# Patient Record
Sex: Male | Born: 1955
Health system: Southern US, Community
[De-identification: ages and names within clinical notes are randomized; demographics above are authoritative.]

## PROBLEM LIST (undated history)

## (undated) DIAGNOSIS — F0390 Unspecified dementia without behavioral disturbance: Secondary | ICD-10-CM

## (undated) DIAGNOSIS — F0280 Dementia in other diseases classified elsewhere without behavioral disturbance: Secondary | ICD-10-CM

## (undated) DIAGNOSIS — E039 Hypothyroidism, unspecified: Secondary | ICD-10-CM

## (undated) DIAGNOSIS — E785 Hyperlipidemia, unspecified: Secondary | ICD-10-CM

## (undated) DIAGNOSIS — Q909 Down syndrome, unspecified: Secondary | ICD-10-CM

## (undated) DIAGNOSIS — G309 Alzheimer's disease, unspecified: Secondary | ICD-10-CM

## (undated) DIAGNOSIS — D7589 Other specified diseases of blood and blood-forming organs: Secondary | ICD-10-CM

## (undated) DIAGNOSIS — R0602 Shortness of breath: Secondary | ICD-10-CM

## (undated) HISTORY — PX: TONSILLECTOMY: SUR1361

## (undated) HISTORY — DX: Unspecified dementia, unspecified severity, without behavioral disturbance, psychotic disturbance, mood disturbance, and anxiety: F03.90

## (undated) HISTORY — DX: Shortness of breath: R06.02

## (undated) HISTORY — PX: MULTIPLE TOOTH EXTRACTIONS: SHX2053

## (undated) HISTORY — DX: Dementia in other diseases classified elsewhere, unspecified severity, without behavioral disturbance, psychotic disturbance, mood disturbance, and anxiety: F02.80

## (undated) HISTORY — DX: Other specified diseases of blood and blood-forming organs: D75.89

## (undated) HISTORY — PX: CATARACT EXTRACTION, BILATERAL: SHX1313

## (undated) HISTORY — DX: Hyperlipidemia, unspecified: E78.5

## (undated) HISTORY — DX: Hypothyroidism, unspecified: E03.9

## (undated) HISTORY — DX: Alzheimer's disease, unspecified: G30.9

## (undated) HISTORY — DX: Down syndrome, unspecified: Q90.9

---

## 1998-07-06 ENCOUNTER — Ambulatory Visit (HOSPITAL_COMMUNITY): Admission: RE | Admit: 1998-07-06 | Discharge: 1998-07-06 | Payer: Self-pay | Admitting: Specialist

## 2002-11-24 ENCOUNTER — Emergency Department (HOSPITAL_COMMUNITY): Admission: EM | Admit: 2002-11-24 | Discharge: 2002-11-24 | Payer: Self-pay | Admitting: Emergency Medicine

## 2004-12-19 ENCOUNTER — Emergency Department (HOSPITAL_COMMUNITY): Admission: EM | Admit: 2004-12-19 | Discharge: 2004-12-20 | Payer: Self-pay | Admitting: Emergency Medicine

## 2005-05-08 ENCOUNTER — Ambulatory Visit: Payer: Self-pay | Admitting: Internal Medicine

## 2005-06-26 ENCOUNTER — Ambulatory Visit: Payer: Self-pay | Admitting: Internal Medicine

## 2005-12-03 DIAGNOSIS — D7589 Other specified diseases of blood and blood-forming organs: Secondary | ICD-10-CM

## 2005-12-03 HISTORY — PX: COLONOSCOPY: SHX174

## 2005-12-03 HISTORY — DX: Other specified diseases of blood and blood-forming organs: D75.89

## 2006-06-12 ENCOUNTER — Ambulatory Visit: Payer: Self-pay | Admitting: Internal Medicine

## 2006-07-17 ENCOUNTER — Ambulatory Visit: Payer: Self-pay | Admitting: Gastroenterology

## 2006-07-29 ENCOUNTER — Ambulatory Visit: Payer: Self-pay | Admitting: Internal Medicine

## 2006-08-07 ENCOUNTER — Ambulatory Visit: Payer: Self-pay | Admitting: Gastroenterology

## 2006-08-22 ENCOUNTER — Ambulatory Visit: Payer: Self-pay | Admitting: Internal Medicine

## 2006-11-04 ENCOUNTER — Ambulatory Visit: Payer: Self-pay | Admitting: Internal Medicine

## 2007-06-16 ENCOUNTER — Ambulatory Visit: Payer: Self-pay | Admitting: Internal Medicine

## 2007-06-16 DIAGNOSIS — Q909 Down syndrome, unspecified: Secondary | ICD-10-CM

## 2007-06-16 DIAGNOSIS — E039 Hypothyroidism, unspecified: Secondary | ICD-10-CM | POA: Insufficient documentation

## 2007-06-16 DIAGNOSIS — E785 Hyperlipidemia, unspecified: Secondary | ICD-10-CM | POA: Insufficient documentation

## 2007-06-20 ENCOUNTER — Encounter (INDEPENDENT_AMBULATORY_CARE_PROVIDER_SITE_OTHER): Payer: Self-pay | Admitting: *Deleted

## 2007-06-25 ENCOUNTER — Telehealth (INDEPENDENT_AMBULATORY_CARE_PROVIDER_SITE_OTHER): Payer: Self-pay | Admitting: *Deleted

## 2007-06-27 ENCOUNTER — Encounter (INDEPENDENT_AMBULATORY_CARE_PROVIDER_SITE_OTHER): Payer: Self-pay | Admitting: *Deleted

## 2008-02-17 ENCOUNTER — Encounter: Payer: Self-pay | Admitting: Internal Medicine

## 2008-02-19 ENCOUNTER — Encounter: Payer: Self-pay | Admitting: Internal Medicine

## 2008-03-29 ENCOUNTER — Telehealth (INDEPENDENT_AMBULATORY_CARE_PROVIDER_SITE_OTHER): Payer: Self-pay | Admitting: *Deleted

## 2008-06-09 ENCOUNTER — Ambulatory Visit: Payer: Self-pay | Admitting: Internal Medicine

## 2008-06-16 ENCOUNTER — Telehealth: Payer: Self-pay | Admitting: Internal Medicine

## 2008-06-17 ENCOUNTER — Encounter: Payer: Self-pay | Admitting: Internal Medicine

## 2008-07-21 ENCOUNTER — Telehealth: Payer: Self-pay | Admitting: Internal Medicine

## 2008-07-29 ENCOUNTER — Encounter: Payer: Self-pay | Admitting: Internal Medicine

## 2008-08-02 ENCOUNTER — Encounter: Payer: Self-pay | Admitting: Internal Medicine

## 2008-09-14 ENCOUNTER — Telehealth (INDEPENDENT_AMBULATORY_CARE_PROVIDER_SITE_OTHER): Payer: Self-pay | Admitting: *Deleted

## 2008-09-14 ENCOUNTER — Encounter: Payer: Self-pay | Admitting: Internal Medicine

## 2008-09-17 ENCOUNTER — Telehealth (INDEPENDENT_AMBULATORY_CARE_PROVIDER_SITE_OTHER): Payer: Self-pay | Admitting: *Deleted

## 2008-09-21 ENCOUNTER — Telehealth (INDEPENDENT_AMBULATORY_CARE_PROVIDER_SITE_OTHER): Payer: Self-pay | Admitting: *Deleted

## 2008-09-24 ENCOUNTER — Ambulatory Visit: Payer: Self-pay | Admitting: Internal Medicine

## 2008-09-30 ENCOUNTER — Telehealth (INDEPENDENT_AMBULATORY_CARE_PROVIDER_SITE_OTHER): Payer: Self-pay | Admitting: *Deleted

## 2008-11-16 ENCOUNTER — Ambulatory Visit: Payer: Self-pay | Admitting: Internal Medicine

## 2008-11-19 ENCOUNTER — Telehealth (INDEPENDENT_AMBULATORY_CARE_PROVIDER_SITE_OTHER): Payer: Self-pay | Admitting: *Deleted

## 2008-12-17 ENCOUNTER — Telehealth (INDEPENDENT_AMBULATORY_CARE_PROVIDER_SITE_OTHER): Payer: Self-pay | Admitting: *Deleted

## 2008-12-22 ENCOUNTER — Ambulatory Visit: Payer: Self-pay | Admitting: Internal Medicine

## 2009-06-23 ENCOUNTER — Telehealth (INDEPENDENT_AMBULATORY_CARE_PROVIDER_SITE_OTHER): Payer: Self-pay | Admitting: *Deleted

## 2009-06-24 ENCOUNTER — Encounter (INDEPENDENT_AMBULATORY_CARE_PROVIDER_SITE_OTHER): Payer: Self-pay | Admitting: *Deleted

## 2009-07-01 ENCOUNTER — Ambulatory Visit: Payer: Self-pay | Admitting: Internal Medicine

## 2009-07-01 DIAGNOSIS — R739 Hyperglycemia, unspecified: Secondary | ICD-10-CM

## 2009-07-04 ENCOUNTER — Encounter (INDEPENDENT_AMBULATORY_CARE_PROVIDER_SITE_OTHER): Payer: Self-pay | Admitting: *Deleted

## 2009-09-23 ENCOUNTER — Ambulatory Visit: Payer: Self-pay | Admitting: Family Medicine

## 2010-03-21 ENCOUNTER — Encounter: Payer: Self-pay | Admitting: Internal Medicine

## 2010-07-13 ENCOUNTER — Ambulatory Visit: Payer: Self-pay | Admitting: Internal Medicine

## 2010-11-17 ENCOUNTER — Encounter: Payer: Self-pay | Admitting: Internal Medicine

## 2010-12-31 LAB — CONVERTED CEMR LAB
ALT: 33 units/L (ref 0–53)
ALT: 47 units/L (ref 0–53)
AST: 23 units/L (ref 0–37)
AST: 24 units/L (ref 0–37)
Albumin: 3.4 g/dL — ABNORMAL LOW (ref 3.5–5.2)
Albumin: 3.7 g/dL (ref 3.5–5.2)
Alkaline Phosphatase: 71 units/L (ref 39–117)
Alkaline Phosphatase: 76 units/L (ref 39–117)
Alkaline Phosphatase: 80 units/L (ref 39–117)
BUN: 13 mg/dL (ref 6–23)
Basophils Absolute: 0 10*3/uL (ref 0.0–0.1)
Basophils Relative: 0.2 % (ref 0.0–1.0)
Basophils Relative: 1 % (ref 0–1)
Bilirubin, Direct: 0.1 mg/dL (ref 0.0–0.3)
Bilirubin, Direct: 0.2 mg/dL (ref 0.0–0.3)
CO2: 22 meq/L (ref 19–32)
Calcium: 8.8 mg/dL (ref 8.4–10.5)
Calcium: 9 mg/dL (ref 8.4–10.5)
Chloride: 103 meq/L (ref 96–112)
Cholesterol: 130 mg/dL (ref 0–200)
Cholesterol: 148 mg/dL (ref 0–200)
Cholesterol: 156 mg/dL (ref 0–200)
Creatinine, Ser: 0.95 mg/dL (ref 0.40–1.50)
Creatinine, Ser: 1 mg/dL (ref 0.4–1.5)
Eosinophils Absolute: 0.1 10*3/uL (ref 0.0–0.7)
Glucose, Bld: 93 mg/dL (ref 70–99)
HDL goal, serum: 40 mg/dL
HDL: 38 mg/dL — ABNORMAL LOW (ref >39.0)
HDL: 43.3 mg/dL (ref >39.0)
Hgb A1c MFr Bld: 5.9 % (ref 4.6–6.5)
LDL Cholesterol: 81 mg/dL (ref 0–99)
LDL Cholesterol: 93 mg/dL (ref 0–99)
MCHC: 33.8 g/dL (ref 30.0–36.0)
MCHC: 34 g/dL (ref 30.0–36.0)
MCV: 104.9 fL — ABNORMAL HIGH (ref 78.0–100.0)
Monocytes Absolute: 0.4 10*3/uL (ref 0.1–1.0)
Monocytes Relative: 7.7 % (ref 3.0–11.0)
Monocytes Relative: 9 % (ref 3–12)
Neutrophils Relative %: 69 % (ref 43–77)
PSA: 0.32 ng/mL (ref 0.10–4.00)
RBC: 4.41 M/uL (ref 4.22–5.81)
RBC: 4.45 M/uL (ref 4.22–5.81)
RDW: 14 % (ref 11.5–14.6)
TSH: 2.16 microintl units/mL (ref 0.35–5.50)
Total Bilirubin: 0.6 mg/dL (ref 0.3–1.2)
Total Bilirubin: 1 mg/dL (ref 0.3–1.2)
Total CHOL/HDL Ratio: 4.1
Total Protein: 6.8 g/dL (ref 6.0–8.3)
Total Protein: 7 g/dL (ref 6.0–8.3)
Triglycerides: 62 mg/dL (ref 0–149)
Triglycerides: 71 mg/dL (ref 0.0–149.0)
VLDL: 12 mg/dL (ref 0–40)
VLDL: 14.2 mg/dL (ref 0.0–40.0)
VLDL: 25 mg/dL (ref 0–40)

## 2011-01-04 NOTE — Miscellaneous (Signed)
Summary: Care Plan/[facility]  Care Plan/[facility]   Imported By: Lanelle Bal 03/28/2010 12:49:49  _____________________________________________________________________  External Attachment:    Type:   Image     Comment:   External Document

## 2011-01-04 NOTE — Assessment & Plan Note (Signed)
Summary: YEARLY//PH   Vital Signs:  Patient profile:   55 year old male Height:      61.75 inches Weight:      156.2 pounds BMI:     28.91 Temp:     98.4 degrees F oral Resp:     14 per minute BP sitting:   112 / 68  (left arm) Cuff size:   regular  Vitals Entered By: Shonna Chock CMA (July 13, 2010 11:15 AM) CC: Yearly with fasting labs    CC:  Yearly with fasting labs .  History of Present Illness: Here for Medicare AWV: 1.Risk factors based on Past M, S, F history: Hypothyroidism; Dyslipidemia 2.Physical Activities: see data 3.Depression/mood: issues denied 4.Hearing: whisper heard @6  ft 5.ADL's: no limitations 6.Fall Risk: none 7.Home Safety: no issues ( supervised [address] [facility]) 8.Height, weight, &visual acuity:fitted for new lenses 07/12/2010; old lenses lost; vision grossly intact 9.Counseling: none requested or indicated 10.Labs ordered based on risk factors: see Orders 11.Referral Coordination: none requested 12.  Care Plan: see Instructions 13.Cognitive Assessment: Down's Syndrome; oriented to date;unable to spell "WORLD " backwards; recall poor; mood & affect normal. Hyperlipidemia Follow-Up      This is a 55 year old man who presents for Hyperlipidemia follow-up.  The patient denies muscle aches, GI upset, abdominal pain, flushing, itching, constipation, diarrhea, and fatigue.  The patient denies the following symptoms: chest pain/pressure, exercise intolerance, dypsnea, palpitations, syncope, and pedal edema.  Compliance with medications (by patient report) has been near 100%.  Dietary compliance has been good.  Adjunctive measures currently used by the patient include ASA.    Preventive Screening-Counseling & Management  Alcohol-Tobacco     Alcohol drinks/day: 0     Smoking Status: never  Caffeine-Diet-Exercise     Caffeine use/day: occasional l cola     Diet Comments: regular diet     Does Patient Exercise: yes     Type of exercise: walking        Exercise (avg: min/session): <30     Times/week: 7  Hep-HIV-STD-Contraception     Dental Visit-last 6 months yes     Sun Exposure-Excessive: no  Safety-Violence-Falls     Seat Belt Use: yes     Firearms in the Home: no firearms in the home     Smoke Detectors: yes     Violence in the Home: no risk noted     Sexual Abuse: no     Fall Risk: none      Sexual History:  no relationships.        Drug Use:  never.        Blood Transfusions:  no.        Travel History:  no travel.    Allergies (verified): No Known Drug Allergies  Social History: Caffeine use/day:  occasional l cola Dental Care w/in 6 mos.:  yes Sun Exposure-Excessive:  no Seat Belt Use:  yes Fall Risk:  none Sexual History:  no relationships Drug Use:  never Blood Transfusions:  no  Review of Systems General:  Denies weight loss. Eyes:  Denies blurring, double vision, and vision loss-both eyes. ENT:  Denies difficulty swallowing and hoarseness. GI:  Denies bloody stools, dark tarry stools, and indigestion. Derm:  Denies changes in nail beds, dryness, and hair loss. Neuro:  Denies numbness and tingling. Endo:  Denies cold intolerance, excessive hunger, excessive thirst, excessive urination, and heat intolerance.  Physical Exam  General:  Down's stigmata,in no acute distress; appropriate  and cooperative throughout examination Eyes:  No corneal or conjunctival inflammation noted.No lid lag Mouth:  Oral mucosa and oropharynx without lesions or exudates.  Teeth in good repair. Lips dry Neck:  No deformities, masses, or tenderness noted. Lungs:  Normal respiratory effort, chest expands symmetrically. Lungs are clear to auscultation, no crackles or wheezes. Heart:  regular rhythm, no murmur, no gallop, no rub, no JVD, no HJR, and bradycardia.   Abdomen:  Bowel sounds positive,abdomen soft and non-tender without masses, organomegaly or hernias noted. Extremities:  No clubbing, cyanosis, edema, or deformity  noted with normal full range of motion of all joints.   Digits short Neurologic:  strength normal in all extremities and DTRs symmetrical and normal.   Skin:  Intact without suspicious lesions or rashes. Lipis dry as described Cervical Nodes:  No lymphadenopathy noted Axillary Nodes:  No palpable lymphadenopathy Psych:   normally interactive but demeanor  subdued.     Impression & Recommendations:  Problem # 1:  PREVENTIVE HEALTH CARE (ICD-V70.0)  Orders: [facility] -Subsequent Annual Wellness Visit [phone])  Problem # 2:  HYPERLIPIDEMIA (ICD-272.4)  His updated medication list for this problem includes:    Simvastatin 20 Mg Tabs (Simvastatin) .Marland Kitchen... 1 by mouth qhs  Orders: EKG w/ Interpretation (93000) Venipuncture (46962) TLB-Lipid Panel (80061-LIPID) TLB-Hepatic/Liver Function Pnl (80076-HEPATIC) Specimen Handling (95284)  Problem # 3:  HYPOTHYROIDISM (ICD-244.9)  His updated medication list for this problem includes:    Levothyroxine Sodium 50 Mcg Tabs (Levothyroxine sodium) .Marland Kitchen... 1 by mouth once daily  Orders: Venipuncture (13244) TLB-TSH (Thyroid Stimulating Hormone) (84443-TSH) Specimen Handling (01027)  Problem # 4:  DOWN SYNDROME (ICD-758.0)  Problem # 5:  FASTING HYPERGLYCEMIA (ICD-790.29) PMH of Orders: Venipuncture (25366) TLB-A1C / Hgb A1C (Glycohemoglobin) (83036-A1C)  Complete Medication List: 1)  Levothyroxine Sodium 50 Mcg Tabs (Levothyroxine sodium) .Marland Kitchen.. 1 by mouth once daily 2)  Simvastatin 20 Mg Tabs (Simvastatin) .Marland Kitchen.. 1 by mouth qhs  Patient Instructions: 1)  Please have Administration clarify Standard of Care concerning Colonoscopy Screening for Group Home patients older than 50. 2)  It is important that you exercise regularly at least 20 minutes 5 times a week. If you develop chest pain, have severe difficulty breathing, or feel very tired , stop exercising immediately and seek medical attention. 3)  Take an  81 mg coated Aspirin every  day. Prescriptions: SIMVASTATIN 20 MG  TABS (SIMVASTATIN) 1 by mouth qhs  #90 x 3   Entered and Authorized by:   Marga Melnick MD   Signed by:   Marga Melnick MD on 07/13/2010   Method used:   Print then Give to Patient   RxID:   [id number] LEVOTHYROXINE SODIUM 50 MCG TABS (LEVOTHYROXINE SODIUM) 1 by mouth once daily  #90 x 3   Entered and Authorized by:   Marga Melnick MD   Signed by:   Marga Melnick MD on 07/13/2010   Method used:   Print then Give to Patient   RxID:   [id number]

## 2011-01-04 NOTE — Miscellaneous (Signed)
Summary: Med Orders/[facility]  Med Orders/[facility]   Imported By: Lanelle Bal 11/28/2010 10:11:26  _____________________________________________________________________  External Attachment:    Type:   Image     Comment:   External Document

## 2011-02-21 ENCOUNTER — Encounter: Payer: Self-pay | Admitting: Internal Medicine

## 2011-02-22 ENCOUNTER — Ambulatory Visit (INDEPENDENT_AMBULATORY_CARE_PROVIDER_SITE_OTHER): Payer: Medicare Other | Admitting: Internal Medicine

## 2011-02-22 ENCOUNTER — Encounter: Payer: Self-pay | Admitting: Internal Medicine

## 2011-02-22 VITALS — BP 120/86 | HR 60 | Ht 61.25 in | Wt 161.8 lb

## 2011-02-22 DIAGNOSIS — E039 Hypothyroidism, unspecified: Secondary | ICD-10-CM

## 2011-02-22 DIAGNOSIS — R413 Other amnesia: Secondary | ICD-10-CM

## 2011-02-22 DIAGNOSIS — E785 Hyperlipidemia, unspecified: Secondary | ICD-10-CM

## 2011-02-22 DIAGNOSIS — Q909 Down syndrome, unspecified: Secondary | ICD-10-CM

## 2011-02-22 NOTE — Patient Instructions (Signed)
[name] will be referred to Dr. Chriss Czar, neurologist in [facility]. Copies of the laboratory studies will be sent to  the facility and to Dr. Hyacinth Meeker.

## 2011-02-22 NOTE — Progress Notes (Signed)
  Subjective:    Patient ID: SEDRIC GUIA [facility], male    DOB: 03-24-1956, 55 y.o.   MRN: [id number]  HPI the patient is brought in by the facility staff member , Claris Gower , who relates concerns by the staff as to Billy's mental status . The staff is concerned that this may represent expected aging complications of his Down's syndrome.  They feel for at least the last month at times he's actually seemed "lost" & will ask the same question  Repeatedly. Bili denies these symptoms.   He has had no head trauma and there have been no new localizing neurologic signs according staff member.   He has hypothyroidism and is on levothyroxin. Additionally he is on simvastatin for dyslipidemia    Review of Systems he denies headaches; numbness and tingling; weakness; tremors; or falling. He denies anxiety, depression, or mood swings.     Objective:   Physical Exam he exhibits the classic physical  appearance  of Down syndrome.    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 It is difficult to assess extraocular motion because of difficulty following commands. He does have intermittent nystagmus greater on the right than the left.    he has an eczematoid dermatitis with mild erythema and drying  Especially noted of the face.    heart rhythm is regular; he has a grade 1/2-1 systolic murmur. No carotid bruits are noted.   deep tendon reflexes are equal and normal. It is difficult to assess strength as effort is questionable.    his gait is slow and methodical. It is slightly broad-based.    there is no lymphadenopathy about the head neck or axilla. Thyroid is normal to palpation.    affect is flat. This is in direct contrast to his previous demeanor which was interactive and jovial. In the past he is typically had a puzzle book or other interactive material  which he completes during the interview.    Mini-Mental Status exam was completed ; he scored 13/30. He was unable to give the date.  There was no immediate recall of 3 items. He could not subtract 7 from 100. He could not repeat the phrase "no if ands or buts ".    when written instructions to close his eyes were shown to him he read it but did not comply. When asked to write a sentence he wrote "test ".    he was unable to draw intersecting pentagons.       Assessment & Plan:   #1 probable dementia     #2 Down syndrome    #3 hypothyroidism     #4 hyperlipidemia    Plan : #1 labs will be drawn to assess the dementia ; he will be referred to neurology.   #2 lipids , ALT, and AST will be collected

## 2011-02-23 LAB — HEPATIC FUNCTION PANEL
AST: 26 U/L (ref 0–37)
Albumin: 3.7 g/dL (ref 3.5–5.2)
Total Bilirubin: 0.4 mg/dL (ref 0.3–1.2)

## 2011-02-23 LAB — LIPID PANEL
HDL: 52.9 mg/dL (ref >39.00)
LDL Cholesterol: 68 mg/dL (ref 0–99)
Total CHOL/HDL Ratio: 3

## 2011-02-23 LAB — RPR

## 2011-02-23 LAB — TSH: TSH: 1.85 u[IU]/mL (ref 0.35–5.50)

## 2011-02-26 LAB — METHYLMALONIC ACID, SERUM: Methylmalonic Acid, Quantitative: 93 nmol/L (ref 87–318)

## 2011-02-28 ENCOUNTER — Telehealth: Payer: Self-pay

## 2011-02-28 NOTE — Telephone Encounter (Signed)
This level is extremely low although some just barely in the normal range. I would recommend B12 injections 1000 units weekly x4 then monthly thereafter. This level could be repeaed after 6 months of supplementation. (Reason for Call), unable to leave message- will try again later

## 2011-02-28 NOTE — Telephone Encounter (Signed)
Left message on voicemail for patient to return call when avaliable. Reason for call- discuss labs and set up appointment for b12 injection if patient agrees

## 2011-03-01 NOTE — Telephone Encounter (Signed)
Patient's care giver Gwyndolyn Saxon called and was given information about labs. Drinda Butts will check with her supervisor and see when she will be able to bring patient in for his b12 injections and call us back

## 2011-03-06 ENCOUNTER — Ambulatory Visit (INDEPENDENT_AMBULATORY_CARE_PROVIDER_SITE_OTHER): Payer: Medicare Other | Admitting: *Deleted

## 2011-03-06 DIAGNOSIS — E538 Deficiency of other specified B group vitamins: Secondary | ICD-10-CM

## 2011-03-06 MED ORDER — CYANOCOBALAMIN 1000 MCG/ML IJ SOLN
1000.0000 ug | Freq: Once | INTRAMUSCULAR | Status: AC
  Administered 2011-03-06: 1000 ug via INTRAMUSCULAR

## 2011-03-15 ENCOUNTER — Ambulatory Visit (INDEPENDENT_AMBULATORY_CARE_PROVIDER_SITE_OTHER): Payer: Medicare Other | Admitting: *Deleted

## 2011-03-15 DIAGNOSIS — E538 Deficiency of other specified B group vitamins: Secondary | ICD-10-CM

## 2011-03-15 MED ORDER — CYANOCOBALAMIN 1000 MCG/ML IJ SOLN
1000.0000 ug | Freq: Once | INTRAMUSCULAR | Status: AC
  Administered 2011-03-15: 1000 ug via INTRAMUSCULAR

## 2011-03-22 ENCOUNTER — Ambulatory Visit (INDEPENDENT_AMBULATORY_CARE_PROVIDER_SITE_OTHER): Payer: Medicare Other | Admitting: *Deleted

## 2011-03-22 DIAGNOSIS — E538 Deficiency of other specified B group vitamins: Secondary | ICD-10-CM

## 2011-03-22 MED ORDER — CYANOCOBALAMIN 1000 MCG/ML IJ SOLN
1000.0000 ug | Freq: Once | INTRAMUSCULAR | Status: AC
  Administered 2011-03-22: 1000 ug via INTRAMUSCULAR

## 2011-03-29 ENCOUNTER — Ambulatory Visit (INDEPENDENT_AMBULATORY_CARE_PROVIDER_SITE_OTHER): Payer: Medicare Other | Admitting: *Deleted

## 2011-03-29 DIAGNOSIS — E538 Deficiency of other specified B group vitamins: Secondary | ICD-10-CM

## 2011-03-29 MED ORDER — CYANOCOBALAMIN 1000 MCG/ML IJ SOLN
1000.0000 ug | Freq: Once | INTRAMUSCULAR | Status: AC
  Administered 2011-03-29: 1000 ug via INTRAMUSCULAR

## 2011-04-20 NOTE — Assessment & Plan Note (Signed)
[facility]                           GASTROENTEROLOGY OFFICE NOTE   DAMARCUS, REGGIO                    MRN:          [id number]  DATE:07/17/2006                            DOB:          October 26, 1956    REFERRING PHYSICIAN:  Dr. Marga Melnick.   REASON FOR REFERRAL:  Colorectal cancer screening.   HISTORY OF PRESENT ILLNESS:  [name] is a 55 year old white male who  is a resident of a group home.  He has no gastrointestinal complaints and  specifically denies any change in his bowel habits, melena, hematochezia,  change in stool caliber, constipation, diarrhea or weight loss.  There is no  family history of colon cancer, colon polyps or inflammatory bowel disease.  He has recently seen Dr. Alwyn Ren for a physical examination and he was  recommended to have a colonoscopy.  Digital rectal examination was  unremarkable with hemoccult negative stool.   LABORATORY DATA:  Blood work from June 12, 2006 showed a slightly low white  count of 4.3, an elevated MCV at 104.  Minimally elevated glucose at 106 and  a normal PSA.   PAST MEDICAL HISTORY:  1. Hyperlipidemia.  2. Hypothyroidism.  3. Down's Syndrome.   PAST SURGICAL HISTORY:  Negative.   CURRENT MEDICATIONS:  Listed on the chart, updated and reviewed.   ALLERGIES:  No known drug allergies.   SOCIAL HISTORY:  Per the hand written-form.   REVIEW OF SYSTEMS:  Entirely negative per the hand-written form.   PHYSICAL EXAMINATION:  GENERAL:  Well-developed, well-nourished white male  with typical features of Down's Syndrome.  VITAL SIGNS:  Height 5 feet.  Weight 174.2.  Blood pressure 134/82.  Pulse  84 and regular.  HEENT:  Anicteric sclerae.  Oropharynx clear.  CHEST:  Clear to auscultation bilaterally.  CARDIAC:  Regular rate and rhythm without murmurs.  ABDOMEN:  Soft, non-tender, non-distended.  Normal active bowel sounds.  No  palpable organomegaly, masses or hernias.  RECTAL:   Rectal examination deferred to time of colonoscopy, recent exam by  Dr. Alwyn Ren showed no lesions with hemoccult negative stool.  EXTREMITIES:  No clubbing, cyanosis or edema.  NEUROLOGICAL:  Alert and oriented x3.  Grossly non-focal.   ASSESSMENT:  1. Average risk for colorectal cancer.  2. Risks, benefits and alternatives to colonoscopy with possible biopsy      and possible polypectomy discussed with the patient and he consents to      proceed.  3. This will be scheduled electively.                                   Venita Lick. Pleas Koch., MD, [facility]   MTS/MedQ  DD:  07/17/2006  DT:  07/17/2006  Job #:  [id number]   cc:   Titus Dubin. Alwyn Ren, MD, FACP, [facility]

## 2011-04-20 NOTE — Assessment & Plan Note (Signed)
[facility]                                   ON-CALL NOTE   Travis Sampson, Travis Sampson                   MRN:          [id number]  DATE:07/28/2006                            DOB:          Jul 18, 1956    TIME CALLED:  2:21 p.m.   TELEPHONE NUMBER:  [phone]   CHIEF COMPLAINT:  Painful and swollen foot.   I tried this phone number several times.  It was not in operation.  I called  the answering service and told them to please redirect the call with a  different number to me if they call back and they did not.                                   Marne A. Tower, MD   MAT/MedQ  DD:  07/29/2006  DT:  07/29/2006  Job #:  [id number]   cc:   Titus Dubin. Alwyn Ren, MD, FACP, [facility]

## 2011-05-01 ENCOUNTER — Ambulatory Visit (INDEPENDENT_AMBULATORY_CARE_PROVIDER_SITE_OTHER): Payer: Medicare Other | Admitting: *Deleted

## 2011-05-01 DIAGNOSIS — E538 Deficiency of other specified B group vitamins: Secondary | ICD-10-CM

## 2011-05-01 MED ORDER — CYANOCOBALAMIN 1000 MCG/ML IJ SOLN
1000.0000 ug | Freq: Once | INTRAMUSCULAR | Status: AC
  Administered 2011-05-01: 1000 ug via INTRAMUSCULAR

## 2011-06-27 DIAGNOSIS — Z0279 Encounter for issue of other medical certificate: Secondary | ICD-10-CM

## 2011-07-13 ENCOUNTER — Encounter (INDEPENDENT_AMBULATORY_CARE_PROVIDER_SITE_OTHER): Payer: Medicare Other | Admitting: *Deleted

## 2011-07-13 ENCOUNTER — Ambulatory Visit (INDEPENDENT_AMBULATORY_CARE_PROVIDER_SITE_OTHER): Payer: Medicare Other | Admitting: Family Medicine

## 2011-07-13 ENCOUNTER — Encounter: Payer: Self-pay | Admitting: Family Medicine

## 2011-07-13 VITALS — BP 128/80 | Temp 98.0°F | Wt 158.0 lb

## 2011-07-13 DIAGNOSIS — R600 Localized edema: Secondary | ICD-10-CM | POA: Insufficient documentation

## 2011-07-13 DIAGNOSIS — M79609 Pain in unspecified limb: Secondary | ICD-10-CM

## 2011-07-13 DIAGNOSIS — R609 Edema, unspecified: Secondary | ICD-10-CM

## 2011-07-13 DIAGNOSIS — M7989 Other specified soft tissue disorders: Secondary | ICD-10-CM

## 2011-07-13 NOTE — Assessment & Plan Note (Signed)
Pt's unilateral edema is concerning for DVT.  Get [place] to r/o.  Reviewed supportive care and red flags that should prompt return.  Pt expressed understanding and is in agreement w/ plan.

## 2011-07-13 NOTE — Progress Notes (Signed)
  Subjective:    Patient ID: Travis Sampson [facility], male    DOB: June 01, 1956, 55 y.o.   MRN: [id number]  HPI R leg swelling- first noticed 'a few days ago'.  Less swollen today.  Pt reports pain w/ swelling if weight bearing.  Some limited mobility.  No recent injury.  No CP, SOB.   Review of Systems For ROS see HPI     Objective:   Physical Exam  Vitals reviewed. Constitutional: He appears well-developed and well-nourished. No distress.  Cardiovascular: Normal rate, regular rhythm, normal heart sounds and intact distal pulses.   Pulmonary/Chest: Effort normal and breath sounds normal. No respiratory distress. He has no wheezes. He has no rales.  Musculoskeletal: He exhibits edema (R LE edema from mid shin down- +1, no edema on L). He exhibits no tenderness.  Skin: Skin is warm and dry. No erythema.          Assessment & Plan:

## 2011-07-13 NOTE — Patient Instructions (Signed)
We'll notify you of your ultrasound result Make sure you ice and elevate your leg to help with the swelling If the swelling continues into next week- please call for a follow up appt Call with any questions or concerns Hang in there!

## 2011-07-16 ENCOUNTER — Encounter: Payer: Self-pay | Admitting: Family Medicine

## 2011-07-18 ENCOUNTER — Ambulatory Visit: Payer: Medicare Other | Admitting: Family Medicine

## 2011-07-18 ENCOUNTER — Ambulatory Visit (INDEPENDENT_AMBULATORY_CARE_PROVIDER_SITE_OTHER): Payer: Medicare Other | Admitting: Family Medicine

## 2011-07-18 ENCOUNTER — Encounter: Payer: Self-pay | Admitting: Family Medicine

## 2011-07-18 VITALS — BP 134/84 | Temp 98.6°F | Wt 158.4 lb

## 2011-07-18 DIAGNOSIS — R609 Edema, unspecified: Secondary | ICD-10-CM

## 2011-07-18 DIAGNOSIS — R600 Localized edema: Secondary | ICD-10-CM

## 2011-07-18 LAB — BASIC METABOLIC PANEL
BUN: 12 mg/dL (ref 6–23)
CO2: 29 mEq/L (ref 19–32)
Calcium: 8.6 mg/dL (ref 8.4–10.5)
Chloride: 107 mEq/L (ref 96–112)
Creatinine, Ser: 0.9 mg/dL (ref 0.4–1.5)
Glucose, Bld: 93 mg/dL (ref 70–99)

## 2011-07-18 MED ORDER — FUROSEMIDE 20 MG PO TABS: 20.0000 mg | ORAL_TABLET | Freq: Every day | ORAL | Status: DC

## 2011-07-18 NOTE — Progress Notes (Signed)
  Subjective:    Patient ID: DRAVON NOTT [facility], male    DOB: 1956/02/10, 55 y.o.   MRN: [id number]  HPI Edema- R leg remains swollen despite elevation and ice.  [place] last week was negative for DVT.  No redness or warmth to the leg.  Pt reports it is painful when standing.  No swelling of L leg.  Denies SOB, CP.   Review of Systems For ROS see HPI     Objective:   Physical Exam Vitals reviewed. Constitutional: He appears well-developed and well-nourished. No distress.  Cardiovascular: Normal rate, regular rhythm, normal heart sounds and intact distal pulses.   Pulmonary/Chest: Effort normal and breath sounds normal. No respiratory distress. He has no wheezes. He has no rales.  Musculoskeletal: He exhibits edema (R LE edema from mid shin down- +1, no edema on L). He exhibits no tenderness.  Skin: Skin is warm and dry. No erythema.         Assessment & Plan:

## 2011-07-18 NOTE — Patient Instructions (Signed)
Please schedule w/ Dr Alwyn Ren in 1-2 weeks to recheck labs and swelling Start the Lasix daily for swelling Continue to elevate as much as possible You can continue your normal routine Call with any questions or concerns Hang in there!

## 2011-07-19 ENCOUNTER — Telehealth: Payer: Self-pay

## 2011-07-19 NOTE — Telephone Encounter (Signed)
Pt.notified

## 2011-07-19 NOTE — Telephone Encounter (Signed)
Message copied by Beverely Low on Thu Jul 19, 2011 10:52 AM ------      Message from: Sheliah Hatch      Created: Wed Jul 18, 2011  4:50 PM       Pt's Cr and K+ are normal.  Should start lasix as discussed.  Will need repeat BMP at f/u w/ Dr Alwyn Ren

## 2011-07-22 NOTE — Assessment & Plan Note (Signed)
Exam is unchanged from Friday.  Doppler was negative for DVT.  No signs of CHF- no pulmonary congestion, bilateral swelling, no SOB.  Start Lasix.  Pt to return to evaluate edema and recheck BMP after starting lasix.  Pt and caregiver express understanding.

## 2011-07-23 ENCOUNTER — Telehealth: Payer: Self-pay

## 2011-07-23 NOTE — Telephone Encounter (Signed)
Caregiver Drinda Butts states that pt is already taking Lasix and it was received from the pharmacy on file. Disregard fax from pharmacy

## 2011-08-01 ENCOUNTER — Ambulatory Visit (INDEPENDENT_AMBULATORY_CARE_PROVIDER_SITE_OTHER): Payer: Medicare Other | Admitting: Internal Medicine

## 2011-08-01 ENCOUNTER — Encounter: Payer: Self-pay | Admitting: Internal Medicine

## 2011-08-01 VITALS — BP 112/78 | Temp 98.5°F | Wt 154.0 lb

## 2011-08-01 DIAGNOSIS — R600 Localized edema: Secondary | ICD-10-CM

## 2011-08-01 DIAGNOSIS — R609 Edema, unspecified: Secondary | ICD-10-CM

## 2011-08-01 NOTE — Patient Instructions (Addendum)
Wear the support hose on the right leg. If the swelling persists or progresses; assessment by Orthopedics for possible musculoskeletal injury is recommended. Avoid ingestion of  excess salt/sodium.Cook with pepper & other spices . Use the salt substitute "No Salt"(unless your potassium has been elevated) OR the Mrs Sharilyn Sites products to season food @ the table. Avoid foods which taste salty or "vinegary" as their sodium contentet will be high.

## 2011-08-01 NOTE — Progress Notes (Signed)
  Subjective:    Patient ID: Travis Sampson [facility], male    DOB: 01-25-1956, 55 y.o.   MRN: [id number]  HPI The June 15 record and labs were reviewed; renal function was normal and there was no evidence of deep venous thrombosis. The edema has improved with low dose prednisone, 20 mg daily. He denies excess salt intake. He is not on amlodipine.    Review of Systems   He denies chest pain, palpitations, paroxysmal nocturnal dyspnea. He did have a fall @ camp but  this predated the edema in the right ankle by 5 months.     Objective:   Physical Exam he is in no acute distress.  Chest is clear without rales, rhonchi, or wheezes  He has a slow S4 with slight slurring. No significant murmurs.  There is no neck vein distention at 30. He has no hepatojugular reflux.  Abdomen is nontender without organomegaly or masses.  All pulses are intact no bruits or ischemic changes.  Stasis dermatitis changes or noted over the right shin and he does have slight edema/fusiform change of the right ankle  He denies pain to palpation or range of motion of the right ankle and foot        Assessment & Plan:  #1 he has localized edema of the right ankle and foot which is non-pitting. There is no suggestion of a cardiac, renal, or hepatic cause of this. These changes are most likely related to venous insufficiency due to previous muscle skeletal injury in the R ankle/ foot.  Plan: Support hose for  the right lower extremity is recommended. If symptoms persist or progress, I recommend orthopedic assessment of the right ankle/foot.

## 2011-09-07 ENCOUNTER — Telehealth: Payer: Self-pay | Admitting: *Deleted

## 2011-09-07 NOTE — Telephone Encounter (Signed)
Pt was given Rx for support hose to help with swelling in legs. Pt swelling has resolve and Pt does not want to wear hose anymore. Facility would like to know if Pt is to continue to wear hose despite swelling resolving or if it can be used on a as needed basis. Order needed to D/C hose or to PRN. Please advise

## 2011-09-07 NOTE — Telephone Encounter (Signed)
Leave the stockings off unless the edema recurs. They can be reapplied as needed

## 2011-09-07 NOTE — Telephone Encounter (Signed)
Verbally inform Arlys John of dr hopper orders

## 2011-09-27 ENCOUNTER — Ambulatory Visit (INDEPENDENT_AMBULATORY_CARE_PROVIDER_SITE_OTHER): Payer: Medicare Other | Admitting: Internal Medicine

## 2011-09-27 ENCOUNTER — Encounter: Payer: Self-pay | Admitting: Internal Medicine

## 2011-09-27 VITALS — BP 122/80 | HR 53 | Temp 98.6°F | Resp 12 | Ht 61.25 in | Wt 157.8 lb

## 2011-09-27 DIAGNOSIS — R9431 Abnormal electrocardiogram [ECG] [EKG]: Secondary | ICD-10-CM

## 2011-09-27 DIAGNOSIS — Q909 Down syndrome, unspecified: Secondary | ICD-10-CM

## 2011-09-27 DIAGNOSIS — Z23 Encounter for immunization: Secondary | ICD-10-CM

## 2011-09-27 DIAGNOSIS — Z0000 Encounter for general adult medical examination without abnormal findings: Secondary | ICD-10-CM

## 2011-09-27 DIAGNOSIS — Z79899 Other long term (current) drug therapy: Secondary | ICD-10-CM

## 2011-09-27 DIAGNOSIS — E785 Hyperlipidemia, unspecified: Secondary | ICD-10-CM

## 2011-09-27 DIAGNOSIS — E039 Hypothyroidism, unspecified: Secondary | ICD-10-CM

## 2011-09-27 DIAGNOSIS — R7309 Other abnormal glucose: Secondary | ICD-10-CM

## 2011-09-27 NOTE — Progress Notes (Signed)
Subjective:    Patient ID: Travis Sampson [facility], male    DOB: [birth year], 55 y.o.   MRN: [id number]  HPI  Travis Sampson is here for a physical; he has no acute issues.      Review of Systems  Family history of premature CAD/ MI: no .  Nutrition: no diet .  Exercise: occasionally . Diabetes : PMH of fasting glycemia . No polyuria, phagia or uria. HTN: no.  Weight :  stable. ROS: fatigue: no ; chest pain : no ;claudication: no; palpitations: no; abd pain/bowel changes: no ; myalgias:no;  syncope : no ; skin changes: no.       Objective:   Physical Exam Gen.: Healthy and well-nourished in appearance. Alert, appropriate and cooperative throughout exam. Short stature & stigmata of Down's Head: Normocephalic without obvious abnormalities Eyes: No corneal or conjunctival inflammation noted. Vision grossly normal with lenses. Ptosis bilaterally Ears: External  ear exam reveals no significant lesions or deformities. Canals clear .TMs normal. Hearing is grossly normal bilaterally. Nose: External nasal exam reveals no deformity or inflammation. Nasal mucosa are pink and moist. No lesions or exudates noted.   Mouth: Oral mucosa and oropharynx reveal no lesions or exudates. Teeth in good repair. Neck: No deformities, masses, or tenderness noted.  Thyroid normal. Some webbing. Lungs: Normal respiratory effort; chest expands symmetrically. Lungs are clear to auscultation without rales, wheezes, or increased work of breathing. Heart: Normal rate and rhythm. Normal S1 and S2. No gallop, click, or rub. S 4 with slurring; no significant murmur. Abdomen: Bowel sounds normal; abdomen soft and nontender. No masses, organomegaly or hernias noted. Genitalia/DRE : genital exam negative.No DRE ; PSA has been < 1.0 ( 0.57 in 2006 & 0.84 in 2007)   .                                                                                   Musculoskeletal/extremities: No deformity or scoliosis noted of  the thoracic or lumbar  spine. No clubbing, cyanosis, edema, or deformity noted. Range of motion  normal .Tone & strength  normal.Joints normal. Nail health  good. Vascular: Carotid, radial artery, dorsalis pedis and  posterior tibial pulses are full and equal. No bruits present. Neurologic: Alert and oriented x3. Deep tendon reflexes symmetrical and normal.          Skin: Intact without suspicious lesions or rashes, but some facial drying. Lymph: No cervical, axillary, or inguinal lymphadenopathy present. Psych: Mood and affect are normal. Normally interactive                                                                                         Assessment & Plan:  #1 comprehensive physical exam; no acute findings #2 see Problem List with Assessments & Recommendations EKG:SB, loss of T voltage in  lateral leads. Cardiology  assessment needed because of age, Down's Syndrome Plan: see Orders

## 2011-09-27 NOTE — Patient Instructions (Addendum)
Preventive Health Care: Exercise at least 30-45 minutes a day,  3-4 days a week once cleared by the Cardiologist.  Eat a low-fat diet with lots of fruits and vegetables, up to 7-9 servings per day. Consume less than 40 grams of sugar per day from foods & drinks with High Fructose Corn Sugar as # 1,2,3 or # 4 on label. Use Eucerin or another moisturizing agent such as Aveeno daily Moisturizing Lotion  twice a day  for the drying of the skin.

## 2011-09-28 LAB — BASIC METABOLIC PANEL
BUN: 12 mg/dL (ref 6–23)
Calcium: 8.5 mg/dL (ref 8.4–10.5)
Creatinine, Ser: 1 mg/dL (ref 0.4–1.5)
GFR: 85.87 mL/min (ref >60.00)
Glucose, Bld: 84 mg/dL (ref 70–99)
Potassium: 3.9 mEq/L (ref 3.5–5.1)

## 2011-09-28 LAB — CBC WITH DIFFERENTIAL/PLATELET
Basophils Absolute: 0 10*3/uL (ref 0.0–0.1)
Eosinophils Relative: 1.9 % (ref 0.0–5.0)
HCT: 45.9 % (ref 39.0–52.0)
Lymphs Abs: 1 10*3/uL (ref 0.7–4.0)
Monocytes Relative: 20.3 % — ABNORMAL HIGH (ref 3.0–12.0)
Neutrophils Relative %: 46.6 % (ref 43.0–77.0)
Platelets: 179 10*3/uL (ref 150.0–400.0)
RDW: 14.9 % — ABNORMAL HIGH (ref 11.5–14.6)
WBC: 3.2 10*3/uL — ABNORMAL LOW (ref 4.5–10.5)

## 2011-09-28 LAB — LIPID PANEL
Cholesterol: 136 mg/dL (ref 0–200)
Triglycerides: 69 mg/dL (ref 0.0–149.0)
VLDL: 13.8 mg/dL (ref 0.0–40.0)

## 2011-09-28 LAB — HEPATIC FUNCTION PANEL
ALT: 20 U/L (ref 0–53)
Total Bilirubin: 0.6 mg/dL (ref 0.3–1.2)

## 2011-09-28 LAB — TSH: TSH: 1.35 u[IU]/mL (ref 0.35–5.50)

## 2011-09-28 LAB — HEMOGLOBIN A1C: Hgb A1c MFr Bld: 6 % (ref 4.6–6.5)

## 2011-10-04 ENCOUNTER — Institutional Professional Consult (permissible substitution): Payer: Medicare Other | Admitting: Cardiology

## 2011-10-16 ENCOUNTER — Ambulatory Visit (INDEPENDENT_AMBULATORY_CARE_PROVIDER_SITE_OTHER): Payer: Medicare Other | Admitting: Cardiovascular Disease

## 2011-10-16 ENCOUNTER — Encounter: Payer: Self-pay | Admitting: Cardiovascular Disease

## 2011-10-16 VITALS — BP 136/84 | HR 60 | Ht 60.0 in | Wt 157.1 lb

## 2011-10-16 DIAGNOSIS — R9431 Abnormal electrocardiogram [ECG] [EKG]: Secondary | ICD-10-CM

## 2011-10-16 NOTE — Progress Notes (Signed)
[name] [facility] Date of Birth  [birth year] [facility] [address]    [address] [place], [place]  [address] [phone]  Fax  [phone]  History of Present Illness:  Travis Sampson is a 13 old gentleman with a history of Downs syndrome,  Hypertension,and hypothyroidism who is referred here with an abnormal EKG. He's not had any complaints. He's able to do all of his normal activities without any chest pain or shortness of breath. He's not all that active but he does work at [employer] as a Holiday representative.  Review the EKG reveals that he has sinus bradycardia. He had slight T-wave flattening on a few beats of the anterior leads but in subsequent beats the EKG tracing is normal.   Current Outpatient Prescriptions on File Prior to Visit  Medication Sig Dispense Refill  . aspirin 81 MG tablet Take 81 mg by mouth daily.        Marland Kitchen levothyroxine (SYNTHROID, LEVOTHROID) 50 MCG tablet Take 50 mcg by mouth daily.        . simvastatin (ZOCOR) 20 MG tablet Take 20 mg by mouth at bedtime.          No Known Allergies  Past Medical History  Diagnosis Date  . Down's syndrome   . Hyperlipidemia   . Hypothyroidism   . Macrocytosis 2007    MCV 104    Past Surgical History  Procedure Date  . Cataract extraction, bilateral   . Tonsillectomy   . Colonoscopy 2007    internal hemorrhoids    History  Smoking status  . Never Smoker   Smokeless tobacco  . Not on file    History  Alcohol Use No    Family History  Problem Relation Age of Onset  . Stroke Father   . Lung cancer Sister   . Heart attack Neg Hx     Reviw of Systems:  Reviewed in the HPI.  All other systems are negative.  Physical Exam: BP 136/84  Pulse 60  Ht 5' (1.524 m)  Wt 157 lb 1.9 oz (71.269 kg)  BMI 30.69 kg/m2 The patient is alert and oriented x 3.  The mood and affect are normal.   Skin: warm and dry.  Color is normal.    HEENT:   [place] /AT Downs appearance, normal carotids, no JVD  Lungs: clear    Heart: RR, no murmurs    Abdomen: non tender , + BS  Extremities:  No c/c/e  Neuro:  C/W Down's   O/W non focal  ECG: Sinus brady, mild T wave abn.  Assessment / Plan:

## 2011-10-16 NOTE — Assessment & Plan Note (Signed)
His EKG is overall only minimally abnormal. I think that it actually is within normal limits and I do not think that he needs any additional follow up. He's not having any cardiac complaints. I seen again on as-needed basis.

## 2011-10-16 NOTE — Patient Instructions (Signed)
Your physician recommends that you schedule a follow-up appointment in: AS NEEDED BASIS  

## 2012-01-29 ENCOUNTER — Other Ambulatory Visit: Payer: Self-pay

## 2012-01-29 ENCOUNTER — Encounter (HOSPITAL_BASED_OUTPATIENT_CLINIC_OR_DEPARTMENT_OTHER): Payer: Self-pay | Admitting: Emergency Medicine

## 2012-01-29 ENCOUNTER — Emergency Department (HOSPITAL_BASED_OUTPATIENT_CLINIC_OR_DEPARTMENT_OTHER)
Admission: EM | Admit: 2012-01-29 | Discharge: 2012-01-29 | Disposition: A | Discharge status: Home / Self Care | Payer: Medicare Other | Attending: Emergency Medicine | Admitting: Emergency Medicine

## 2012-01-29 DIAGNOSIS — E785 Hyperlipidemia, unspecified: Secondary | ICD-10-CM | POA: Insufficient documentation

## 2012-01-29 DIAGNOSIS — Q909 Down syndrome, unspecified: Secondary | ICD-10-CM | POA: Insufficient documentation

## 2012-01-29 DIAGNOSIS — R55 Syncope and collapse: Secondary | ICD-10-CM | POA: Insufficient documentation

## 2012-01-29 DIAGNOSIS — R197 Diarrhea, unspecified: Secondary | ICD-10-CM | POA: Insufficient documentation

## 2012-01-29 DIAGNOSIS — E039 Hypothyroidism, unspecified: Secondary | ICD-10-CM | POA: Insufficient documentation

## 2012-01-29 LAB — COMPREHENSIVE METABOLIC PANEL
ALT: 16 U/L (ref 0–53)
Alkaline Phosphatase: 91 U/L (ref 39–117)
CO2: 22 mEq/L (ref 19–32)
GFR calc Af Amer: >90 mL/min (ref >90)
GFR calc non Af Amer: 82 mL/min — ABNORMAL LOW (ref >90)
Glucose, Bld: 114 mg/dL — ABNORMAL HIGH (ref 70–99)
Potassium: 4.1 mEq/L (ref 3.5–5.1)
Sodium: 136 mEq/L (ref 135–145)

## 2012-01-29 LAB — DIFFERENTIAL
Lymphocytes Relative: 7 % — ABNORMAL LOW (ref 12–46)
Lymphs Abs: 0.4 10*3/uL — ABNORMAL LOW (ref 0.7–4.0)
Neutrophils Relative %: 78 % — ABNORMAL HIGH (ref 43–77)

## 2012-01-29 LAB — CBC
Platelets: 203 10*3/uL (ref 150–400)
RBC: 4.43 MIL/uL (ref 4.22–5.81)
WBC: 6.4 10*3/uL (ref 4.0–10.5)

## 2012-01-29 NOTE — ED Notes (Addendum)
Pt to ED via EMS from work- pt was found to be sleeping at work, which is unusual per staff; pt's c/o diarrhea today; possible syncopal episode (pt poor historian); pt incontinent of large amt of loose stool on arrival to ED

## 2012-01-29 NOTE — ED Provider Notes (Signed)
History     CSN: [id number]  Arrival date & time 01/29/12  1045   First MD Initiated Contact with Patient 01/29/12 1135      Chief Complaint  Patient presents with  . Diarrhea  . Near Syncope    (Consider location/radiation/quality/duration/timing/severity/associated sxs/prior treatment) HPI Comments: Patient with history of Down's.  Brought by caregivers after an episode that occurred this morning.  Patient was found lying on the floor in a fetal position snoring.  He was difficult to arouse initially but now is back to baseline.  He denies any pain or any complaints.  The caregivers say that he didn't get much sleep last night.  The history is provided by the patient.    Past Medical History  Diagnosis Date  . Down's syndrome   . Hyperlipidemia   . Hypothyroidism   . Macrocytosis 2007    MCV 104    Past Surgical History  Procedure Date  . Cataract extraction, bilateral   . Tonsillectomy   . Colonoscopy 2007    internal hemorrhoids    Family History  Problem Relation Age of Onset  . Stroke Father   . Lung cancer Sister   . Heart attack Neg Hx     History  Substance Use Topics  . Smoking status: Never Smoker   . Smokeless tobacco: Not on file  . Alcohol Use: No      Review of Systems  All other systems reviewed and are negative.    Allergies  Review of patient's allergies indicates no known allergies.  Home Medications   Current Outpatient Rx  Name Route Sig Dispense Refill  . ASPIRIN 81 MG PO TABS Oral Take 81 mg by mouth daily.      Marland Kitchen LEVOTHYROXINE SODIUM 50 MCG PO TABS Oral Take 50 mcg by mouth daily.      Marland Kitchen PROMETHAZINE HCL 25 MG PO TABS Oral Take 25 mg by mouth every 6 (six) hours as needed.      Marland Kitchen SIMVASTATIN 20 MG PO TABS Oral Take 20 mg by mouth at bedtime.        BP 141/89  Pulse 65  Temp(Src) 98.4 F (36.9 C) (Oral)  Resp 16  SpO2 96%  Physical Exam  Nursing note and vitals reviewed. Constitutional: He is oriented to person,  place, and time. He appears well-developed and well-nourished. No distress.  HENT:  Head: Normocephalic and atraumatic.  Right Ear: External ear normal.  Left Ear: External ear normal.  Mouth/Throat: Oropharynx is clear and moist.  Eyes: EOM are normal. Pupils are equal, round, and reactive to light.  Neck: Normal range of motion. Neck supple.  Cardiovascular: Normal rate and regular rhythm.   No murmur heard. Pulmonary/Chest: Effort normal and breath sounds normal. No respiratory distress. He has no wheezes.  Abdominal: Soft. Bowel sounds are normal. He exhibits no distension. There is no tenderness.  Musculoskeletal: Normal range of motion. He exhibits no edema.  Lymphadenopathy:    He has no cervical adenopathy.  Neurological: He is alert and oriented to person, place, and time. No cranial nerve deficit. Coordination normal.  Skin: Skin is warm and dry. He is not diaphoretic.    ED Course  Procedures (including critical care time)   Labs Reviewed  CBC  DIFFERENTIAL  COMPREHENSIVE METABOLIC PANEL   No results found.   No diagnosis found.   Date: 01/29/2012  Rate: 65  Rhythm: normal sinus rhythm  QRS Axis: normal  Intervals: normal  ST/T Wave abnormalities:  normal  Conduction Disutrbances:none  Narrative Interpretation:   Old EKG Reviewed: none available    MDM  The patient seems fine.  I am uncertain as to what caused his symptoms, but it doesn't appear to be anything emergent.  Will discharge, follow up prn.        Geoffery Lyons, MD 01/29/12 1320

## 2012-01-29 NOTE — Discharge Instructions (Signed)
Syncope You have had a fainting (syncopal) spell. A fainting episode is a sudden, short-lived loss of consciousness. It results in complete recovery. It occurs because there has been a temporary shortage of oxygen and/or sugar (glucose) to the brain. CAUSES   Blood pressure pills and other medications that may lower blood pressure below normal. Sudden changes in posture (sudden standing).   Over-medication. Take your medications as directed.   Standing too long. This can cause blood to pool in the legs.   Seizure disorders.   Low blood sugar (hypoglycemia) of diabetes. This more commonly causes coma.   Bearing down to go to the bathroom. This can cause your blood pressure to rise suddenly. Your body compensates by making the blood pressure too low when you stop bearing down.   Hardening of the arteries where the brain temporarily does not receive enough blood.   Irregular heart beat and circulatory problems.   Fear, emotional distress, injury, sight of blood, or illness.  Your caregiver will send you home if the syncope was from non-worrisome causes (benign). Depending on your age and health, you may stay to be monitored and observed. If you return home, have someone stay with you if your caregiver feels that is desirable. It is very important to keep all follow-up referrals and appointments in order to properly manage this condition. This is a serious problem which can lead to serious illness and death if not carefully managed.  WARNING: Do not drive or operate machinery until your caregiver feels that it is safe for you to do so. SEEK IMMEDIATE MEDICAL CARE IF:   You have another fainting episode or faint while lying or sitting down. DO NOT DRIVE YOURSELF. Call 911 if no other help is available.   You have chest pain, are feeling sick to your stomach (nausea), vomiting or abdominal pain.   You have an irregular heartbeat or one that is very fast (pulse over 120 beats per minute).    You have a loss of feeling in some part of your body or lose movement in your arms or legs.   You have difficulty with speech, confusion, severe weakness, or visual problems.   You become sweaty and/or feel light headed.  Make sure you are rechecked as instructed. Document Released: 11/19/2005 Document Revised: 08/01/2011 Document Reviewed: 07/10/2007 Exit[employer] Patient Information 2012 [employer]. 

## 2012-01-30 ENCOUNTER — Ambulatory Visit (INDEPENDENT_AMBULATORY_CARE_PROVIDER_SITE_OTHER): Payer: Medicare Other | Admitting: Internal Medicine

## 2012-01-30 ENCOUNTER — Encounter: Payer: Self-pay | Admitting: Internal Medicine

## 2012-01-30 DIAGNOSIS — R55 Syncope and collapse: Secondary | ICD-10-CM

## 2012-01-30 DIAGNOSIS — Q909 Down syndrome, unspecified: Secondary | ICD-10-CM

## 2012-01-30 NOTE — Progress Notes (Signed)
Subjective:    Patient ID: Travis Sampson [facility], male    DOB: [birth year], 56 y.o.   MRN: [id number]  HPI He was seen in emergency room 01/30/12 following syncope of unknown etiology. Apparently he was found curled up asleep on the floor. The onset of event was not visualized. He was at his job; he cannot describe any trigger. He did have stool and urinary incontinence but there was no tongue trauma or musculoskeletal injury. There were no bowel or urinary symptoms prior to the event. ER data were  reviewed. There were no significant lab abnormalities except for glucose of 114 which was nonfasting. EKG revealed minor nonspecific ST-T changes in leads 3. No imaging in ER. He has seen Dr Hyacinth Meeker, Neurologist in 2012    Review of Systems There has been no recurrence of event. He denied associated headache, blurred vision, double vision, acute hearing loss, or tinnitus.  He is unsure as to whether there was a change in his heart rhythm and rate prior to the event. He denied chest pain or shortness of breath.     Objective:   Physical Exam Gen.: Down's stigmata/ habitus but well-nourished in appearance. Alert and cooperative throughout exam. Head: Flattened posteriorly  without obvious abnormalities Eyes: No corneal or conjunctival inflammation noted.  Extraocular motion intact but difficulty with completion. He has horizontal nystagmus laterally with Field of  Vision testing; FOV gr&ossly normal. Ears: External  ear exam reveals no significant lesions or deformities. Canals : wax bilaterally .TMs partially visulaized  normal. Hearing is grossly normal bilaterally. Nose: External nasal exam reveals no deformity or inflammation. Nasal mucosa are pink and moist. No lesions or exudates noted.   Mouth: Oral mucosa and oropharynx reveal no lesions or exudates. Teeth in good repair. Neck: No deformities, masses, or tenderness noted. Range of motion normal. Some webbing Lungs: Normal respiratory effort;  chest expands symmetrically. Lungs are clear to auscultation without rales, wheezes, or increased work of breathing. Heart: Normal rate and rhythm. Normal S1 and S2. No gallop, click, or rub. No murmur.                                                                             Musculoskeletal/extremities: Down's  Deformities of hands(shortened digits)  No clubbing, cyanosis, edema, or deformity noted. Range of motion  normal .Tone & strength  normal.Joints normal. Nail health  good. Vascular: Carotid & radial artery pulses are full and equal. No bruits present. Neurologic: Oriented to date ; not to [facility]). Deep tendon reflexes symmetrical and normal. Gait slightly broad. Romberg testing and finger-nose essentially normal. Difficulty following commands     Skin: Intact without suspicious lesions; peri oral dry skin Lymph: No cervical, axillary, or inguinal lymphadenopathy present. Psych: Mood and affect are slightly flat but he is interactive  Assessment & Plan:  #1 syncope  #2 possible early dementia  Plan: Neurology consultation

## 2012-01-30 NOTE — Patient Instructions (Signed)
.  Share results with  Dr Chriss Czar

## 2012-04-30 ENCOUNTER — Ambulatory Visit (INDEPENDENT_AMBULATORY_CARE_PROVIDER_SITE_OTHER): Payer: Self-pay | Admitting: Internal Medicine

## 2012-04-30 ENCOUNTER — Encounter: Payer: Self-pay | Admitting: Internal Medicine

## 2012-04-30 VITALS — BP 124/80 | HR 56 | Temp 98.4°F | Wt 156.0 lb

## 2012-04-30 DIAGNOSIS — H109 Unspecified conjunctivitis: Secondary | ICD-10-CM

## 2012-04-30 MED ORDER — ERYTHROMYCIN 5 MG/GM OP OINT: TOPICAL_OINTMENT | OPHTHALMIC | Status: DC

## 2012-04-30 NOTE — Progress Notes (Signed)
  Subjective:    Patient ID: Travis Sampson [facility], male    DOB: 27-Apr-1956, 56 y.o.   MRN: [id number]  HPI He has had redness and itching in the right eye for the last week to 10 days. Over the last 5 days he's had some mucoid discharge from the eye.  He's had no fever, cough, shortness of breath, myalgias, or pain.  He's had no definite sick contacts.  There's been no vision loss   The discharge has improved using moist compresses    Review of Systems  He has no  nasal congestion/obstruction; nasal purulence; facial pain; anosmia; fatigue; fever; headache; halitosis; earache and dental pain.     Objective:   Physical Exam he exhibits classic stigmata of Down syndrome with  neck webbing, short stature and digit changes.  Conjunctivitis, mild scleritis and ectropion are  present in the right eye. He has resting esotropia on the right. There is some white mucus medially. Vision is grossly intact with lenses. Extraocular motion is intact  There is drying around the right eye as well as periorally.  Dental hygiene is good with no caries.  Nares are dry without exudate.  There is increased cerumen bilaterally  Breath sounds are decreased; he has no increased work of breathing or abnormal breath sounds  Exhibits an S4 with no significant murmurs.  There is no lymphadenopathy about the neck or axilla        Assessment & Plan:  #1 conjunctivitis and ectropion.  Plan: See orders. If the symptoms persist; ophthalmology referral will be indicated

## 2012-04-30 NOTE — Patient Instructions (Signed)
Use the ophthalmologic ointment as directed. Strict hand washing before and after its application. 

## 2012-06-18 DIAGNOSIS — R262 Difficulty in walking, not elsewhere classified: Secondary | ICD-10-CM | POA: Diagnosis not present

## 2012-06-18 DIAGNOSIS — R413 Other amnesia: Secondary | ICD-10-CM | POA: Diagnosis not present

## 2012-06-18 DIAGNOSIS — R269 Unspecified abnormalities of gait and mobility: Secondary | ICD-10-CM | POA: Diagnosis not present

## 2012-06-18 DIAGNOSIS — R55 Syncope and collapse: Secondary | ICD-10-CM | POA: Diagnosis not present

## 2012-06-18 DIAGNOSIS — G609 Hereditary and idiopathic neuropathy, unspecified: Secondary | ICD-10-CM | POA: Diagnosis not present

## 2012-06-18 DIAGNOSIS — Q909 Down syndrome, unspecified: Secondary | ICD-10-CM | POA: Diagnosis not present

## 2012-06-27 DIAGNOSIS — R262 Difficulty in walking, not elsewhere classified: Secondary | ICD-10-CM | POA: Diagnosis not present

## 2012-06-27 DIAGNOSIS — G609 Hereditary and idiopathic neuropathy, unspecified: Secondary | ICD-10-CM | POA: Diagnosis not present

## 2012-06-27 DIAGNOSIS — R413 Other amnesia: Secondary | ICD-10-CM | POA: Diagnosis not present

## 2012-06-27 DIAGNOSIS — Q909 Down syndrome, unspecified: Secondary | ICD-10-CM | POA: Diagnosis not present

## 2012-07-08 DIAGNOSIS — R413 Other amnesia: Secondary | ICD-10-CM | POA: Diagnosis not present

## 2012-07-08 DIAGNOSIS — R269 Unspecified abnormalities of gait and mobility: Secondary | ICD-10-CM | POA: Diagnosis not present

## 2012-07-08 DIAGNOSIS — G609 Hereditary and idiopathic neuropathy, unspecified: Secondary | ICD-10-CM | POA: Diagnosis not present

## 2012-07-08 DIAGNOSIS — Q909 Down syndrome, unspecified: Secondary | ICD-10-CM | POA: Diagnosis not present

## 2012-07-24 DIAGNOSIS — H53009 Unspecified amblyopia, unspecified eye: Secondary | ICD-10-CM | POA: Diagnosis not present

## 2012-09-11 DIAGNOSIS — Q909 Down syndrome, unspecified: Secondary | ICD-10-CM | POA: Diagnosis not present

## 2012-09-11 DIAGNOSIS — G609 Hereditary and idiopathic neuropathy, unspecified: Secondary | ICD-10-CM | POA: Diagnosis not present

## 2012-09-11 DIAGNOSIS — R262 Difficulty in walking, not elsewhere classified: Secondary | ICD-10-CM | POA: Diagnosis not present

## 2012-09-11 DIAGNOSIS — R413 Other amnesia: Secondary | ICD-10-CM | POA: Diagnosis not present

## 2012-09-29 ENCOUNTER — Encounter: Payer: Self-pay | Admitting: Internal Medicine

## 2012-09-29 ENCOUNTER — Ambulatory Visit (INDEPENDENT_AMBULATORY_CARE_PROVIDER_SITE_OTHER): Payer: Medicare Other | Admitting: Internal Medicine

## 2012-09-29 VITALS — BP 124/80 | HR 70 | Wt 166.0 lb

## 2012-09-29 DIAGNOSIS — E039 Hypothyroidism, unspecified: Secondary | ICD-10-CM | POA: Diagnosis not present

## 2012-09-29 DIAGNOSIS — R413 Other amnesia: Secondary | ICD-10-CM | POA: Insufficient documentation

## 2012-09-29 DIAGNOSIS — R7309 Other abnormal glucose: Secondary | ICD-10-CM

## 2012-09-29 DIAGNOSIS — E785 Hyperlipidemia, unspecified: Secondary | ICD-10-CM | POA: Diagnosis not present

## 2012-09-29 DIAGNOSIS — Z23 Encounter for immunization: Secondary | ICD-10-CM | POA: Diagnosis not present

## 2012-09-29 DIAGNOSIS — R9431 Abnormal electrocardiogram [ECG] [EKG]: Secondary | ICD-10-CM | POA: Diagnosis not present

## 2012-09-29 LAB — LIPID PANEL
LDL Cholesterol: 74 mg/dL (ref 0–99)
Total CHOL/HDL Ratio: 3
Triglycerides: 77 mg/dL (ref 0.0–149.0)
VLDL: 15.4 mg/dL (ref 0.0–40.0)

## 2012-09-29 LAB — ALT: ALT: 31 U/L (ref 0–53)

## 2012-09-29 LAB — HEMOGLOBIN A1C: Hgb A1c MFr Bld: 6 % (ref 4.6–6.5)

## 2012-09-29 NOTE — Assessment & Plan Note (Signed)
Diabetic risk will be assessed with an A1c 

## 2012-09-29 NOTE — Assessment & Plan Note (Signed)
Lipids will be checked; he is not fasting. Non-HDL cholesterol will be assessed

## 2012-09-29 NOTE — Assessment & Plan Note (Signed)
Take the EKG to any emergency room or preop visits. There are nonspecific changes; as long as there is no new change these are not clinically significant

## 2012-09-29 NOTE — Patient Instructions (Addendum)
If you activate My Chart; the results can be released directly as soon as they populate from the lab. If  this program is not employed; the labs have to be reviewed, copied & mailed  causing a delay in receiving the results.

## 2012-09-29 NOTE — Progress Notes (Signed)
  Subjective:    Patient ID: Travis Sampson [facility], male    DOB: 06-Jul-1956, 56 y.o.   MRN: [id number]  HPI He has a Medicare physical scheduled in January 2 014; his last TSH was 1.35 and A1c 6% in October of last year. Glucoses are not checked; he denies excessive thirst, hunger, or urination. He has no nonhealing skin lesions. He denies numbness or tingling in the feet. He has no blurred vision, double vision, or loss of vision. Weight has been stable; he has no bowel changes.    Review of Systems He is seeing Dr. Hyacinth Meeker, neurologist for memory deficit; low-dose generic Aricept has been prescribed. Those notes were reviewed.  EKGs revealed nonspecific ST-T changes in leads 3 and aVF. He denies chest pain, palpitations, dyspnea, edema, or claudication     Objective:   Physical Exam Gen.:  well-nourished in appearance. Appropriate and cooperative throughout exam.Classic Down's facies & body habitus.  Eyes: No corneal or conjunctival inflammation noted. No lid lag or proptosis. Extraocular motion intact.  Neck: No deformities, masses, or tenderness noted. Webbing not significant. Thyroid normal. Lungs: Normal respiratory effort; chest expands symmetrically. Lungs are clear to auscultation without rales, wheezes, or increased work of breathing. Heart: Normal rate and rhythm. Normal S1 and S2. No gallop, click, or rub. S4 w/o murmur. Abdomen: Bowel sounds normal; abdomen soft and nontender. No masses, organomegaly or hernias noted.                                                                                 Musculoskeletal/extremities: Lordosis  noted of  the thoracic spine. No clubbing, cyanosis, edema, or deformity noted. Range of motion  normal .Tone & strength  normal.Joints normal. Nail health  good. Vascular: Carotid, radial artery, dorsalis pedis and  posterior tibial pulses are full and equal. No bruits present. Neurologic:  Deep tendon reflexes symmetrical and normal. Light touch  normal over feet.           Skin: Intact without suspicious lesions or rashes.Plethora of feet; L > R Lymph: No cervical, axillary lymphadenopathy present. Psych: Mood and affect are flat but he is interactive                                                                                         Assessment & Plan:  Note: Meds are refilled directly from the physician order sheet from the facility. The pharmacy does not except the prescriptions. Medicines renewed for 6 months.

## 2012-09-29 NOTE — Assessment & Plan Note (Signed)
TSH will be drawn

## 2012-12-29 ENCOUNTER — Encounter: Payer: Self-pay | Admitting: Internal Medicine

## 2012-12-29 ENCOUNTER — Ambulatory Visit (INDEPENDENT_AMBULATORY_CARE_PROVIDER_SITE_OTHER): Payer: Medicare Other | Admitting: Internal Medicine

## 2012-12-29 VITALS — BP 118/80 | HR 56 | Temp 97.6°F | Resp 14 | Ht 61.08 in | Wt 168.0 lb

## 2012-12-29 DIAGNOSIS — Z0000 Encounter for general adult medical examination without abnormal findings: Secondary | ICD-10-CM

## 2012-12-29 DIAGNOSIS — E785 Hyperlipidemia, unspecified: Secondary | ICD-10-CM

## 2012-12-29 DIAGNOSIS — I10 Essential (primary) hypertension: Secondary | ICD-10-CM

## 2012-12-29 LAB — BASIC METABOLIC PANEL
Calcium: 9 mg/dL (ref 8.4–10.5)
Creatinine, Ser: 0.9 mg/dL (ref 0.4–1.5)
GFR: 92.1 mL/min (ref >60.00)
Glucose, Bld: 92 mg/dL (ref 70–99)
Sodium: 139 mEq/L (ref 135–145)

## 2012-12-29 NOTE — Patient Instructions (Addendum)
Share results with all non [facility] medical staff seen. Use Eucerin   twice a day  for the dry skin. Bathe with moisturizing liquid soap , not bar soap.  Please review the medication list in the After Visit Summary provided.Please verify the medication name (this may be  brand or generic) & correct dosage. Write the name of the prescribing physician to the right of the medication and share this with all medical staff seen at each appointment. This will help provide continuity of care; help optimize therapeutic interventions;and help prevent drug:drug adverse reaction.

## 2012-12-29 NOTE — Progress Notes (Signed)
Subjective:    Patient ID: Travis Sampson, male    DOB: 1956-05-02, 57 y.o.   MRN: [id number]  HPI Medicare Wellness Visit:  Psychosocial & medical history were reviewed as required by Medicare (abuse,antisocial behavioral risks,firearm risk).  Social history: caffeine: some sodas occasionally  , alcohol: no  ,  tobacco use:  no  Exercise : no No home & personal  safety / fall risk Activities of daily living: no limitations  Seatbelt  and smoke alarm employed. Power of Attorney/Living Will status : needed Ophthalmology exam current @ Dr Digby's Hearing evaluation not current Orientation :looks @ cell phone for date; unable to name President Memory & recall :poor; 0 of 3 Spelling or math testing:poor ("WOLD" for world) Mood & affect : normal . Depression / anxiety: denied Travel history : never  Immunization status :current / Shingles /Flu/ PNA/ tetanus needed Transfusion history:  none  Preventive health surveillance ( colonoscopy as per [facility]): current Dental care:  Every 6 mos. Chart reviewed &  Updated. Active issues reviewed & addressed.       Review of Systems HYPERTENSION: Disease Monitoring:  Blood pressure not monitored Compliant with present antihypertensive regimen   PMH of FASTING HYPERGLYCEMIA : A1c 6% in 10/13 Disease Monitoring: Blood Sugar: no monitor  Medication Compliance: no diabetic meds Diet:no plan Exercise : none   Ophth exam current Podiatry exam not current    HYPERLIPIDEMIA: LDL 74 , HDL 49.2 in 10/13 Diet & exercise as noted Medication Compliance:  Statin taken  FH premature CAD / CVA :  no (updated & recorded in  FH)    Past medical history/family history/social history were all reviewed and updated.     He denies significant headache,Chest pain, palpitations , claudications, PNDyspnea,  Exertional dyspnea, Lightheadedness,Syncope,Edema,Polyuria/phagia/dipsia , Numbness & tingling ,Non healing skin lesions,Blurred , double or loss  of vision, Abd pain, bowel changes,Myalgias              Objective:   Physical Exam Gen.: Down's habitus & facies; well-nourished in appearance. Alert, appropriate and cooperative throughout exam.   Eyes: No corneal or conjunctival inflammation noted. Ptosis bilaterally. Extraocular motion intact. . Ears: External  ear exam reveals no significant lesions or deformities. R TM normal. Wax on L. Hearing is grossly normal bilaterally. Nose: External nasal exam reveals no deformity or inflammation. Nasal mucosa are pink and moist. No lesions or exudates noted.   Mouth: Oral mucosa and oropharynx reveal no lesions or exudates. Teeth in good repair.Marked drying of lips Neck: No deformities, masses, or tenderness noted. Range of motion decreased. Thyroid substernal. Lungs: Normal respiratory effort; chest expands symmetrically. Lungs are clear to auscultation without rales, wheezes, or increased work of breathing. Heart: Normal rate and rhythm. Normal S1 and S2. No gallop, click, or rub. S4 w/o murmur. Abdomen: Bowel sounds normal; abdomen soft and nontender. No masses, organomegaly or hernias noted. Genitalia/DRE : deferred                                    Musculoskeletal/extremities: There is some asymmetry of the posterior thoracic musculature suggesting occult scoliosis.  No clubbing, cyanosis, edema, or significant extremity  deformity noted. Range of motion normal .Tone & strength  normal.Joints normal . Nails long but in good health . Able to lie down & sit up w/o help. Negative SLR bilaterally Vascular: Carotid, radial artery, dorsalis pedis and  posterior tibial pulses  are full and equal. No bruits present. Neurologic:  Deep tendon reflexes symmetrical and normal.  Skin: Intact without suspicious lesions or rashes.Facial drying as noted Lymph: No cervical, axillary lymphadenopathy present. Psych: Mood and affect are normal. Normally interactive                                                                                          Assessment & Plan:  #1 Medicare Wellness Exam; criteria met ; data entered #2 Problem List reviewed ; Assessment/ Recommendations made Plan: see Orders

## 2013-01-19 ENCOUNTER — Telehealth: Payer: Self-pay | Admitting: Internal Medicine

## 2013-01-19 NOTE — Telephone Encounter (Signed)
Pt is scheduled with Dr. Alwyn Ren on Tues. 01-20-13 @ 1:00pm.//AB/CMA

## 2013-01-19 NOTE — Telephone Encounter (Signed)
Patient Information:  Caller Name: Kandee Keen  Phone: [phone]  Patient: Travis Sampson, Travis Sampson  Gender: Male  DOB: [birth year]  Age: 57 Years  PCP: Marga Melnick  Office Follow Up:  Does the office need to follow up with this patient?: Yes  Instructions For The Office: OFFICE PLEASE SURE APPT TIME IS OK WITH MD; NO APPT FOUND FOR TODAY.  CALLER WENT BACK TO LOOK AT EYE AND STATES IT IS SWOLLEN BUT NOT SHUT.  PT CAN STILL SEE OUT OF EYE.  STANDING ORDERS WERE NOT ABLE TO BE USED BECAUSE OF THE SWELLING REPORTED  RN Note:  caller reports it is swollen but he can still see out of it.  Standing orders not used because of the swelling reported.  RN scheduled the first available appt for Dr Alwyn Ren  Symptoms  Reason For Call & Symptoms: pink eye symptoms (swollen , yellow drainage)  Reviewed Health History In EMR: Yes  Reviewed Medications In EMR: Yes  Reviewed Allergies In EMR: Yes  Reviewed Surgeries / Procedures: Yes  Date of Onset of Symptoms: 01/17/2013  Guideline(s) Used:  Eye - Pus or Discharge  Disposition Per Guideline:   Go to Office Now  Reason For Disposition Reached:   Eyelids are very swollen (shut or almost)  Advice Given:  Eyelid Cleansing:   Gently wash eyelids and lashes with warm water and wet cotton balls (or cotton gauze). Remove all the dried and liquid pus.  Do this as often as needed.  Contagiousness:  Pinkeye is contagious. It is very easy to spread to other people. You can spread it by shaking hands.  Call Back If  You become worse.  Patient Refused Recommendation:  Patient Will Follow Up With Office Later  No appt found for today, caller states pt only sees Dr Alwyn Ren.

## 2013-01-20 ENCOUNTER — Encounter: Payer: Self-pay | Admitting: Internal Medicine

## 2013-01-20 ENCOUNTER — Ambulatory Visit (INDEPENDENT_AMBULATORY_CARE_PROVIDER_SITE_OTHER): Payer: Medicare Other | Admitting: Internal Medicine

## 2013-01-20 VITALS — BP 122/78 | HR 56 | Temp 97.6°F | Wt 163.0 lb

## 2013-01-20 DIAGNOSIS — H1089 Other conjunctivitis: Secondary | ICD-10-CM | POA: Diagnosis not present

## 2013-01-20 DIAGNOSIS — A499 Bacterial infection, unspecified: Secondary | ICD-10-CM | POA: Diagnosis not present

## 2013-01-20 DIAGNOSIS — H109 Unspecified conjunctivitis: Secondary | ICD-10-CM

## 2013-01-20 MED ORDER — ERYTHROMYCIN 5 MG/GM OP OINT: TOPICAL_OINTMENT | OPHTHALMIC | Status: DC

## 2013-01-20 NOTE — Patient Instructions (Addendum)
After washing hands apply approximately 1 inch of the antibiotic ointment in the lower eye sac every 6 hours while awake. Wash hands again after applying the medication. 

## 2013-01-20 NOTE — Progress Notes (Signed)
  Subjective:    Patient ID: Travis Sampson, male    DOB: 30-Jul-1956, 57 y.o.   MRN: [id number]  HPI Symptoms began over the weekend of 2/14 with associated redness and possible yellow discharge from the right eye. He denies associated pain or vision loss. There's been no treatment to date.  No other resonance of the facility have had similar problem.     Review of Systems He is unsure whether his had frontal headache, facial pain, nasal purulence, or sore throat.  He also denies associated cough or sputum production     Objective:   Physical Exam  General appearance:adequately nourished; no acute distress or increased work of breathing is present.  No  lymphadenopathy about the head, neck, or axilla noted. Down's physiogamy  Eyes: Marked erythema of the right conjunctiva with some scleritis. Crusted dried material medially. Extraocular motion intact. Vision grossly intact.  Ears:  External ear exam shows no significant lesions or deformities.  Otoscopic examination reveals wax greater on the left than the right.  Nose:  External nasal examination shows no deformity or inflammation. Nasal mucosa are dry without lesions or exudates. No septal dislocation or deviation.No obstruction to airflow.   Oral exam: Dental hygiene is good; lips and gums are healthy appearing.There is no oropharyngeal erythema or exudate noted.   Neck:  No deformities, thyromegaly, masses, or tenderness noted.   Supple with full range of motion without pain.   Heart:  Slow rate and regular rhythm. S1 and S2 normal without gallop, murmur, click, rub or other extra sounds.   Lungs:Chest clear to auscultation; no wheezes, rhonchi,rales ,or rubs present.No increased work of breathing.    Extremities:  No cyanosis, edema, or clubbing  noted    Skin: Warm & dry         Assessment & Plan:  #1 bacterial conjunctivitis  Plan: See orders

## 2013-01-28 DIAGNOSIS — H10019 Acute follicular conjunctivitis, unspecified eye: Secondary | ICD-10-CM | POA: Diagnosis not present

## 2013-02-02 DIAGNOSIS — H10019 Acute follicular conjunctivitis, unspecified eye: Secondary | ICD-10-CM | POA: Diagnosis not present

## 2013-02-12 DIAGNOSIS — R413 Other amnesia: Secondary | ICD-10-CM | POA: Diagnosis not present

## 2013-04-15 DIAGNOSIS — R413 Other amnesia: Secondary | ICD-10-CM | POA: Diagnosis not present

## 2013-04-15 DIAGNOSIS — G609 Hereditary and idiopathic neuropathy, unspecified: Secondary | ICD-10-CM | POA: Diagnosis not present

## 2013-04-15 DIAGNOSIS — Q909 Down syndrome, unspecified: Secondary | ICD-10-CM | POA: Diagnosis not present

## 2013-04-15 DIAGNOSIS — R262 Difficulty in walking, not elsewhere classified: Secondary | ICD-10-CM | POA: Diagnosis not present

## 2013-04-22 ENCOUNTER — Telehealth: Payer: Self-pay | Admitting: Internal Medicine

## 2013-04-22 NOTE — Telephone Encounter (Signed)
Left message to call office

## 2013-04-22 NOTE — Telephone Encounter (Signed)
Patient Information:  Caller Name: Claris Gower  Phone: [phone]  Patient: Travis Sampson, Travis Sampson  Gender: Male  DOB: 12-Sep-1956  Age: 57 Years  PCP: Marga Melnick  Office Follow Up:  Does the office need to follow up with this patient?: No  Instructions For The Office: N/A  RN Note:  triaged advised caller that all appts are full for today.  Transferred her to appt line to have appt scheduled within the next 3 days, per triage recommendation.    Symptoms  Reason For Call & Symptoms: Caregiver calling today 04/22/13 regarding pt fell at work on Sunday 04/20/13.  When the supervisor found him they found him with soiled clothes.  Pt has been having issues since then of soiling his clothing with BM since his fall.  Pt said he did not pass out, he just hit his head when he fell.  No headaches or back pain since fall.  Just soiling himself with diarrhea.  Reviewed Health History In EMR: Yes  Reviewed Medications In EMR: Yes  Reviewed Allergies In EMR: Yes  Reviewed Surgeries / Procedures: Yes  Date of Onset of Symptoms: 04/19/2013  Guideline(s) Used:  Diarrhea  Disposition Per Guideline:   See Within 3 Days in Office  Reason For Disposition Reached:   Patient wants to be seen  Advice Given:  Fluids:  Drink more fluids, at least 8-10 glasses (8 oz or 240 ml) daily.  Nutrition:  Ideal initial foods include boiled starches/cereals (e.g., potatoes, rice, noodles, wheat, oats) with a small amount of salt to taste.  Diarrhea Medication  - Imodium AD:   Adult dosage: 4 mg (2 capsules or 4 teaspoons or 20 ml) is the recommended first dose. You may take an additional 2 mg (1 capsule or 2 teaspoons or 10 ml) after each loose BM.  Call Back If:  You become worse.  Patient Will Follow Care Advice:  YES

## 2013-04-22 NOTE — Telephone Encounter (Signed)
He is being seen by a neurologist in [facility]; he should be seen by that group to rule out seizures in view of the history.

## 2013-04-22 NOTE — Telephone Encounter (Signed)
Pt schedule for OV 04-24-13 on Friday at 4 pm.Please advise if you would like for Pt to be seen sooner.

## 2013-04-23 NOTE — Telephone Encounter (Signed)
Spoke to Lowell & she states that the stool incontinence did not start after he saw his neurologist. Today the pt has not complained of any watery stools. Claris Gower stated they would still like to keep the appt with Dr. Alwyn Ren on 5.23.

## 2013-04-23 NOTE — Telephone Encounter (Signed)
Travis Sampson states that he saw his neurologist on last week and there was no med changes. She also states that they found out that several co-worker at his job was experience the same symptoms so she is not sure if he still needs to be seen or if he should just let it run its course. Pt did not have any diarrhea as of last night. Please advise if OV still needed.

## 2013-04-23 NOTE — Telephone Encounter (Signed)
If the stool incontinence started after he hit his head 5/19 this should be assessed by the neurologist unless they saw him since the head injury. If this is simply loose to watery stool; he can keep  5/23 appt

## 2013-04-24 ENCOUNTER — Encounter: Payer: Self-pay | Admitting: Internal Medicine

## 2013-04-24 ENCOUNTER — Ambulatory Visit (INDEPENDENT_AMBULATORY_CARE_PROVIDER_SITE_OTHER): Payer: Medicare Other | Admitting: Internal Medicine

## 2013-04-24 VITALS — BP 126/78 | HR 71 | Temp 97.8°F | Wt 152.0 lb

## 2013-04-24 DIAGNOSIS — R197 Diarrhea, unspecified: Secondary | ICD-10-CM

## 2013-04-24 NOTE — Patient Instructions (Signed)
Report  Warning Signs as discussed. 

## 2013-04-24 NOTE — Progress Notes (Signed)
  Subjective:    Patient ID: Travis Sampson, male    DOB: [birth year], 57 y.o.   MRN: [id number]  HPI  Diarrhea began 04/19/13 while at work. Apparently several other employees also had gastroenteritis-type symptoms.  He is unable to quantitate the number watery bowel movements he has had; but symptoms resolved as of the morning of 5/22. He's had Imodium on one occasion.  Focus was on maintaining good hydration.  Apparently he hit his head as he was trying to mobilize in the bathroom 5/18. There was no definite loss of consciousness and no seizure activity dense.    Review of Systems  He denies fever, chills, sweats, unexplained weight loss. He also has had no abdominal pain, melena, or rectal bleeding. See O2 sats ;no dyspnea.Repeat 99%     Objective:   Physical Exam General appearance:good  nourishment w/o distress. Down's stigmata  Eyes: No conjunctival inflammation or scleral icterus is present.  Oral exam: There is no oropharyngeal erythema or exudate noted. Tongue moist  Heart:  Normal rate and regular rhythm. S1 and S2 normal without gallop,  click, rub or other extra sounds  . Grade 1/2 over 6 systolic murmur   Lungs:Chest clear to auscultation; no wheezes, rhonchi,rales ,or rubs present.No increased work of breathing.   Abdomen: bowel sounds normal, soft and non-tender without masses, organomegaly or hernias noted.  No guarding or rebound   Skin:Warm & dry.  Intact without suspicious lesions or rashes ; no jaundice or tenting  Lymphatic: No lymphadenopathy is noted about the head, neck, axilla             Assessment & Plan:  #1 diarrheal illness, probable viral gastroenteritis among coworkers. Clinically resolved. No sign of dehydration  Plan: No intervention indicated

## 2013-06-29 ENCOUNTER — Ambulatory Visit (INDEPENDENT_AMBULATORY_CARE_PROVIDER_SITE_OTHER): Payer: Medicare Other | Admitting: Internal Medicine

## 2013-06-29 ENCOUNTER — Encounter: Payer: Self-pay | Admitting: Internal Medicine

## 2013-06-29 VITALS — BP 124/78 | HR 75 | Temp 97.8°F | Wt 153.0 lb

## 2013-06-29 DIAGNOSIS — R7309 Other abnormal glucose: Secondary | ICD-10-CM | POA: Diagnosis not present

## 2013-06-29 DIAGNOSIS — Q909 Down syndrome, unspecified: Secondary | ICD-10-CM | POA: Diagnosis not present

## 2013-06-29 DIAGNOSIS — E785 Hyperlipidemia, unspecified: Secondary | ICD-10-CM

## 2013-06-29 DIAGNOSIS — E039 Hypothyroidism, unspecified: Secondary | ICD-10-CM | POA: Diagnosis not present

## 2013-06-29 LAB — ALT: ALT: 28 U/L (ref 0–53)

## 2013-06-29 LAB — AST: AST: 26 U/L (ref 0–37)

## 2013-06-29 LAB — LIPID PANEL
Cholesterol: 179 mg/dL (ref 0–200)
LDL Cholesterol: 115 mg/dL — ABNORMAL HIGH (ref 0–99)
Total CHOL/HDL Ratio: 4

## 2013-06-29 NOTE — Patient Instructions (Addendum)
Share results with all non [facility] medical staff seen  

## 2013-06-29 NOTE — Assessment & Plan Note (Signed)
TSH 

## 2013-06-29 NOTE — Assessment & Plan Note (Signed)
Lipids, AST,ALT

## 2013-06-29 NOTE — Assessment & Plan Note (Signed)
A1c

## 2013-06-29 NOTE — Progress Notes (Signed)
  Subjective:    Patient ID: Travis Sampson, male    DOB: 1956-02-24, 57 y.o.   MRN: [id number]  HPI He is asymptomatic @ present. He is described as less interactive except for routine activities.    Review of Systems He is on a modified heart healthy diet; he is not exercising .He denies chest pain, palpitations, dyspnea, or claudication. Family history is negative for premature coronary disease. Compliant with statin     Objective:   Physical Exam Classic Down's stigmata; appears adequately nourished & in no acute distress  No carotid bruits are present.No neck pain distention present at 10 - 15 degrees. Thyroid normal to palpation  Heart rhythm and rate are normal with no significant murmurs or gallops.  Chest is clear with no increased work of breathing  There is no evidence of aortic aneurysm or renal artery bruits  Abdomen soft with no organomegaly or masses. No HJR  No clubbing or cyanosis.TRace-1/2 + edema present @ ankle.  Pedal pulses are intact  . No ischemic skin changes are present . Livedo pattern over legs. Short digits; fingernails healthy without  fungal changes    Strength, tone, DTRs reflexes normal          Assessment & Plan:  See Current Assessment & Plan in Problem List under specific Diagnosis

## 2013-07-03 ENCOUNTER — Other Ambulatory Visit: Payer: Self-pay | Admitting: Internal Medicine

## 2013-07-03 NOTE — Telephone Encounter (Signed)
ASA &  Simvastatin OK X 3 mos with 1 refill. Aricept from Neurologist in [facility]

## 2013-07-03 NOTE — Telephone Encounter (Signed)
Hopp please advise, I do not see fill history on these medications

## 2013-07-04 DIAGNOSIS — Z79899 Other long term (current) drug therapy: Secondary | ICD-10-CM | POA: Diagnosis not present

## 2013-07-04 DIAGNOSIS — Z7982 Long term (current) use of aspirin: Secondary | ICD-10-CM | POA: Diagnosis not present

## 2013-07-04 DIAGNOSIS — R918 Other nonspecific abnormal finding of lung field: Secondary | ICD-10-CM | POA: Diagnosis not present

## 2013-07-04 DIAGNOSIS — F0280 Dementia in other diseases classified elsewhere without behavioral disturbance: Secondary | ICD-10-CM | POA: Diagnosis not present

## 2013-07-04 DIAGNOSIS — E785 Hyperlipidemia, unspecified: Secondary | ICD-10-CM | POA: Diagnosis not present

## 2013-07-04 DIAGNOSIS — Q909 Down syndrome, unspecified: Secondary | ICD-10-CM | POA: Diagnosis not present

## 2013-07-04 DIAGNOSIS — M79609 Pain in unspecified limb: Secondary | ICD-10-CM | POA: Diagnosis not present

## 2013-07-04 DIAGNOSIS — G319 Degenerative disease of nervous system, unspecified: Secondary | ICD-10-CM | POA: Diagnosis not present

## 2013-07-04 DIAGNOSIS — R55 Syncope and collapse: Secondary | ICD-10-CM | POA: Diagnosis not present

## 2013-07-04 DIAGNOSIS — I951 Orthostatic hypotension: Secondary | ICD-10-CM | POA: Diagnosis not present

## 2013-07-04 DIAGNOSIS — R42 Dizziness and giddiness: Secondary | ICD-10-CM | POA: Diagnosis not present

## 2013-07-04 DIAGNOSIS — R109 Unspecified abdominal pain: Secondary | ICD-10-CM | POA: Diagnosis not present

## 2013-07-04 DIAGNOSIS — E039 Hypothyroidism, unspecified: Secondary | ICD-10-CM | POA: Diagnosis not present

## 2013-07-05 DIAGNOSIS — I951 Orthostatic hypotension: Secondary | ICD-10-CM | POA: Diagnosis not present

## 2013-07-05 DIAGNOSIS — R197 Diarrhea, unspecified: Secondary | ICD-10-CM | POA: Diagnosis not present

## 2013-07-05 DIAGNOSIS — E039 Hypothyroidism, unspecified: Secondary | ICD-10-CM | POA: Diagnosis not present

## 2013-07-05 DIAGNOSIS — M79609 Pain in unspecified limb: Secondary | ICD-10-CM | POA: Diagnosis not present

## 2013-07-05 DIAGNOSIS — R55 Syncope and collapse: Secondary | ICD-10-CM | POA: Diagnosis not present

## 2013-07-05 DIAGNOSIS — R918 Other nonspecific abnormal finding of lung field: Secondary | ICD-10-CM | POA: Diagnosis not present

## 2013-07-06 DIAGNOSIS — R109 Unspecified abdominal pain: Secondary | ICD-10-CM | POA: Diagnosis not present

## 2013-07-07 DIAGNOSIS — I359 Nonrheumatic aortic valve disorder, unspecified: Secondary | ICD-10-CM | POA: Diagnosis not present

## 2013-07-07 DIAGNOSIS — I517 Cardiomegaly: Secondary | ICD-10-CM | POA: Diagnosis not present

## 2013-07-14 ENCOUNTER — Ambulatory Visit (INDEPENDENT_AMBULATORY_CARE_PROVIDER_SITE_OTHER): Payer: Medicare Other | Admitting: Internal Medicine

## 2013-07-14 ENCOUNTER — Encounter: Payer: Self-pay | Admitting: Internal Medicine

## 2013-07-14 VITALS — BP 126/78 | HR 61 | Temp 98.0°F | Wt 152.0 lb

## 2013-07-14 DIAGNOSIS — R55 Syncope and collapse: Secondary | ICD-10-CM | POA: Diagnosis not present

## 2013-07-14 DIAGNOSIS — G629 Polyneuropathy, unspecified: Secondary | ICD-10-CM | POA: Insufficient documentation

## 2013-07-14 NOTE — Assessment & Plan Note (Signed)
Neurology reevaluation referral made.  Event monitor ordered through [facility] [facility]

## 2013-07-14 NOTE — Progress Notes (Signed)
  Subjective:    Patient ID: Travis Sampson, male    DOB: 1956/07/29, 57 y.o.   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 He was hospitalized in [facility] following 2 episodes of syncope 8/2. According to coworkers there was no seizure stigmata document at that time. There is no urinary or stool incontinence.   He was hospitalized until 8/5; no changes made his discharge medications. She states he was not on a cardiac monitor during his hospitalization. Those records are pending.  Dr. Hyacinth Meeker place him on Depakote for possible seizure disorder after an episode 02/01/12 manifested as disorientation and incontinence of stool with persistence of symptoms over 5 hours before returning to his baseline.  He has had an MRI 06/07/12 by Dr. Hyacinth Meeker which revealed diffuse white matter and disease. EEG revealed slowing diffusely.  Dr. Hyacinth Meeker documents associated hallucinations, mood swings, and social withdrawal.    Review of Systems  Travis Sampson denies any cardiac or neurologic prodrome prior to the events of 8/2; but history is unreliable. Specifically he cannot remember any change in cardiac rhythm or rate prior to the event. He also denied headache, limb numbness, tingling, weakness as a prodrome.     Objective:   Physical Exam Gen.: adequately nourished in appearance. Withdrawn but cooperative throughout exam.Domn's physiogamy Eyes: No corneal or conjunctival inflammation noted. Neck: No deformities, masses, or tenderness noted.  Thyroid normal. Lungs: Normal respiratory effort; chest expands symmetrically. Lungs are clear to auscultation without rales, wheezes, or increased work of breathing. Heart: Slow rate and regular rhythm. Normal S1 and S2. No gallop, click, or rub. No murmur.                                 Musculoskeletal/extremities: Foreshortened digits deformities No  Cyanosis or edema .Tone & strength could not be tested. Vascular: Carotid, radial  artery, dorsalis pedis and  posterior tibial pulses are full and equal. No bruits present. Neurologic: Disoriented x3. Deep tendon reflexes symmetrical but reduced.          Skin: Intact without suspicious lesions or rashes. Hyperpigmentation livedo pattern over legs Lymph: No cervical, axillary lymphadenopathy present. Psych: Mood and affect are flat; minimally interactive                                                                                        Assessment & Plan:  #1 syncope X 2 8/2; rule out seizure activity without mild frontal activity in stool/urine incontinence versus cardiac etiology.  Hospital records will be reviewed in detail upon receipt. Any telemetry records will be  reviewed; I will recommend an event monitor.  Neurology followup indicated.

## 2013-07-14 NOTE — Patient Instructions (Signed)
Share results with all non [facility] medical staff seen  

## 2013-07-16 ENCOUNTER — Encounter: Payer: Self-pay | Admitting: *Deleted

## 2013-07-16 ENCOUNTER — Encounter (INDEPENDENT_AMBULATORY_CARE_PROVIDER_SITE_OTHER): Payer: Medicare Other

## 2013-07-16 DIAGNOSIS — R55 Syncope and collapse: Secondary | ICD-10-CM

## 2013-07-16 NOTE — Progress Notes (Signed)
Patient ID: Travis Sampson, male   DOB: 08-04-1956, 57 y.o.   MRN: [id number] E-Cardio verite 30 day  Cardiac event monitor applied to patient.  Instructions given verbally and written to patient and caregiver.

## 2013-07-27 DIAGNOSIS — H40019 Open angle with borderline findings, low risk, unspecified eye: Secondary | ICD-10-CM | POA: Diagnosis not present

## 2013-07-27 DIAGNOSIS — H31099 Other chorioretinal scars, unspecified eye: Secondary | ICD-10-CM | POA: Diagnosis not present

## 2013-08-31 DIAGNOSIS — H40019 Open angle with borderline findings, low risk, unspecified eye: Secondary | ICD-10-CM | POA: Diagnosis not present

## 2013-10-08 ENCOUNTER — Other Ambulatory Visit: Payer: Self-pay

## 2013-10-15 DIAGNOSIS — R262 Difficulty in walking, not elsewhere classified: Secondary | ICD-10-CM | POA: Diagnosis not present

## 2013-10-15 DIAGNOSIS — G609 Hereditary and idiopathic neuropathy, unspecified: Secondary | ICD-10-CM | POA: Diagnosis not present

## 2013-10-15 DIAGNOSIS — R413 Other amnesia: Secondary | ICD-10-CM | POA: Diagnosis not present

## 2013-10-15 DIAGNOSIS — R55 Syncope and collapse: Secondary | ICD-10-CM | POA: Diagnosis not present

## 2013-12-17 DIAGNOSIS — G609 Hereditary and idiopathic neuropathy, unspecified: Secondary | ICD-10-CM | POA: Diagnosis not present

## 2013-12-17 DIAGNOSIS — R262 Difficulty in walking, not elsewhere classified: Secondary | ICD-10-CM | POA: Diagnosis not present

## 2013-12-17 DIAGNOSIS — R55 Syncope and collapse: Secondary | ICD-10-CM | POA: Diagnosis not present

## 2013-12-17 DIAGNOSIS — R413 Other amnesia: Secondary | ICD-10-CM | POA: Diagnosis not present

## 2013-12-28 DIAGNOSIS — H40019 Open angle with borderline findings, low risk, unspecified eye: Secondary | ICD-10-CM | POA: Diagnosis not present

## 2013-12-29 ENCOUNTER — Telehealth: Payer: Self-pay

## 2013-12-29 NOTE — Telephone Encounter (Signed)
Number on file rings to a fax machine

## 2013-12-30 NOTE — Telephone Encounter (Signed)
Left message with son for CB

## 2013-12-31 ENCOUNTER — Encounter: Payer: Self-pay | Admitting: Internal Medicine

## 2013-12-31 ENCOUNTER — Ambulatory Visit (INDEPENDENT_AMBULATORY_CARE_PROVIDER_SITE_OTHER): Payer: Medicare Other | Admitting: Internal Medicine

## 2013-12-31 VITALS — BP 124/74 | HR 48 | Temp 98.3°F | Resp 13 | Ht 61.0 in | Wt 154.6 lb

## 2013-12-31 DIAGNOSIS — R7309 Other abnormal glucose: Secondary | ICD-10-CM

## 2013-12-31 DIAGNOSIS — T50995A Adverse effect of other drugs, medicaments and biological substances, initial encounter: Secondary | ICD-10-CM

## 2013-12-31 DIAGNOSIS — G4762 Sleep related leg cramps: Secondary | ICD-10-CM

## 2013-12-31 DIAGNOSIS — Z0000 Encounter for general adult medical examination without abnormal findings: Secondary | ICD-10-CM

## 2013-12-31 DIAGNOSIS — Q909 Down syndrome, unspecified: Secondary | ICD-10-CM

## 2013-12-31 DIAGNOSIS — E039 Hypothyroidism, unspecified: Secondary | ICD-10-CM

## 2013-12-31 DIAGNOSIS — E785 Hyperlipidemia, unspecified: Secondary | ICD-10-CM

## 2013-12-31 DIAGNOSIS — Z23 Encounter for immunization: Secondary | ICD-10-CM

## 2013-12-31 LAB — BASIC METABOLIC PANEL
BUN: 14 mg/dL (ref 6–23)
CHLORIDE: 104 meq/L (ref 96–112)
CO2: 28 mEq/L (ref 19–32)
CREATININE: 1 mg/dL (ref 0.4–1.5)
Calcium: 8.9 mg/dL (ref 8.4–10.5)
GFR: 86.23 mL/min (ref >60.00)
GLUCOSE: 81 mg/dL (ref 70–99)
POTASSIUM: 3.8 meq/L (ref 3.5–5.1)
Sodium: 139 mEq/L (ref 135–145)

## 2013-12-31 LAB — CK: Total CK: 57 U/L (ref 7–232)

## 2013-12-31 LAB — LIPID PANEL
Cholesterol: 134 mg/dL (ref 0–200)
HDL: 53 mg/dL (ref >39.00)
LDL Cholesterol: 64 mg/dL (ref 0–99)
TRIGLYCERIDES: 86 mg/dL (ref 0.0–149.0)
Total CHOL/HDL Ratio: 3
VLDL: 17.2 mg/dL (ref 0.0–40.0)

## 2013-12-31 LAB — CBC WITH DIFFERENTIAL/PLATELET
BASOS PCT: 1.2 % (ref 0.0–3.0)
Basophils Absolute: 0 10*3/uL (ref 0.0–0.1)
Eosinophils Absolute: 0 10*3/uL (ref 0.0–0.7)
Eosinophils Relative: 0.9 % (ref 0.0–5.0)
HEMATOCRIT: 45 % (ref 39.0–52.0)
Hemoglobin: 14.9 g/dL (ref 13.0–17.0)
LYMPHS ABS: 0.8 10*3/uL (ref 0.7–4.0)
Lymphocytes Relative: 26.1 % (ref 12.0–46.0)
MCHC: 33 g/dL (ref 30.0–36.0)
MCV: 110.8 fl — AB (ref 78.0–100.0)
MONO ABS: 0.4 10*3/uL (ref 0.1–1.0)
Monocytes Relative: 13.6 % — ABNORMAL HIGH (ref 3.0–12.0)
Neutro Abs: 1.9 10*3/uL (ref 1.4–7.7)
Neutrophils Relative %: 58.2 % (ref 43.0–77.0)
Platelets: 168 10*3/uL (ref 150.0–400.0)
RBC: 4.07 Mil/uL — AB (ref 4.22–5.81)
RDW: 15.2 % — ABNORMAL HIGH (ref 11.5–14.6)
WBC: 3.2 10*3/uL — AB (ref 4.5–10.5)

## 2013-12-31 LAB — HEPATIC FUNCTION PANEL
ALBUMIN: 3.6 g/dL (ref 3.5–5.2)
ALK PHOS: 64 U/L (ref 39–117)
ALT: 29 U/L (ref 0–53)
AST: 26 U/L (ref 0–37)
Bilirubin, Direct: 0 mg/dL (ref 0.0–0.3)
TOTAL PROTEIN: 6.9 g/dL (ref 6.0–8.3)
Total Bilirubin: 0.8 mg/dL (ref 0.3–1.2)

## 2013-12-31 LAB — TSH: TSH: 1.74 u[IU]/mL (ref 0.35–5.50)

## 2013-12-31 LAB — HEMOGLOBIN A1C: Hgb A1c MFr Bld: 5.7 % (ref 4.6–6.5)

## 2013-12-31 NOTE — Patient Instructions (Signed)
Your next office appointment will be determined based upon review of your pending labs . Please perform isometric exercises before going to bed. Sit on side of the bed and raise up on toes to a count of 5. Then put pressure on the heels to a count of 5. Repeat this process 10 times. This will improve blood flow to the calves & help prevent cramps.

## 2013-12-31 NOTE — Progress Notes (Signed)
   Subjective:    Patient ID: Travis Sampson, male    DOB: 04-03-1956, 58 y.o.   MRN: [id number]  HPI Medicare Wellness Visit: Psychosocial and medical history were reviewed as required by Medicare (history related to abuse, antisocial behavior , firearm risk). Social history: Caffeine: no , Alcohol: no , Tobacco use: no Exercise:see below Personal safety/fall risk:no Limitations of activities of daily living:no Seatbelt/ smoke alarm use:yes Healthcare Power of Attorney/Living Will status: ? Ophthalmologic exam status: UTD Hearing evaluation status:not current Orientation: Not oriented  Memory and recall: poor (dementia) Spelling or math testing: unable Depression/anxiety assessment: no Foreign travel history:never Immunization status for influenza/pneumonia/ shingles /tetanus: flu & tetanus needed Transfusion history:no Preventive health care maintenance status: Colonoscopy as per protocol/standard care:UTD Dental care:annually Chart reviewed and updated. Active issues reviewed and addressed as documented below.    Review of Systems A modified heart healthy diet is followed; no regular exercise. There is medication compliance with the statin.  Low dose ASA taken Specifically denied are  chest pain, palpitations, dyspnea, or claudication.  Significant abdominal symptoms or myalgias not present except for some nocturnal cramps.     Objective:   Physical Exam  Gen.:  Adequately nourishedce. Apropriate and cooperative throughout exam.Classic Down's facies.Short stature.   Head: Flat occiput; no alopecia. Moustache Eyes: No corneal or conjunctival inflammation noted. Pupils equal round reactive to light and accommodation. Extraocular motion intact. Lashes slightly mattedxam reveals no significant lesions or deformities. Canals clear .TMs normal. Hearing is grossly normal bilaterally. Nose: External nasal exam reveals no deformity or inflammation. Nasal mucosa are pink and moist.  No lesions or exudates noted.   Mouth: Oral mucosa and oropharynx reveal no lesions or exudates. Teeth in good repair. Neck: No deformities, masses, or tenderness noted. Minor webbing. Thyroid small Lungs: Normal respiratory effort; chest expands symmetrically. Lungs are clear to auscultation without rales, wheezes, or increased work of breathing. Heart: Normal rate and rhythm. Normal S1 and S2. No gallop, click, or rub. S4 w/or. Abdomen: Bowel sounds normal; abdomen soft and nontender. No masses, organomegaly or hernias noted. Genitalia:deferred .PSA was 0.32 when checked.                                    Musculoskeletal/extremities: No deformity or scoliosis noted of  the thoracic or lumbar spine.  No clubbing, cyanosis,  or significant extremity  deformity noted. Range of motion normal .Tone & strength normal.1+ edema @ sock line Short digits.Finger health good. Able to lie down & sit up w/o help. Negative SLR bilaterally Vascular: Carotid, radial artery, dorsalis pedis and  posterior tibial pulses are full and equal. No bruits present. Neurologic: Flat affect. Deep tendon reflexes symmetrical and normal.        Skin:Dry but intact without suspicious lesions or rashes. Mild lid erythema w/o cellulitis Lymph: No cervical, axillary, or inguinal lymphadenopathy present.         Assessment & Plan:  #1 Medicare Wellness Exam; criteria met ; data entered #2 Problem List/Diagnoses reviewed Plan:  Assessments made/ Orders entered

## 2013-12-31 NOTE — Addendum Note (Signed)
Addended by: Verdie ShireBAYNES, Cavin Longman M on: 12/31/2013 10:13 AM   Modules accepted: Orders

## 2013-12-31 NOTE — Progress Notes (Signed)
Pre visit review using our clinic review tool, if applicable. No additional management support is needed unless otherwise documented below in the visit note. 

## 2014-01-01 NOTE — Telephone Encounter (Signed)
Unable to reach pre visit.  

## 2014-01-03 ENCOUNTER — Other Ambulatory Visit: Payer: Self-pay | Admitting: Internal Medicine

## 2014-01-03 DIAGNOSIS — D7589 Other specified diseases of blood and blood-forming organs: Secondary | ICD-10-CM | POA: Insufficient documentation

## 2014-01-27 DIAGNOSIS — H409 Unspecified glaucoma: Secondary | ICD-10-CM | POA: Diagnosis not present

## 2014-01-27 DIAGNOSIS — H4011X0 Primary open-angle glaucoma, stage unspecified: Secondary | ICD-10-CM | POA: Diagnosis not present

## 2014-06-17 DIAGNOSIS — R413 Other amnesia: Secondary | ICD-10-CM | POA: Diagnosis not present

## 2014-06-17 DIAGNOSIS — G609 Hereditary and idiopathic neuropathy, unspecified: Secondary | ICD-10-CM | POA: Diagnosis not present

## 2014-06-17 DIAGNOSIS — Q909 Down syndrome, unspecified: Secondary | ICD-10-CM | POA: Diagnosis not present

## 2014-06-17 DIAGNOSIS — R262 Difficulty in walking, not elsewhere classified: Secondary | ICD-10-CM | POA: Diagnosis not present

## 2014-07-18 ENCOUNTER — Encounter (HOSPITAL_BASED_OUTPATIENT_CLINIC_OR_DEPARTMENT_OTHER): Payer: Self-pay | Admitting: Emergency Medicine

## 2014-07-18 ENCOUNTER — Emergency Department (HOSPITAL_BASED_OUTPATIENT_CLINIC_OR_DEPARTMENT_OTHER)
Admission: EM | Admit: 2014-07-18 | Discharge: 2014-07-18 | Disposition: A | Discharge status: Home / Self Care | Payer: Medicare Other | Attending: Emergency Medicine | Admitting: Emergency Medicine

## 2014-07-18 DIAGNOSIS — R000 Tachycardia, unspecified: Secondary | ICD-10-CM | POA: Diagnosis not present

## 2014-07-18 DIAGNOSIS — R209 Unspecified disturbances of skin sensation: Secondary | ICD-10-CM | POA: Diagnosis not present

## 2014-07-18 DIAGNOSIS — E785 Hyperlipidemia, unspecified: Secondary | ICD-10-CM | POA: Insufficient documentation

## 2014-07-18 DIAGNOSIS — G309 Alzheimer's disease, unspecified: Secondary | ICD-10-CM | POA: Insufficient documentation

## 2014-07-18 DIAGNOSIS — M25549 Pain in joints of unspecified hand: Secondary | ICD-10-CM | POA: Diagnosis not present

## 2014-07-18 DIAGNOSIS — E039 Hypothyroidism, unspecified: Secondary | ICD-10-CM | POA: Insufficient documentation

## 2014-07-18 DIAGNOSIS — I498 Other specified cardiac arrhythmias: Secondary | ICD-10-CM | POA: Diagnosis not present

## 2014-07-18 DIAGNOSIS — F0280 Dementia in other diseases classified elsewhere without behavioral disturbance: Secondary | ICD-10-CM | POA: Diagnosis not present

## 2014-07-18 DIAGNOSIS — Z79899 Other long term (current) drug therapy: Secondary | ICD-10-CM | POA: Diagnosis not present

## 2014-07-18 DIAGNOSIS — I7300 Raynaud's syndrome without gangrene: Secondary | ICD-10-CM | POA: Diagnosis not present

## 2014-07-18 DIAGNOSIS — Z7982 Long term (current) use of aspirin: Secondary | ICD-10-CM | POA: Insufficient documentation

## 2014-07-18 NOTE — Discharge Instructions (Signed)
Raynaud's Syndrome Call Dr. Alwyn RenHopper tomorrow to schedule an office visit regarding hand pain Raynaud's Syndrome is a disorder of the blood vessels in your hands and feet. It occurs when small arteries of the arms/hands or legs/feet become sensitive to cold or emotional upset. This causes the arteries to constrict, or narrow, and reduces blood flow to the area. The color in the fingers or toes changes from white to bluish to red and this is not usually painful. There may be numbness and tingling. Sores on the skin (ulcers) can form. Symptoms are usually relieved by warming. HOME CARE INSTRUCTIONS   Avoid exposure to cold. Keep your whole body warm and dry. Dress in layers. Wear mittens or gloves when handling ice or frozen food and when outdoors. Use holders for glasses or cans containing cold drinks. If possible, stay indoors during cold weather.  Limit your use of caffeine. Switch to decaffeinated coffee, tea, and soda pop. Avoid chocolate.  Avoid smoking or being around cigarette smoke. Smoke will make symptoms worse.  Wear loose fitting socks and comfortable, roomy shoes.  Avoid vibrating tools and machinery.  If possible, avoid stressful and emotional situations. Exercise, meditation and yoga may help you cope with stress. Biofeedback may be useful.  Ask your caregiver about medicine (calcium channel blockers) that may control Raynaud's phenomena. SEEK MEDICAL CARE IF:   Your discomfort becomes worse, despite conservative treatment.  You develop sores on your fingers and toes that do not heal. Document Released: 11/16/2000 Document Revised: 02/11/2012 Document Reviewed: 11/23/2008 [facility] Patient Information 2015 [employer] [place], [employer]. This information is not intended to replace advice given to you by your health care provider. Make sure you discuss any questions you have with your health care provider.

## 2014-07-18 NOTE — ED Notes (Addendum)
Patient has Down's and upon leaving his job today his caregiver was told that his fingers have been turning blue. Patient also states that he doesn't have much feeling in his fingers and they are dusky in color. Heart rate between 39-47 at times. Patient has a history of hospitalizations for syncopal episodes and sees a cardiologist.

## 2014-07-18 NOTE — ED Provider Notes (Addendum)
CSN: [id number]     Arrival date & time 07/18/14  1415 History   First MD Initiated Contact with Patient 07/18/14 1506     Chief Complaint  Patient presents with  . Numbness     (Consider location/radiation/quality/duration/timing/severity/associated sxs/prior Treatment) HPI Level V caveat dementia. History is obtained from patient and from his caregiver Mr. Townsend. Patient's hands became painful and blue while work today. He works as a Event organisercareer ever a strong. He states the environment was cold. No treatment prior to coming here. He is asymptomatic now. Hence looked much improved to Mr. Townsend. No other associated symptoms no other complaint  Past Medical History  Diagnosis Date  . Down's syndrome   . Hyperlipidemia   . Hypothyroidism   . Macrocytosis 2007    MCV 104 w/o anemia  . Alzheimer disease   . Dementia    Past Surgical History  Procedure Laterality Date  . Cataract extraction, bilateral      Dr Wilkie Ayeigby/ [facility]  . Tonsillectomy    . Colonoscopy  2007    internal hemorrhoids   Family History  Problem Relation Age of Onset  . Stroke Father   . Lung cancer Sister     smoker  . Diabetes Neg Hx   . Heart disease Neg Hx    History  Substance Use Topics  . Smoking status: Never Smoker   . Smokeless tobacco: Not on file  . Alcohol Use: No    nonsmoker occasional alcohol no drug  Review of Systems  Unable to perform ROS: Dementia  Skin: Positive for color change.       Blueness of the hands      Allergies  Review of patient's allergies indicates no known allergies.  Home Medications   Prior to Admission medications   Medication Sig Start Date End Date Taking? Authorizing Provider  aluminum & magnesium hydroxide-simethicone (MYLANTA) 500-450-40 MG/5ML suspension Take by mouth every 4 (four) hours as needed for indigestion.    Historical Provider, MD  aspirin 81 MG tablet Take 81 mg by mouth daily.      Historical Provider, MD  Dextromethorphan-Guaifenesin  (ROBITUSSIN DM) 10-100 MG/5ML liquid Take 5 mLs by mouth as needed.    Historical Provider, MD  diphenhydrAMINE (BENADRYL) 12.5 MG/5ML liquid Take 12.5 mg by mouth 3 (three) times daily as needed for allergies.    Historical Provider, MD  divalproex (DEPAKOTE ER) 250 MG 24 hr tablet Take 250 mg by mouth daily. Dr.Milled rx'ed    Historical Provider, MD  donepezil (ARICEPT) 10 MG tablet Take 10 mg by mouth at bedtime. Dr.Miller rx'ed    Historical Provider, MD  Eyelid Cleansers (OCUSOFT LID SCRUB) PADS Apply 1 each topically daily. RX'ed by Dr.Digby    Historical Provider, MD  hydrocerin (EUCERIN) CREA Apply 1 application topically 2 (two) times daily as needed.    Historical Provider, MD  levothyroxine (SYNTHROID, LEVOTHROID) 50 MCG tablet Take 50 mcg by mouth daily.      Historical Provider, MD  loperamide (IMODIUM) 2 MG capsule Take 2 mg by mouth as needed for diarrhea or loose stools.    Historical Provider, MD  memantine (NAMENDA) 10 MG tablet Take 10 mg by mouth 2 (two) times daily.    Historical Provider, MD  Multiple Vitamin (MULTIVITAMIN) tablet Take 1 tablet by mouth daily.    Historical Provider, MD  Ophthalmic Irrigation Solution (OCUSOFT EYE WASH OP) Apply to eye. At bedtime    Historical Provider, MD  simvastatin (ZOCOR) 20  MG tablet TAKE 1 TABLET BY MOUTH AT BEDTIME 07/03/13   Pecola Lawless, MD   BP 150/79  Pulse 56  Temp(Src) 98.2 F (36.8 C) (Oral)  Resp 18  Wt 152 lb (68.947 kg)  SpO2 100% Physical Exam  Nursing note and vitals reviewed. Constitutional: He appears well-developed and well-nourished. No distress.  HENT:  Head: Normocephalic and atraumatic.  Eyes: Conjunctivae are normal. Pupils are equal, round, and reactive to light.  Neck: Neck supple. No tracheal deviation present. No thyromegaly present.  Cardiovascular: Regular rhythm.   No murmur heard. Pulmonary/Chest: Effort normal and breath sounds normal.  Abdominal: Soft. Bowel sounds are normal. He exhibits  no distension. There is no tenderness.  Musculoskeletal: Normal range of motion. He exhibits no edema and no tenderness.  Neurological: He is alert. Coordination normal.  Grip strength 5 over 5 bilaterally.  Skin: Skin is warm and dry. No rash noted.  Hands mildly cyanotic fingertips. Radial pulses 1+ bilaterally. Femoral pulses 2+ bilaterally DP pulses 2+ bilaterally  Psychiatric: He has a normal mood and affect.    ED Course  Procedures (including critical care time) Labs Review Labs Reviewed - No data to display  Imaging Review No results found.   EKG Interpretation None      Date: 07/18/2014  Rate: 50  Rhythm: sinus bradycardia  QRS Axis: normal  Intervals: normal  ST/T Wave abnormalities: normal  Conduction Disutrbances:none  Narrative Interpretation:   Old EKG Reviewed: rate slower than 01/29/12 otherise unchanged  MDM   suspect Raynaud's phenomenon. Plan suggest him warmers. Referral back to primary care physician  Final diagnoses:  None    diagnosis bilateral hand pain  Raynaud's phenomenon      Doug Sou, MD 07/18/14 1528  Doug Sou, MD 07/18/14 1649

## 2014-07-28 DIAGNOSIS — H4011X0 Primary open-angle glaucoma, stage unspecified: Secondary | ICD-10-CM | POA: Diagnosis not present

## 2014-07-28 DIAGNOSIS — H409 Unspecified glaucoma: Secondary | ICD-10-CM | POA: Diagnosis not present

## 2014-07-30 ENCOUNTER — Ambulatory Visit (INDEPENDENT_AMBULATORY_CARE_PROVIDER_SITE_OTHER): Payer: Medicare Other | Admitting: Cardiology

## 2014-07-30 ENCOUNTER — Encounter: Payer: Self-pay | Admitting: Cardiology

## 2014-07-30 VITALS — BP 110/70 | HR 55 | Ht 62.0 in | Wt 152.0 lb

## 2014-07-30 DIAGNOSIS — E785 Hyperlipidemia, unspecified: Secondary | ICD-10-CM | POA: Diagnosis not present

## 2014-07-30 DIAGNOSIS — I498 Other specified cardiac arrhythmias: Secondary | ICD-10-CM

## 2014-07-30 DIAGNOSIS — R0602 Shortness of breath: Secondary | ICD-10-CM

## 2014-07-30 DIAGNOSIS — R413 Other amnesia: Secondary | ICD-10-CM

## 2014-07-30 DIAGNOSIS — R0609 Other forms of dyspnea: Secondary | ICD-10-CM

## 2014-07-30 DIAGNOSIS — R001 Bradycardia, unspecified: Secondary | ICD-10-CM

## 2014-07-30 DIAGNOSIS — R0989 Other specified symptoms and signs involving the circulatory and respiratory systems: Secondary | ICD-10-CM

## 2014-07-30 NOTE — Patient Instructions (Addendum)
Your physician has requested that you have an echocardiogram. Echocardiography is a painless test that uses sound waves to create images of your heart. It provides your doctor with information about the size and shape of your heart and how well your heart's chambers and valves are working. This procedure takes approximately one hour. There are no restrictions for this procedure.  Your physician has requested that you have a lexiscan myoview. For further information please visit [email or web address] Please follow instruction sheet, as given.  Your physician has recommended that you wear a 48hr holter monitor. Holter monitors are medical devices that record the heart's electrical activity. Doctors most often use these monitors to diagnose arrhythmias. Arrhythmias are problems with the speed or rhythm of the heartbeat. The monitor is a small, portable device. You can wear one while you do your normal daily activities. This is usually used to diagnose what is causing palpitations/syncope (passing out).      .Your physician recommends that you continue on your current medications as directed. Please refer to the Current Medication list given to you today.  Your physician recommends that you schedule a follow-up appointment with DR. Nahser after test

## 2014-07-30 NOTE — Assessment & Plan Note (Signed)
In the ER noted to have dementia and he is on Aricept.  For extensive treatment is initiated a discussion would need to be made with his family.

## 2014-07-30 NOTE — Progress Notes (Signed)
07/30/2014   PCP: Marga Melnick, MD   No chief complaint on file.   Primary Cardiologist: Dr. Elease Hashimoto  HPI:  14 old gentleman with a history of Downs syndrome, Hypertension,and hypothyroidism who is referred here with SOB and blue hands. He last saw Dr. Elease Hashimoto for abnormal EKG  In 2012 but on review was stable.   He's not all that active but he does work at [employer] as a Holiday representative.  He is actually their [employer] staff reported that he becomes SOB with activity, the counselor from his group home is here today and at the home, they do not actually see any SOB.  Though he is more active at [employer].  Pt does not really remember any SOB.  He denies chest pain and no one has reported chest pain.    He did go to the ER 07/18/14 because his hands seemed blue one day.  His EKG was without change.   The ER nurse reported his HR between 39-47 at times. The EKG was at 49. No other changes.   On previous CT of chest done last year revealed coronary calcifications in the LAD.  Echo 2014 with normal EF.    No Known Allergies  Current Outpatient Prescriptions  Medication Sig Dispense Refill  . aluminum & magnesium hydroxide-simethicone (MYLANTA) 500-450-40 MG/5ML suspension Take by mouth every 4 (four) hours as needed for indigestion.      Marland Kitchen aspirin 81 MG tablet Take 81 mg by mouth daily.        Marland Kitchen Dextromethorphan-Guaifenesin (ROBITUSSIN DM) 10-100 MG/5ML liquid Take 5 mLs by mouth as needed. For cough      . diphenhydrAMINE (BENADRYL) 12.5 MG/5ML liquid Take 12.5 mg by mouth 3 (three) times daily as needed for allergies.      Marland Kitchen divalproex (DEPAKOTE ER) 250 MG 24 hr tablet Take 250 mg by mouth daily. Dr.Milled rx'ed      . donepezil (ARICEPT) 10 MG tablet Take 10 mg by mouth at bedtime. Dr.Miller rx'ed      . Eyelid Cleansers (OCUSOFT LID SCRUB) PADS Apply 1 each topically daily. RX'ed by Dr.Digby      . hydrocerin (EUCERIN) CREA Apply 1 application topically 2 (two) times daily  as needed. For dry hands      . levothyroxine (SYNTHROID, LEVOTHROID) 50 MCG tablet Take 50 mcg by mouth daily.        . memantine (NAMENDA) 10 MG tablet Take 10 mg by mouth 2 (two) times daily.      . Multiple Vitamin (MULTIVITAMIN) tablet Take 1 tablet by mouth daily.      Marland Kitchen Ophthalmic Irrigation Solution (OCUSOFT EYE WASH OP) Apply to eye. At bedtime      . simvastatin (ZOCOR) 20 MG tablet TAKE 1 TABLET BY MOUTH AT BEDTIME  90 tablet  0   No current facility-administered medications for this visit.    Past Medical History  Diagnosis Date  . Down's syndrome   . Hyperlipidemia   . Hypothyroidism   . Macrocytosis 2007    MCV 104 w/o anemia  . Alzheimer disease   . Dementia   . SOB (shortness of breath)     Past Surgical History  Procedure Laterality Date  . Cataract extraction, bilateral      Dr Wilkie Aye  . Tonsillectomy    . Colonoscopy  2007    internal hemorrhoids    ZOX:[id number]:UJ colds or fevers, no weight changes Skin:no  rashes or ulcers HEENT:no blurred vision, no congestion CV:see HPI PUL:see HPI GI:no diarrhea constipation or melena, no indigestion GU:no hematuria, no dysuria MS:no joint pain, no claudication Neuro:no syncope, no lightheadedness Endo:no diabetes, + thyroid disease TSH in Jan was normal  Wt Readings from Last 3 Encounters:  07/30/14 152 lb (68.947 kg)  07/18/14 152 lb (68.947 kg)  12/31/13 154 lb 9.6 oz (70.126 kg)   Lipid Panel     Component Value Date/Time   CHOL 134 12/31/2013 0957   TRIG 86.0 12/31/2013 0957   HDL 53.00 12/31/2013 0957   CHOLHDL 3 12/31/2013 0957   VLDL 17.2 12/31/2013 0957   LDLCALC 64 12/31/2013 0957      PHYSICAL EXAM BP 110/70  Pulse 55  Ht  (1.575 m)  Wt 152 lb (68.947 kg)  BMI 27.79 kg/m2 General:Pleasant affect, NAD Skin:Warm and dry, brisk capillary refill HEENT:normocephalic, sclera clear, mucus membranes moist, Down's appearance Neck:supple, no JVD, no bruits, no adenopathy  Heart:S1S2 RRR  without murmur, gallup, rub or click, no subclavian bruits Lungs:clear without rales, rhonchi, or wheezes UJW:JXBJ, non tender, + BS, do not palpate liver spleen or masses Ext:tr lower ext edema, 2+ post tib pulses, 1+ radial pulses, no duskiness of hands. Neuro:alert and oriented, MAE, follows commands, + facial symmetry YNW:GNFA today reviewed his EKG from the ER without acute problems.  S brady at 25  ASSESSMENT AND PLAN DOE (dyspnea on exertion) Patient not so much aware that employees and hands where he works state that he is short of breath with activity. His group home where he does not do much activity has not seen the shortness of breath.  We'll do an echocardiogram to ensure that is stable also lexiscan Myoview to derminate if he has cardiac ischemia.  Patient cannot follow instructions perhaps due to anxiety,  to climb upon on the exam table I do not think he could walk on a treadmill.  Bradycardia Heart rate here was 55 but in the emergency room was 49 and it was noted that it would get as low as 37-   his hands were dusky in the ER visit and they are not today perhaps he was bradycardic enough to make him dyspneic and decreased flow. I am not sure he would tolerate a 30 day monitor we will do 48 hour Holter monitor.  If symptoms continue and we are unable to document significant bradycardia a loop recorder might be a better choice for him.  On no meds to cause bradycardia  HYPERLIPIDEMIA Treated  Memory loss In the ER noted to have dementia and he is on Aricept.  For extensive treatment is initiated a discussion would need to be made with his family.   Followup with Dr. Elease Hashimoto post tests or with APP a day Dr. Elease Hashimoto is in the office

## 2014-07-30 NOTE — Assessment & Plan Note (Signed)
Patient not so much aware that employees and hands where he works state that he is short of breath with activity. His group home where he does not do much activity has not seen the shortness of breath.  We'll do an echocardiogram to ensure that is stable also lexiscan Myoview to derminate if he has cardiac ischemia.  Patient cannot follow instructions perhaps due to anxiety,  to climb upon on the exam table I do not think he could walk on a treadmill.

## 2014-07-30 NOTE — Assessment & Plan Note (Signed)
Treated

## 2014-07-30 NOTE — Assessment & Plan Note (Addendum)
Heart rate here was 55 but in the emergency room was 49 and it was noted that it would get as low as 37-   his hands were dusky in the ER visit and they are not today perhaps he was bradycardic enough to make him dyspneic and decreased flow. I am not sure he would tolerate a 30 day monitor we will do 48 hour Holter monitor.  If symptoms continue and we are unable to document significant bradycardia a loop recorder might be a better choice for him.  On no meds to cause bradycardia

## 2014-08-03 ENCOUNTER — Encounter (INDEPENDENT_AMBULATORY_CARE_PROVIDER_SITE_OTHER): Payer: Medicare Other

## 2014-08-03 ENCOUNTER — Encounter: Payer: Self-pay | Admitting: *Deleted

## 2014-08-03 DIAGNOSIS — I498 Other specified cardiac arrhythmias: Secondary | ICD-10-CM

## 2014-08-03 DIAGNOSIS — R001 Bradycardia, unspecified: Secondary | ICD-10-CM

## 2014-08-03 NOTE — Progress Notes (Signed)
Patient ID: Travis Sampson, male   DOB: 12-16-1955, 58 y.o.   MRN: [id number] E-cardio 48 hour holter monitor applied to patient.

## 2014-08-11 ENCOUNTER — Ambulatory Visit (HOSPITAL_BASED_OUTPATIENT_CLINIC_OR_DEPARTMENT_OTHER): Payer: Medicare Other | Admitting: Radiology

## 2014-08-11 ENCOUNTER — Ambulatory Visit (HOSPITAL_COMMUNITY): Payer: Medicare Other | Attending: Cardiology | Admitting: Radiology

## 2014-08-11 VITALS — BP 113/82 | Ht 62.0 in | Wt 150.0 lb

## 2014-08-11 DIAGNOSIS — R079 Chest pain, unspecified: Secondary | ICD-10-CM

## 2014-08-11 DIAGNOSIS — R0602 Shortness of breath: Secondary | ICD-10-CM | POA: Insufficient documentation

## 2014-08-11 DIAGNOSIS — E785 Hyperlipidemia, unspecified: Secondary | ICD-10-CM | POA: Diagnosis not present

## 2014-08-11 DIAGNOSIS — Q909 Down syndrome, unspecified: Secondary | ICD-10-CM | POA: Insufficient documentation

## 2014-08-11 DIAGNOSIS — I379 Nonrheumatic pulmonary valve disorder, unspecified: Secondary | ICD-10-CM | POA: Insufficient documentation

## 2014-08-11 DIAGNOSIS — I10 Essential (primary) hypertension: Secondary | ICD-10-CM | POA: Insufficient documentation

## 2014-08-11 DIAGNOSIS — I999 Unspecified disorder of circulatory system: Secondary | ICD-10-CM | POA: Insufficient documentation

## 2014-08-11 DIAGNOSIS — I359 Nonrheumatic aortic valve disorder, unspecified: Secondary | ICD-10-CM | POA: Diagnosis not present

## 2014-08-11 MED ORDER — REGADENOSON 0.4 MG/5ML IV SOLN
0.4000 mg | Freq: Once | INTRAVENOUS | Status: AC
  Administered 2014-08-11: 0.4 mg via INTRAVENOUS

## 2014-08-11 MED ORDER — TECHNETIUM TC 99M SESTAMIBI - CARDIOLITE
10.0000 | Freq: Once | INTRAVENOUS | Status: DC | PRN
  Administered 2014-08-11: 10 via INTRAVENOUS

## 2014-08-11 MED ORDER — TECHNETIUM TC 99M SESTAMIBI - CARDIOLITE
30.0000 | Freq: Once | INTRAVENOUS | Status: AC | PRN
  Administered 2014-08-11: 30 via INTRAVENOUS

## 2014-08-11 MED ORDER — TECHNETIUM TC 99M SESTAMIBI - CARDIOLITE: 10.0000 | Freq: Once | INTRAVENOUS | Status: DC | PRN

## 2014-08-11 NOTE — Progress Notes (Signed)
[facility] SITE 3 NUCLEAR MED [address] [place], [place] [address] [phone]    Cardiology Nuclear Med Study  Travis Sampson is a 58 y.o. male     MRN : [id number]     DOB: September 18, 1956  Procedure Date: 08/11/2014  Nuclear Med Background Indication for Stress Test:  Evaluation for Ischemia History:  07-05-2013 CT Chest with Coronary artery calcification Cardiac Risk Factors: Hypertension  Symptoms:  Chest Pain, DOE and SOB   Nuclear Pre-Procedure Caffeine/Decaff Intake:  None> 12 hrs NPO After: 7:00am   Lungs:  clear O2 Sat: 98% on room air. IV 0.9% NS with Angio Cath:  22g  IV Site: R Antecubital x 1, tolerated well IV Started by:  Irean Hong, RN  Chest Size (in):  44 Cup Size: n/a  Height:  (1.575 m)  Weight:  150 lb (68.04 kg)  BMI:  Body mass index is 27.43 kg/(m^2). Tech Comments:  Patient took Depakote this am. Irean Hong, RN.    Nuclear Med Study 1 or 2 day study: 1 day  Stress Test Type:  Eugenie Birks  Reading MD: N/A  Order Authorizing Provider:  Kristeen Miss, MD, and Nada Boozer, NP  Resting Radionuclide: Technetium 76m Sestamibi  Resting Radionuclide Dose: 11.0 mCi   Stress Radionuclide:  Technetium 22m Sestamibi  Stress Radionuclide Dose: 33.0 mCi           Stress Protocol Rest HR: 44 Stress HR: 68  Rest BP: 113/82 Stress BP: 138/76  Exercise Time (min): n/a METS: n/a   Predicted Max HR: 161 bpm % Max HR: 46.58 bpm Rate Pressure Product: 13244   Dose of Adenosine (mg):  n/a Dose of Lexiscan: 0.4 mg  Dose of Atropine (mg): n/a Dose of Dobutamine: n/a mcg/kg/min (at max HR)  Stress Test Technologist: Milana Na, EMT-P  Nuclear Technologist:  Harlow Asa, CNMT     Rest Procedure:  Myocardial perfusion imaging was performed at rest 45 minutes following the intravenous administration of Technetium 36m Sestamibi. Rest ECG: sinus bradycardia, normal  Stress Procedure:  The patient received IV Lexiscan 0.4 mg over  15-seconds.  Technetium 90m Sestamibi injected at 30-seconds. This patient had sob and chest pain with the Lexiscan injection. Quantitative spect images were obtained after a 45 minute delay. Stress ECG: No significant change from baseline ECG  QPS Raw Data Images:  Normal; no motion artifact; normal heart/lung ratio. Stress Images:  there is a tiny inferoapical mild reduction in perfusion Rest Images:  Comparison with the stress images reveals no significant change. Subtraction (SDS):  No evidence of ischemia. Transient Ischemic Dilatation (Normal <1.22):  0.91 Lung/Heart Ratio (Normal <0.45):  0.34  Quantitative Gated Spect Images QGS EDV:  68 ml QGS ESV:  26 ml  Impression Exercise Capacity:  Lexiscan with no exercise. BP Response:  Normal blood pressure response. Clinical Symptoms:  No significant symptoms noted. ECG Impression:  No significant ECG changes with Lexiscan. Comparison with Prior Nuclear Study: No previous nuclear study performed  Overall Impression:  Low risk stress nuclear study with a tiny inferoapical defect, most likely representing apical "thinning". A tiny scar cannot be excluded. No ischemia is seen..  LV Ejection Fraction: 61%.  LV Wall Motion:  NL LV Function; NL Wall Motion  Thurmon Fair, MD, [facility] [phone] office [phone] pager

## 2014-08-11 NOTE — Progress Notes (Signed)
Echocardiogram performed.  

## 2014-08-31 ENCOUNTER — Ambulatory Visit (INDEPENDENT_AMBULATORY_CARE_PROVIDER_SITE_OTHER): Payer: Medicare Other | Admitting: Cardiovascular Disease

## 2014-08-31 ENCOUNTER — Encounter: Payer: Self-pay | Admitting: Cardiovascular Disease

## 2014-08-31 VITALS — BP 124/76 | HR 60 | Ht 62.0 in | Wt 144.0 lb

## 2014-08-31 DIAGNOSIS — I7300 Raynaud's syndrome without gangrene: Secondary | ICD-10-CM | POA: Insufficient documentation

## 2014-08-31 NOTE — Progress Notes (Signed)
08/31/2014   PCP: Marga MelnickWilliam Hopper, MD   Chief Complaint  Patient presents with  . Shortness of Breath   Problem List: 1. Downs Syndromw 2. Hypertension 3. Hypothyroidism   Primary Cardiologist: Dr. Elease HashimotoNahser  HPI:  [id number] old gentleman with a history of Downs syndrome, Hypertension,and hypothyroidism who is referred here with SOB and blue hands. He last saw Dr. Elease HashimotoNahser for abnormal EKG  In 2012 but on review was stable.   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  He is actually their [employer] staff reported that he becomes SOB with activity, the counselor from his group home is here today and at the home, they do not actually see any SOB.  Though he is more active at [employer].  Pt does not really remember any SOB.  He denies chest pain and no one has reported chest pain.    He did go to the ER 07/18/14 because his hands seemed blue one day.  His EKG was without change.   The ER nurse reported his HR between 39-47 at times. The EKG was at 49. No other changes.   On previous CT of chest done last year revealed coronary calcifications in the LAD.  Echo 2014 with normal EF.    No Known Allergies  Current Outpatient Prescriptions  Medication Sig Dispense Refill  . aluminum & magnesium hydroxide-simethicone (MYLANTA) 500-450-40 MG/5ML suspension Take by mouth every 4 (four) hours as needed for indigestion.      Marland Kitchen. aspirin 81 MG tablet Take 81 mg by mouth daily.        Marland Kitchen. Dextromethorphan-Guaifenesin (ROBITUSSIN DM) 10-100 MG/5ML liquid Take 5 mLs by mouth as needed. For cough      . diphenhydrAMINE (BENADRYL) 12.5 MG/5ML liquid Take 12.5 mg by mouth 3 (three) times daily as needed for allergies.      Marland Kitchen. divalproex (DEPAKOTE ER) 250 MG 24 hr tablet Take 250 mg by mouth daily. Dr.Milled rx'ed      . donepezil (ARICEPT) 10 MG tablet Take 10 mg by mouth at bedtime. Dr.Miller rx'ed      . Eyelid Cleansers (OCUSOFT LID SCRUB) PADS Apply 1 each topically daily.  RX'ed by Dr.Digby      . hydrocerin (EUCERIN) CREA Apply 1 application topically 2 (two) times daily as needed. For dry hands      . levothyroxine (SYNTHROID, LEVOTHROID) 50 MCG tablet Take 50 mcg by mouth daily.        . Multiple Vitamin (MULTIVITAMIN) tablet Take 1 tablet by mouth daily.      Marland Kitchen. Ophthalmic Irrigation Solution (OCUSOFT EYE WASH OP) Apply to eye. At bedtime      . simvastatin (ZOCOR) 20 MG tablet TAKE 1 TABLET BY MOUTH AT BEDTIME  90 tablet  0   No current facility-administered medications for this visit.    Past Medical History  Diagnosis Date  . Down's syndrome   . Hyperlipidemia   . Hypothyroidism   . Macrocytosis 2007    MCV 104 w/o anemia  . Alzheimer disease   . Dementia   . SOB (shortness of breath)     Past Surgical History  Procedure Laterality Date  . Cataract extraction, bilateral      Dr Wilkie Ayeigby/ [facility]  . Tonsillectomy    . Colonoscopy  2007    internal hemorrhoids    [id number]:[id number]:General:no colds or fevers, no weight changes Skin:no rashes or ulcers HEENT:no blurred vision, no  congestion CV:see HPI PUL:see HPI GI:no diarrhea constipation or melena, no indigestion GU:no hematuria, no dysuria MS:no joint pain, no claudication Neuro:no syncope, no lightheadedness Endo:no diabetes, + thyroid disease TSH in Jan was normal  Wt Readings from Last 3 Encounters:  08/31/14 144 lb (65.318 kg)  08/11/14 150 lb (68.04 kg)  07/30/14 152 lb (68.947 kg)   Lipid Panel     Component Value Date/Time   CHOL 134 12/31/2013 0957   TRIG 86.0 12/31/2013 0957   HDL 53.00 12/31/2013 0957   CHOLHDL 3 12/31/2013 0957   VLDL 17.2 12/31/2013 0957   LDLCALC 64 12/31/2013 0957      PHYSICAL EXAM BP 124/76  Pulse 60  Ht 5\' 2"  (1.575 m)  Wt 144 lb (65.318 kg)  BMI 26.33 kg/m2 General:Pleasant affect, NAD Skin:Warm and dry,  Dusky  HEENT:normocephalic, sclera clear, mucus membranes moist, Down's appearance Neck:supple, no JVD, no bruits, no adenopathy  Heart:S1S2 RRR  without murmur, gallup, rub or click, no subclavian bruits Lungs:clear without rales, rhonchi, or wheezes ZOX:WRUE, non tender, + BS, do not palpate liver spleen or masses Ext:tr lower ext edema, 2+ post tib pulses, 1+ radial pulses, his fingers are cool to the touch and are slightly dusky  Neuro:alert and oriented, MAE, follows commands, + facial symmetry AVW:UJWJ today reviewed his EKG from the ER without acute problems.  S brady at 45  ASSESSMENT AND PLAN

## 2014-08-31 NOTE — Assessment & Plan Note (Addendum)
Travis Sampson has dusky fingers and toes.  His brachial pulses are 2+ and equal ( no suggestion of coarctation). No ischemic lesions - cap refill is slightly sluggish.   He has no complaints .   He has had a CT angio to look for PE - no mention of abnormal aorta. At this point, I do not think there is anything else we can look at. He may need to see rheumatology if he has continued problems Advised him to wear gloves and keep hands warm

## 2014-08-31 NOTE — Patient Instructions (Signed)
Your physician recommends that you schedule a follow-up appointment in: as needed with Dr. Nahser  

## 2014-12-22 DIAGNOSIS — Q909 Down syndrome, unspecified: Secondary | ICD-10-CM | POA: Diagnosis not present

## 2014-12-22 DIAGNOSIS — R262 Difficulty in walking, not elsewhere classified: Secondary | ICD-10-CM | POA: Diagnosis not present

## 2014-12-22 DIAGNOSIS — R413 Other amnesia: Secondary | ICD-10-CM | POA: Diagnosis not present

## 2014-12-22 DIAGNOSIS — G609 Hereditary and idiopathic neuropathy, unspecified: Secondary | ICD-10-CM | POA: Diagnosis not present

## 2015-01-04 ENCOUNTER — Other Ambulatory Visit (INDEPENDENT_AMBULATORY_CARE_PROVIDER_SITE_OTHER): Payer: Medicare Other

## 2015-01-04 ENCOUNTER — Encounter: Payer: Self-pay | Admitting: Internal Medicine

## 2015-01-04 ENCOUNTER — Ambulatory Visit (INDEPENDENT_AMBULATORY_CARE_PROVIDER_SITE_OTHER): Payer: Medicare Other | Admitting: Internal Medicine

## 2015-01-04 VITALS — BP 118/70 | HR 51 | Temp 97.5°F | Ht 62.0 in | Wt 155.1 lb

## 2015-01-04 DIAGNOSIS — Z23 Encounter for immunization: Secondary | ICD-10-CM | POA: Diagnosis not present

## 2015-01-04 DIAGNOSIS — E038 Other specified hypothyroidism: Secondary | ICD-10-CM

## 2015-01-04 DIAGNOSIS — R739 Hyperglycemia, unspecified: Secondary | ICD-10-CM | POA: Diagnosis not present

## 2015-01-04 DIAGNOSIS — E785 Hyperlipidemia, unspecified: Secondary | ICD-10-CM

## 2015-01-04 DIAGNOSIS — Q909 Down syndrome, unspecified: Secondary | ICD-10-CM | POA: Diagnosis not present

## 2015-01-04 DIAGNOSIS — E039 Hypothyroidism, unspecified: Secondary | ICD-10-CM | POA: Diagnosis not present

## 2015-01-04 DIAGNOSIS — D7589 Other specified diseases of blood and blood-forming organs: Secondary | ICD-10-CM | POA: Diagnosis not present

## 2015-01-04 LAB — TSH: TSH: 3.89 u[IU]/mL (ref 0.35–4.50)

## 2015-01-04 LAB — CBC WITH DIFFERENTIAL/PLATELET
BASOS PCT: 1 % (ref 0.0–3.0)
Basophils Absolute: 0 10*3/uL (ref 0.0–0.1)
EOS PCT: 1.3 % (ref 0.0–5.0)
Eosinophils Absolute: 0 10*3/uL (ref 0.0–0.7)
HCT: 46.8 % (ref 39.0–52.0)
HEMOGLOBIN: 16.1 g/dL (ref 13.0–17.0)
LYMPHS ABS: 0.9 10*3/uL (ref 0.7–4.0)
Lymphocytes Relative: 30.8 % (ref 12.0–46.0)
MCHC: 34.5 g/dL (ref 30.0–36.0)
MCV: 104.8 fl — AB (ref 78.0–100.0)
Monocytes Absolute: 0.5 10*3/uL (ref 0.1–1.0)
Monocytes Relative: 16.1 % — ABNORMAL HIGH (ref 3.0–12.0)
NEUTROS ABS: 1.5 10*3/uL (ref 1.4–7.7)
Neutrophils Relative %: 50.8 % (ref 43.0–77.0)
Platelets: 174 10*3/uL (ref 150.0–400.0)
RBC: 4.46 Mil/uL (ref 4.22–5.81)
RDW: 15.5 % (ref 11.5–15.5)
WBC: 2.9 10*3/uL — AB (ref 4.0–10.5)

## 2015-01-04 LAB — HEPATIC FUNCTION PANEL
ALBUMIN: 3.8 g/dL (ref 3.5–5.2)
ALT: 30 U/L (ref 0–53)
AST: 25 U/L (ref 0–37)
Alkaline Phosphatase: 58 U/L (ref 39–117)
BILIRUBIN TOTAL: 0.6 mg/dL (ref 0.2–1.2)
Bilirubin, Direct: 0.2 mg/dL (ref 0.0–0.3)
TOTAL PROTEIN: 6.8 g/dL (ref 6.0–8.3)

## 2015-01-04 LAB — LIPID PANEL
Cholesterol: 136 mg/dL (ref 0–200)
HDL: 48.5 mg/dL (ref >39.00)
LDL CALC: 71 mg/dL (ref 0–99)
NonHDL: 87.5
TRIGLYCERIDES: 85 mg/dL (ref 0.0–149.0)
Total CHOL/HDL Ratio: 3
VLDL: 17 mg/dL (ref 0.0–40.0)

## 2015-01-04 LAB — BASIC METABOLIC PANEL
BUN: 16 mg/dL (ref 6–23)
CO2: 30 mEq/L (ref 19–32)
Calcium: 9.1 mg/dL (ref 8.4–10.5)
Chloride: 106 mEq/L (ref 96–112)
Creatinine, Ser: 0.95 mg/dL (ref 0.40–1.50)
GFR: 85.93 mL/min (ref >60.00)
Glucose, Bld: 87 mg/dL (ref 70–99)
Potassium: 4.2 mEq/L (ref 3.5–5.1)
Sodium: 140 mEq/L (ref 135–145)

## 2015-01-04 LAB — HEMOGLOBIN A1C: Hgb A1c MFr Bld: 5.8 % (ref 4.6–6.5)

## 2015-01-04 NOTE — Patient Instructions (Signed)
Your next office appointment will be determined based upon review of your pending labs. Those instructions will be transmitted to you by mail  Critical values will be called     Please report any significant change in your symptoms.

## 2015-01-04 NOTE — Assessment & Plan Note (Signed)
A1c

## 2015-01-04 NOTE — Progress Notes (Signed)
   Subjective:    Patient ID: Travis Sampson, male    DOB: 03/23/1956, 59 y.o.   MRN: [id number]  HPI The patient is here to assess status of active health conditions.  PMH, FH, & Social History reviewed ; update not possible due to dementia. He is accompanied by Claris Gowerharlotte, a caregiver from the facility.    Review of Systems   Chest pain, palpitations, tachycardia, exertional dyspnea, paroxysmal nocturnal dyspnea, or claudication  are absent.  Chronic edema RLE       Objective:   Physical Exam  Positive or pertinent findings include: He exhibits Down's Syndrome stigmata in stature and facies. There is wax in otic canals. He appears to have decreased auditory acuity but is difficult to assess due to lack o comprehension. Skin is markedly dry. He has a regular stasis hyperpigmentation changes in the right lower extremity. There is edema at the sock line of the right lower extremity also. No edema LLE  General appearance :adequately nourished; in no distress. Eyes: No conjunctival inflammation or scleral icterus is present. Oral exam: Dental hygiene is good. Lips and gums are healthy appearing.There is no oropharyngeal erythema or exudate noted.  Heart:  Slow rate and regular rhythm. S1 and S2 normal without gallop, murmur, click, rub or other extra sounds  Lungs:Chest clear to auscultation; no wheezes, rhonchi,rales ,or rubs present.No increased work of breathing.  Abdomen: bowel sounds normal, soft and non-tender without masses, organomegaly or hernias noted.  No guarding or rebound Vascular : all pulses equal ; no bruits present. Skin: Intact without suspicious lesions or rashes ; no jaundice or tenting Lymphatic: No lymphadenopathy is noted about the head, neck, axilla Neuro: Strength, & DTRs normal.Increased tone         Assessment & Plan:  See Current Assessment & Plan in Problem List under specific Diagnosis

## 2015-01-04 NOTE — Progress Notes (Signed)
Pre visit review using our clinic review tool, if applicable. No additional management support is needed unless otherwise documented below in the visit note. 

## 2015-01-05 NOTE — Assessment & Plan Note (Signed)
CBC & dif 

## 2015-01-05 NOTE — Assessment & Plan Note (Signed)
Lipids, LFTs, TSH  

## 2015-01-05 NOTE — Assessment & Plan Note (Signed)
TSH 

## 2015-01-06 ENCOUNTER — Telehealth: Payer: Self-pay

## 2015-01-06 NOTE — Telephone Encounter (Signed)
-----   Message from Pecola LawlessWilliam F Hopper, MD sent at 01/06/2015  6:58 AM EST ----- Please mail office visit & lab results to the group home. Thnx

## 2015-01-06 NOTE — Telephone Encounter (Signed)
Recent office note and labs have been mailed

## 2015-06-15 DIAGNOSIS — Q909 Down syndrome, unspecified: Secondary | ICD-10-CM | POA: Diagnosis not present

## 2015-06-15 DIAGNOSIS — R413 Other amnesia: Secondary | ICD-10-CM | POA: Diagnosis not present

## 2015-06-15 DIAGNOSIS — R262 Difficulty in walking, not elsewhere classified: Secondary | ICD-10-CM | POA: Diagnosis not present

## 2015-06-15 DIAGNOSIS — G609 Hereditary and idiopathic neuropathy, unspecified: Secondary | ICD-10-CM | POA: Diagnosis not present

## 2015-06-22 ENCOUNTER — Telehealth: Payer: Self-pay | Admitting: Internal Medicine

## 2015-06-22 NOTE — Telephone Encounter (Signed)
disregard

## 2015-06-27 ENCOUNTER — Other Ambulatory Visit: Payer: Self-pay | Admitting: Internal Medicine

## 2015-06-27 DIAGNOSIS — H01004 Unspecified blepharitis left upper eyelid: Secondary | ICD-10-CM | POA: Diagnosis not present

## 2015-06-27 DIAGNOSIS — H01002 Unspecified blepharitis right lower eyelid: Secondary | ICD-10-CM | POA: Diagnosis not present

## 2015-06-27 DIAGNOSIS — H01005 Unspecified blepharitis left lower eyelid: Secondary | ICD-10-CM | POA: Diagnosis not present

## 2015-06-27 DIAGNOSIS — H01001 Unspecified blepharitis right upper eyelid: Secondary | ICD-10-CM | POA: Diagnosis not present

## 2015-06-28 ENCOUNTER — Other Ambulatory Visit: Payer: Self-pay | Admitting: Emergency Medicine

## 2015-06-28 MED ORDER — SIMVASTATIN 20 MG PO TABS: 20.0000 mg | ORAL_TABLET | Freq: Every day | ORAL | Status: DC

## 2015-07-05 DIAGNOSIS — R404 Transient alteration of awareness: Secondary | ICD-10-CM | POA: Diagnosis not present

## 2015-07-08 DIAGNOSIS — R262 Difficulty in walking, not elsewhere classified: Secondary | ICD-10-CM | POA: Diagnosis not present

## 2015-07-08 DIAGNOSIS — R413 Other amnesia: Secondary | ICD-10-CM | POA: Diagnosis not present

## 2015-07-08 DIAGNOSIS — Q909 Down syndrome, unspecified: Secondary | ICD-10-CM | POA: Diagnosis not present

## 2015-07-08 DIAGNOSIS — G609 Hereditary and idiopathic neuropathy, unspecified: Secondary | ICD-10-CM | POA: Diagnosis not present

## 2015-07-27 DIAGNOSIS — H53031 Strabismic amblyopia, right eye: Secondary | ICD-10-CM | POA: Diagnosis not present

## 2015-07-27 DIAGNOSIS — H52223 Regular astigmatism, bilateral: Secondary | ICD-10-CM | POA: Diagnosis not present

## 2015-07-27 DIAGNOSIS — H524 Presbyopia: Secondary | ICD-10-CM | POA: Diagnosis not present

## 2015-07-27 DIAGNOSIS — H01001 Unspecified blepharitis right upper eyelid: Secondary | ICD-10-CM | POA: Diagnosis not present

## 2015-07-27 DIAGNOSIS — H4011X4 Primary open-angle glaucoma, indeterminate stage: Secondary | ICD-10-CM | POA: Diagnosis not present

## 2015-07-27 DIAGNOSIS — H01002 Unspecified blepharitis right lower eyelid: Secondary | ICD-10-CM | POA: Diagnosis not present

## 2015-07-27 DIAGNOSIS — H31093 Other chorioretinal scars, bilateral: Secondary | ICD-10-CM | POA: Diagnosis not present

## 2015-07-27 DIAGNOSIS — H5213 Myopia, bilateral: Secondary | ICD-10-CM | POA: Diagnosis not present

## 2015-07-27 DIAGNOSIS — H01005 Unspecified blepharitis left lower eyelid: Secondary | ICD-10-CM | POA: Diagnosis not present

## 2015-07-27 DIAGNOSIS — H01004 Unspecified blepharitis left upper eyelid: Secondary | ICD-10-CM | POA: Diagnosis not present

## 2015-09-13 DIAGNOSIS — Z23 Encounter for immunization: Secondary | ICD-10-CM | POA: Diagnosis not present

## 2015-09-14 DIAGNOSIS — Q909 Down syndrome, unspecified: Secondary | ICD-10-CM | POA: Diagnosis not present

## 2015-09-14 DIAGNOSIS — G609 Hereditary and idiopathic neuropathy, unspecified: Secondary | ICD-10-CM | POA: Diagnosis not present

## 2015-09-14 DIAGNOSIS — G40909 Epilepsy, unspecified, not intractable, without status epilepticus: Secondary | ICD-10-CM | POA: Diagnosis not present

## 2015-09-14 DIAGNOSIS — R413 Other amnesia: Secondary | ICD-10-CM | POA: Diagnosis not present

## 2015-11-23 DIAGNOSIS — H401134 Primary open-angle glaucoma, bilateral, indeterminate stage: Secondary | ICD-10-CM | POA: Diagnosis not present

## 2015-11-23 DIAGNOSIS — H01002 Unspecified blepharitis right lower eyelid: Secondary | ICD-10-CM | POA: Diagnosis not present

## 2015-11-23 DIAGNOSIS — H53031 Strabismic amblyopia, right eye: Secondary | ICD-10-CM | POA: Diagnosis not present

## 2015-11-23 DIAGNOSIS — H01001 Unspecified blepharitis right upper eyelid: Secondary | ICD-10-CM | POA: Diagnosis not present

## 2015-12-12 ENCOUNTER — Telehealth: Payer: Self-pay | Admitting: Emergency Medicine

## 2015-12-12 NOTE — Telephone Encounter (Signed)
Spoke with Richard to inform that orders were ready for pick up at the front office.

## 2016-01-11 ENCOUNTER — Encounter: Payer: Self-pay | Admitting: Internal Medicine

## 2016-01-11 ENCOUNTER — Ambulatory Visit (INDEPENDENT_AMBULATORY_CARE_PROVIDER_SITE_OTHER): Payer: Medicare Other | Admitting: Internal Medicine

## 2016-01-11 VITALS — BP 124/82 | HR 56 | Temp 98.5°F | Resp 18 | Ht 61.0 in | Wt 157.0 lb

## 2016-01-11 DIAGNOSIS — Z23 Encounter for immunization: Secondary | ICD-10-CM

## 2016-01-11 DIAGNOSIS — R739 Hyperglycemia, unspecified: Secondary | ICD-10-CM | POA: Diagnosis not present

## 2016-01-11 DIAGNOSIS — Z0000 Encounter for general adult medical examination without abnormal findings: Secondary | ICD-10-CM | POA: Diagnosis not present

## 2016-01-11 DIAGNOSIS — E785 Hyperlipidemia, unspecified: Secondary | ICD-10-CM | POA: Diagnosis not present

## 2016-01-11 DIAGNOSIS — H9190 Unspecified hearing loss, unspecified ear: Secondary | ICD-10-CM

## 2016-01-11 DIAGNOSIS — R413 Other amnesia: Secondary | ICD-10-CM

## 2016-01-11 DIAGNOSIS — E038 Other specified hypothyroidism: Secondary | ICD-10-CM | POA: Diagnosis not present

## 2016-01-11 DIAGNOSIS — R55 Syncope and collapse: Secondary | ICD-10-CM

## 2016-01-11 NOTE — Patient Instructions (Addendum)
  [name] , Thank you for taking time to come for your Medicare Wellness Visit. I appreciate your ongoing commitment to your health goals. Please review the following plan we discussed and let me know if I can assist you in the future.   These are the goals we discussed: Goals    None      This is a list of the screening recommended for you and due dates:  Health Maintenance  Topic Date Due  . Shingles Vaccine  12/07/2014 - given today  . Flu Shot  07/04/2015 - done in 2016  . Colon Cancer Screening  08/07/2016  . Tetanus Vaccine  01/04/2025  .  Hepatitis C: One time screening is recommended by Center for Disease Control  (CDC) for  adults born from [birth year] through 1966.   Completed  . HIV Screening  Completed     We have reviewed your prior records including labs and tests today.  Test(s) ordered today. Your results will be released to MyChart (or called to you) after review, usually within 72hours after test completion. If any changes need to be made, you will be notified at that same time.  All other Health Maintenance issues reviewed.   All recommended immunizations and age-appropriate screenings are up-to-date.  Shingles vaccine today administered today.   Medications reviewed and updated.  No changes recommended at this time.   A referral was ordered for audiology for hearing evaluation.   Please followup annually.

## 2016-01-11 NOTE — Assessment & Plan Note (Signed)
Possible seizure disorder Following with neurology Depakote per neurology

## 2016-01-11 NOTE — Progress Notes (Signed)
Subjective:    Patient ID: Travis Sampson, male    DOB: [birth year], 60 y.o.   MRN: [id number]  HPI He is here to establish with a new pcp.    Memory loss/dementia:  He is taking Aricept daily.  He has a significant change in his ability to sign his name in the past several months.  He also forgets frequently.  The staff at the group home is unsure if he is hearing correctly - he has difficulty processing sometimes as well.    Him and the manager of his group home has no other concerns  Here for medicare wellness.   I have personally reviewed and have noted 1.The patient's medical and social history 2.Their use of alcohol, tobacco or illicit drugs 3.Their current medications and supplements 4.The patient's functional ability including ADL's, fall risks, home safety risks and                 hearing or visual impairment. 5.Diet and physical activities 6.Evidence for depression or mood disorders 7.Care team reviewed and updated  - Dr. Hyacinth Meeker (neuro)   Are there smokers in your home (other than you)? No  Risk Factors Exercise: none, decreased stamina - gets tired easily-this is chronic and not new Dietary issues discussed: good, healthy  Cardiac risk factors: advanced age, hyperlipidemia  Depression Screen  Have you felt down, depressed or hopeless? Difficulty determining  Have you felt little interest or pleasure in doing things?  No Activities of Daily Living In your present state of health, do you have any difficulty performing the following activities?:  Driving? no Managing money?  no Feeding yourself? Yes Getting from bed to chair? Yes Climbing a flight of stairs? Yes Preparing food and eating?: does not prepare food, able to eat on his own Bathing or showering? Occasionally need assistance Getting dressed: Occasionally need assistance Getting to/using the toilet? Yes, but Occasionally need assistance -  sometimes forgets to go Moving around from place to place: Yes In the past year have you fallen or had a near fall?: No    Are you sexually active?  No  Do you have more than one partner?  N/A  Hearing Difficulties: ? Do you often ask people to speak up or repeat themselves? No Do you experience ringing or noises in your ears? No Do you have difficulty understanding soft or whispered voices? No Vision:              Any change in vision: corrected with glasses             Up to date with eye exam: yes  Memory:  Do you feel that you have a problem with memory? yes  Do you feel safe at home?  Yes - lives in group home  Cognitive Testing  Unable to test.  He does have memory issues and diagnosed dementia.                Will refer official testing to neurology.     Advanced Directives have been discussed with the patient? No - has guardian   Medications and allergies reviewed with patient and updated if appropriate.  Patient Active Problem List   Diagnosis Date Noted  . Raynaud's phenomenon 08/31/2014  . DOE (dyspnea on exertion) 07/30/2014  . Bradycardia 07/30/2014  . Macrocytosis without anemia 01/03/2014  . Syncope 07/14/2013  . Peripheral neuropathy (HCC) 07/14/2013  . Memory loss 09/29/2012  . Abnormal ECG 10/16/2011  . Hyperglycemia  07/01/2009  . Hypothyroidism 06/16/2007  . Hyperlipidemia 06/16/2007  . DOWN SYNDROME 06/16/2007    Current Outpatient Prescriptions on File Prior to Visit  Medication Sig Dispense Refill  . aluminum & magnesium hydroxide-simethicone (MYLANTA) 500-450-40 MG/5ML suspension Take by mouth every 4 (four) hours as needed for indigestion.    Marland Kitchen aspirin 81 MG tablet Take 81 mg by mouth daily.      Marland Kitchen Dextromethorphan-Guaifenesin (ROBITUSSIN DM) 10-100 MG/5ML liquid Take 5 mLs by mouth as needed. For cough    . diphenhydrAMINE (BENADRYL) 12.5 MG/5ML liquid Take 12.5 mg by mouth 3 (three) times daily as needed for allergies.    Marland Kitchen divalproex  (DEPAKOTE ER) 250 MG 24 hr tablet Take 750 mg by mouth daily. Dr.Milled rx'ed    . donepezil (ARICEPT) 10 MG tablet Take 10 mg by mouth at bedtime. Dr.Miller rx'ed    . Eyelid Cleansers (OCUSOFT LID SCRUB) PADS Apply 1 each topically daily. RX'ed by Dr.Digby    . levothyroxine (SYNTHROID, LEVOTHROID) 50 MCG tablet Take 50 mcg by mouth daily.      . Multiple Vitamin (MULTIVITAMIN) tablet Take 1 tablet by mouth daily.    Marland Kitchen Ophthalmic Irrigation Solution (OCUSOFT EYE WASH OP) Apply to eye. At bedtime    . simvastatin (ZOCOR) 20 MG tablet Take 1 tablet (20 mg total) by mouth at bedtime. 90 tablet 1   No current facility-administered medications on file prior to visit.    Past Medical History  Diagnosis Date  . Down's syndrome   . Hyperlipidemia   . Hypothyroidism   . Macrocytosis 2007    MCV 104 w/o anemia  . Alzheimer disease   . Dementia   . SOB (shortness of breath)     Past Surgical History  Procedure Laterality Date  . Cataract extraction, bilateral      Dr Wilkie Aye  . Tonsillectomy    . Colonoscopy  2007    internal hemorrhoids    Social History   Social History  . Marital Status: Single    Spouse Name: N/A  . Number of Children: N/A  . Years of Education: N/A   Occupational History  . Chiropractor at [employer]    Social History Main Topics  . Smoking status: Never Smoker   . Smokeless tobacco: None  . Alcohol Use: No  . Drug Use: No  . Sexual Activity: Not Asked   Other Topics Concern  . None   Social History Narrative    Family History  Problem Relation Age of Onset  . Stroke Father   . Lung cancer Sister     smoker  . Diabetes Neg Hx   . Heart disease Neg Hx     Review of Systems  Constitutional: Negative for fever and chills.  HENT: Positive for hearing loss.   Eyes: Negative for visual disturbance.  Respiratory: Negative for cough, shortness of breath and wheezing.   Cardiovascular: Negative for chest pain, palpitations and  leg swelling.  Gastrointestinal: Negative for nausea, abdominal pain, diarrhea, constipation and blood in stool.       No GERD  Genitourinary: Negative for dysuria and hematuria.  Musculoskeletal: Negative for myalgias, back pain and arthralgias.  Skin: Negative for color change and rash.  Neurological: Negative for dizziness, light-headedness and headaches.  Psychiatric/Behavioral: Negative for dysphoric mood. The patient is not nervous/anxious.        Objective:   Filed Vitals:   01/11/16 1500  BP: 124/82  Pulse: 56  Temp:  98.5 F (36.9 C)  Resp: 18   Filed Weights   01/11/16 1500  Weight: 157 lb (71.215 kg)   Body mass index is 29.68 kg/(m^2).   Physical Exam Constitutional: He appears well-developed and well-nourished. No distress.  HENT:  Head: Normocephalic and atraumatic.  Right Ear: External ear normal.  Left Ear: External ear normal.  Mouth/Throat: Oropharynx is clear and moist.  Normal ear canals and TM b/l  Eyes: Conjunctivae and EOM are normal.  Neck: Neck supple. No tracheal deviation present. No thyromegaly present.  No carotid bruit  Cardiovascular: Normal rate, regular rhythm, normal heart sounds and intact distal pulses.   No murmur heard. Pulmonary/Chest: Effort normal and breath sounds normal. No respiratory distress. He has no wheezes. He has no rales.  Abdominal: Soft. Bowel sounds are normal. He exhibits no distension. There is no tenderness.  Genitourinary:  deferred  Musculoskeletal: He exhibits no edema.  Lymphadenopathy:    He has no cervical adenopathy.  Skin: Skin is warm and dry. He is not diaphoretic.  Psychiatric: He has a normal mood and affect. His behavior is normal.       Assessment & Plan:   Wellness Exam Immunizations-shingles vaccine today. Had influenza vaccine previously. Other immunizations up-to-date EKG - holter done 2015 - no need to repeat EKG today Colonoscopy  Up to date  Eye exam - up to date Hearing loss  - ?  Difficult to determine - will refer to audiology Memory concerns/difficulties - yes, has documented dementia - has been worsening - will defer to his neurologist Independent of ADLs - no, lives in group home  No skin concerns Gets minimal exercise due to shortness of breath and decreased stamina, which is not new Weight-he is overweight, which is likely related to his decreased exercise tolerance  Follow-up annuallySee Problem List for Assessment and Plan of chronic medical problems.

## 2016-01-11 NOTE — Assessment & Plan Note (Signed)
Has worsened per staff at his group home Will defer further evaluation and treatment to his neurologist

## 2016-01-11 NOTE — Assessment & Plan Note (Signed)
Will check A1c

## 2016-01-11 NOTE — Progress Notes (Signed)
Pre visit review using our clinic review tool, if applicable. No additional management support is needed unless otherwise documented below in the visit note. 

## 2016-01-11 NOTE — Assessment & Plan Note (Signed)
Check lipid panel, CMP Continue simvastatin 20 mg daily

## 2016-01-11 NOTE — Assessment & Plan Note (Signed)
Check tsh  Titrate med dose if needed  

## 2016-01-16 ENCOUNTER — Other Ambulatory Visit (INDEPENDENT_AMBULATORY_CARE_PROVIDER_SITE_OTHER): Payer: Medicare Other

## 2016-01-16 DIAGNOSIS — E785 Hyperlipidemia, unspecified: Secondary | ICD-10-CM | POA: Diagnosis not present

## 2016-01-16 DIAGNOSIS — R739 Hyperglycemia, unspecified: Secondary | ICD-10-CM | POA: Diagnosis not present

## 2016-01-16 DIAGNOSIS — E038 Other specified hypothyroidism: Secondary | ICD-10-CM | POA: Diagnosis not present

## 2016-01-16 LAB — LIPID PANEL
Cholesterol: 130 mg/dL (ref 0–200)
HDL: 49.3 mg/dL (ref >39.00)
LDL Cholesterol: 63 mg/dL (ref 0–99)
NonHDL: 80.76
TRIGLYCERIDES: 87 mg/dL (ref 0.0–149.0)
Total CHOL/HDL Ratio: 3
VLDL: 17.4 mg/dL (ref 0.0–40.0)

## 2016-01-16 LAB — COMPREHENSIVE METABOLIC PANEL
ALBUMIN: 3.4 g/dL — AB (ref 3.5–5.2)
ALK PHOS: 52 U/L (ref 39–117)
ALT: 28 U/L (ref 0–53)
AST: 28 U/L (ref 0–37)
BUN: 16 mg/dL (ref 6–23)
CALCIUM: 8.8 mg/dL (ref 8.4–10.5)
CO2: 31 mEq/L (ref 19–32)
Chloride: 105 mEq/L (ref 96–112)
Creatinine, Ser: 0.87 mg/dL (ref 0.40–1.50)
GFR: 94.78 mL/min (ref >60.00)
Glucose, Bld: 93 mg/dL (ref 70–99)
POTASSIUM: 4.1 meq/L (ref 3.5–5.1)
Sodium: 140 mEq/L (ref 135–145)
TOTAL PROTEIN: 6.5 g/dL (ref 6.0–8.3)
Total Bilirubin: 0.4 mg/dL (ref 0.2–1.2)

## 2016-01-16 LAB — CBC WITH DIFFERENTIAL/PLATELET
BASOS ABS: 0.1 10*3/uL (ref 0.0–0.1)
Basophils Relative: 1.3 % (ref 0.0–3.0)
EOS PCT: 1.1 % (ref 0.0–5.0)
Eosinophils Absolute: 0.1 10*3/uL (ref 0.0–0.7)
HEMATOCRIT: 45.7 % (ref 39.0–52.0)
HEMOGLOBIN: 15.1 g/dL (ref 13.0–17.0)
LYMPHS PCT: 21.5 % (ref 12.0–46.0)
Lymphs Abs: 1 10*3/uL (ref 0.7–4.0)
MCHC: 33 g/dL (ref 30.0–36.0)
MCV: 109.7 fl — AB (ref 78.0–100.0)
MONOS PCT: 13.6 % — AB (ref 3.0–12.0)
Monocytes Absolute: 0.6 10*3/uL (ref 0.1–1.0)
Neutro Abs: 2.9 10*3/uL (ref 1.4–7.7)
Neutrophils Relative %: 62.5 % (ref 43.0–77.0)
Platelets: 169 10*3/uL (ref 150.0–400.0)
RBC: 4.16 Mil/uL — AB (ref 4.22–5.81)
RDW: 15.8 % — ABNORMAL HIGH (ref 11.5–15.5)
WBC: 4.7 10*3/uL (ref 4.0–10.5)

## 2016-01-16 LAB — TSH: TSH: 4.12 u[IU]/mL (ref 0.35–4.50)

## 2016-01-16 LAB — HEMOGLOBIN A1C: HEMOGLOBIN A1C: 5.7 % (ref 4.6–6.5)

## 2016-01-18 DIAGNOSIS — G40909 Epilepsy, unspecified, not intractable, without status epilepticus: Secondary | ICD-10-CM | POA: Diagnosis not present

## 2016-01-18 DIAGNOSIS — Q909 Down syndrome, unspecified: Secondary | ICD-10-CM | POA: Diagnosis not present

## 2016-01-18 DIAGNOSIS — R262 Difficulty in walking, not elsewhere classified: Secondary | ICD-10-CM | POA: Diagnosis not present

## 2016-01-18 DIAGNOSIS — R413 Other amnesia: Secondary | ICD-10-CM | POA: Diagnosis not present

## 2016-01-18 DIAGNOSIS — G609 Hereditary and idiopathic neuropathy, unspecified: Secondary | ICD-10-CM | POA: Diagnosis not present

## 2016-01-19 ENCOUNTER — Telehealth: Payer: Self-pay | Admitting: Family

## 2016-01-19 NOTE — Telephone Encounter (Signed)
Please inform patient that his blood work is within the normal limits and comparable to previous blood work. Follow up with Dr. Lawerance Bach as discussed.

## 2016-01-20 NOTE — Telephone Encounter (Signed)
Spoke with caregiver to inform.

## 2016-01-21 ENCOUNTER — Encounter: Payer: Self-pay | Admitting: Internal Medicine

## 2016-03-13 DIAGNOSIS — R620 Delayed milestone in childhood: Secondary | ICD-10-CM | POA: Diagnosis not present

## 2016-03-13 DIAGNOSIS — H6122 Impacted cerumen, left ear: Secondary | ICD-10-CM | POA: Diagnosis not present

## 2016-03-13 DIAGNOSIS — H93293 Other abnormal auditory perceptions, bilateral: Secondary | ICD-10-CM | POA: Diagnosis not present

## 2016-03-13 DIAGNOSIS — Q909 Down syndrome, unspecified: Secondary | ICD-10-CM | POA: Diagnosis not present

## 2016-03-19 ENCOUNTER — Telehealth: Payer: Self-pay | Admitting: Internal Medicine

## 2016-03-19 DIAGNOSIS — H9190 Unspecified hearing loss, unspecified ear: Secondary | ICD-10-CM

## 2016-03-19 DIAGNOSIS — H6123 Impacted cerumen, bilateral: Secondary | ICD-10-CM

## 2016-03-19 NOTE — Telephone Encounter (Signed)
Referral ordered

## 2016-03-19 NOTE — Telephone Encounter (Signed)
Lynden AngCathy called in to advise that the patient's visitto [facility] audiology did not go well. They suggested that he go to an ENT. She is asking for a provider referral to:  dr pincus Address: [id number], [place], [place] [address] Phone: [phone]  Suspected wax buildup

## 2016-03-20 NOTE — Telephone Encounter (Signed)
Spoke with Richard to inform.

## 2016-03-28 DIAGNOSIS — H01004 Unspecified blepharitis left upper eyelid: Secondary | ICD-10-CM | POA: Diagnosis not present

## 2016-03-28 DIAGNOSIS — H01002 Unspecified blepharitis right lower eyelid: Secondary | ICD-10-CM | POA: Diagnosis not present

## 2016-03-28 DIAGNOSIS — H01005 Unspecified blepharitis left lower eyelid: Secondary | ICD-10-CM | POA: Diagnosis not present

## 2016-03-28 DIAGNOSIS — H53031 Strabismic amblyopia, right eye: Secondary | ICD-10-CM | POA: Diagnosis not present

## 2016-03-28 DIAGNOSIS — H401134 Primary open-angle glaucoma, bilateral, indeterminate stage: Secondary | ICD-10-CM | POA: Diagnosis not present

## 2016-03-28 DIAGNOSIS — H01001 Unspecified blepharitis right upper eyelid: Secondary | ICD-10-CM | POA: Diagnosis not present

## 2016-03-28 DIAGNOSIS — H9190 Unspecified hearing loss, unspecified ear: Secondary | ICD-10-CM | POA: Diagnosis not present

## 2016-03-28 DIAGNOSIS — H903 Sensorineural hearing loss, bilateral: Secondary | ICD-10-CM | POA: Diagnosis not present

## 2016-03-28 DIAGNOSIS — H6123 Impacted cerumen, bilateral: Secondary | ICD-10-CM | POA: Diagnosis not present

## 2016-04-25 DIAGNOSIS — H01001 Unspecified blepharitis right upper eyelid: Secondary | ICD-10-CM | POA: Diagnosis not present

## 2016-04-25 DIAGNOSIS — H01005 Unspecified blepharitis left lower eyelid: Secondary | ICD-10-CM | POA: Diagnosis not present

## 2016-04-25 DIAGNOSIS — H53031 Strabismic amblyopia, right eye: Secondary | ICD-10-CM | POA: Diagnosis not present

## 2016-04-25 DIAGNOSIS — H401134 Primary open-angle glaucoma, bilateral, indeterminate stage: Secondary | ICD-10-CM | POA: Diagnosis not present

## 2016-04-25 DIAGNOSIS — H01004 Unspecified blepharitis left upper eyelid: Secondary | ICD-10-CM | POA: Diagnosis not present

## 2016-04-25 DIAGNOSIS — H01002 Unspecified blepharitis right lower eyelid: Secondary | ICD-10-CM | POA: Diagnosis not present

## 2016-05-01 ENCOUNTER — Telehealth: Payer: Self-pay | Admitting: Internal Medicine

## 2016-05-01 NOTE — Telephone Encounter (Signed)
Manger of group home Lynden AngCathy called in and said that they need script for Depends sent to them.  She said that she will come and pick it up.   Please call best number [phone] cathy cell number  Staff/ Gerlene BurdockRichard is [phone]

## 2016-05-02 NOTE — Telephone Encounter (Signed)
Spoke with Gerlene Burdockichard, he will contact Lynden AngCathy to have someone come pick the RX up.

## 2016-05-02 NOTE — Telephone Encounter (Signed)
written

## 2016-05-02 NOTE — Telephone Encounter (Signed)
This will have to be a written RX.

## 2016-05-03 DIAGNOSIS — H01004 Unspecified blepharitis left upper eyelid: Secondary | ICD-10-CM | POA: Diagnosis not present

## 2016-05-03 DIAGNOSIS — H01001 Unspecified blepharitis right upper eyelid: Secondary | ICD-10-CM | POA: Diagnosis not present

## 2016-05-03 DIAGNOSIS — H01002 Unspecified blepharitis right lower eyelid: Secondary | ICD-10-CM | POA: Diagnosis not present

## 2016-05-03 DIAGNOSIS — H01005 Unspecified blepharitis left lower eyelid: Secondary | ICD-10-CM | POA: Diagnosis not present

## 2016-05-15 ENCOUNTER — Emergency Department (HOSPITAL_BASED_OUTPATIENT_CLINIC_OR_DEPARTMENT_OTHER)
Admission: EM | Admit: 2016-05-15 | Discharge: 2016-05-15 | Disposition: A | Discharge status: Other Acute Inpatient Hospital | Payer: Medicare Other | Attending: Emergency Medicine | Admitting: Emergency Medicine

## 2016-05-15 ENCOUNTER — Encounter (HOSPITAL_BASED_OUTPATIENT_CLINIC_OR_DEPARTMENT_OTHER): Payer: Self-pay

## 2016-05-15 ENCOUNTER — Emergency Department (HOSPITAL_BASED_OUTPATIENT_CLINIC_OR_DEPARTMENT_OTHER): Payer: Medicare Other

## 2016-05-15 DIAGNOSIS — W228XXA Striking against or struck by other objects, initial encounter: Secondary | ICD-10-CM | POA: Diagnosis not present

## 2016-05-15 DIAGNOSIS — S0990XA Unspecified injury of head, initial encounter: Secondary | ICD-10-CM

## 2016-05-15 DIAGNOSIS — F0390 Unspecified dementia without behavioral disturbance: Secondary | ICD-10-CM | POA: Diagnosis not present

## 2016-05-15 DIAGNOSIS — Y929 Unspecified place or not applicable: Secondary | ICD-10-CM | POA: Diagnosis not present

## 2016-05-15 DIAGNOSIS — S0511XA Contusion of eyeball and orbital tissues, right eye, initial encounter: Secondary | ICD-10-CM | POA: Diagnosis not present

## 2016-05-15 DIAGNOSIS — E785 Hyperlipidemia, unspecified: Secondary | ICD-10-CM | POA: Diagnosis not present

## 2016-05-15 DIAGNOSIS — Y999 Unspecified external cause status: Secondary | ICD-10-CM | POA: Insufficient documentation

## 2016-05-15 DIAGNOSIS — W19XXXA Unspecified fall, initial encounter: Secondary | ICD-10-CM | POA: Diagnosis not present

## 2016-05-15 DIAGNOSIS — E039 Hypothyroidism, unspecified: Secondary | ICD-10-CM | POA: Insufficient documentation

## 2016-05-15 DIAGNOSIS — Y939 Activity, unspecified: Secondary | ICD-10-CM | POA: Diagnosis not present

## 2016-05-15 DIAGNOSIS — R40241 Glasgow coma scale score 13-15, unspecified time: Secondary | ICD-10-CM | POA: Diagnosis not present

## 2016-05-15 LAB — COMPREHENSIVE METABOLIC PANEL
ALK PHOS: 75 U/L (ref 38–126)
ALT: 31 U/L (ref 17–63)
ANION GAP: 6 (ref 5–15)
AST: 33 U/L (ref 15–41)
Albumin: 3.5 g/dL (ref 3.5–5.0)
BILIRUBIN TOTAL: 0.5 mg/dL (ref 0.3–1.2)
BUN: 17 mg/dL (ref 6–20)
CALCIUM: 8.7 mg/dL — AB (ref 8.9–10.3)
CO2: 28 mmol/L (ref 22–32)
Chloride: 103 mmol/L (ref 101–111)
Creatinine, Ser: 0.9 mg/dL (ref 0.61–1.24)
Glucose, Bld: 95 mg/dL (ref 65–99)
Potassium: 4 mmol/L (ref 3.5–5.1)
Sodium: 137 mmol/L (ref 135–145)
TOTAL PROTEIN: 7 g/dL (ref 6.5–8.1)

## 2016-05-15 LAB — CBC WITH DIFFERENTIAL/PLATELET
Basophils Absolute: 0 10*3/uL (ref 0.0–0.1)
Basophils Relative: 1 %
EOS ABS: 0 10*3/uL (ref 0.0–0.7)
Eosinophils Relative: 1 %
HEMATOCRIT: 42.2 % (ref 39.0–52.0)
HEMOGLOBIN: 14.7 g/dL (ref 13.0–17.0)
LYMPHS ABS: 1 10*3/uL (ref 0.7–4.0)
LYMPHS PCT: 26 %
MCH: 37.1 pg — AB (ref 26.0–34.0)
MCHC: 34.8 g/dL (ref 30.0–36.0)
MCV: 106.6 fL — ABNORMAL HIGH (ref 78.0–100.0)
MONO ABS: 0.7 10*3/uL (ref 0.1–1.0)
Monocytes Relative: 18 %
NEUTROS ABS: 2.1 10*3/uL (ref 1.7–7.7)
Neutrophils Relative %: 54 %
Platelets: 162 10*3/uL (ref 150–400)
RBC: 3.96 MIL/uL — ABNORMAL LOW (ref 4.22–5.81)
RDW: 15.1 % (ref 11.5–15.5)
WBC: 3.8 10*3/uL — ABNORMAL LOW (ref 4.0–10.5)

## 2016-05-15 NOTE — ED Provider Notes (Signed)
CSN: [id number]     Arrival date & time 05/15/16  1514 History   First MD Initiated Contact with Patient 05/15/16 1548     Chief Complaint  Patient presents with  . Head Injury     (Consider location/radiation/quality/duration/timing/severity/associated sxs/prior Treatment) HPI  Went to work this AM, was at baseline, then when group home picked him up from work, he had black eye on the right, seemed to be sleepy,  Work reported they did not know of anything that happened or how black eye occurred. No known falls at the house yesterday either.  Normally will sleep a lot during the day, but today seemed to be more sleepy and given concern for sleepiness and black eye, brought him to hospital. No infectious symptoms.  Not complaining of pain, hx of dementia as well and poor historian.  No cough, no fever.  Level V Caveat, Dementia. History primarily from group home  Past Medical History  Diagnosis Date  . Down's syndrome   . Hyperlipidemia   . Hypothyroidism   . Macrocytosis 2007    MCV 104 w/o anemia  . Alzheimer disease   . Dementia   . SOB (shortness of breath)    Past Surgical History  Procedure Laterality Date  . Cataract extraction, bilateral      Dr Wilkie Ayeigby/ [facility]  . Tonsillectomy    . Colonoscopy  2007    internal hemorrhoids  . Multiple tooth extractions     Family History  Problem Relation Age of Onset  . Stroke Father   . Lung cancer Sister     smoker  . Diabetes Neg Hx   . Heart disease Neg Hx    Social History  Substance Use Topics  . Smoking status: Never Smoker   . Smokeless tobacco: None  . Alcohol Use: No    Review of Systems  Unable to perform ROS: Dementia  Constitutional: Negative for fever.  Respiratory: Negative for cough.   Cardiovascular: Negative for chest pain.  Gastrointestinal: Negative for nausea, vomiting and diarrhea.  Skin: Positive for color change.  Neurological: Negative for headaches.      Allergies  Review of patient's  allergies indicates no known allergies.  Home Medications   Prior to Admission medications   Medication Sig Start Date End Date Taking? Authorizing Provider  aluminum & magnesium hydroxide-simethicone (MYLANTA) 500-450-40 MG/5ML suspension Take by mouth every 4 (four) hours as needed for indigestion.    Historical Provider, MD  aspirin 81 MG tablet Take 81 mg by mouth daily.      Historical Provider, MD  B Complex-C-Folic Acid (VIRT-VITE PLUS PO) Take by mouth daily before breakfast.    Historical Provider, MD  chlorhexidine (PERIDEX) 0.12 % solution Use as directed 15 mLs in the mouth or throat 2 (two) times daily.    Historical Provider, MD  Dextromethorphan-Guaifenesin (ROBITUSSIN DM) 10-100 MG/5ML liquid Take 5 mLs by mouth as needed. For cough    Historical Provider, MD  diphenhydrAMINE (BENADRYL) 12.5 MG/5ML liquid Take 12.5 mg by mouth 3 (three) times daily as needed for allergies.    Historical Provider, MD  divalproex (DEPAKOTE ER) 250 MG 24 hr tablet Take 750 mg by mouth daily. Dr.Milled rx'ed    Historical Provider, MD  donepezil (ARICEPT) 10 MG tablet Take 10 mg by mouth at bedtime. Dr.Miller rx'ed    Historical Provider, MD  Eyelid Cleansers (OCUSOFT LID SCRUB) PADS Apply 1 each topically daily. RX'ed by Dr.Digby    Historical Provider, MD  levothyroxine (SYNTHROID, LEVOTHROID) 50 MCG tablet Take 50 mcg by mouth daily.      Historical Provider, MD  Multiple Vitamin (MULTIVITAMIN) tablet Take 1 tablet by mouth daily.    Historical Provider, MD  Ophthalmic Irrigation Solution (OCUSOFT EYE WASH OP) Apply to eye. At bedtime    Historical Provider, MD  sertraline (ZOLOFT) 50 MG tablet Take 50 mg by mouth daily.    Historical Provider, MD  simvastatin (ZOCOR) 20 MG tablet Take 1 tablet (20 mg total) by mouth at bedtime. 06/28/15   Pecola Lawless, MD   BP 124/85 mmHg  Pulse 68  Temp(Src) 98.2 F (36.8 C) (Oral)  Resp 16  Ht 5\' 1"  (1.549 m)  Wt 142 lb (64.411 kg)  BMI 26.84 kg/m2   SpO2 100% Physical Exam  Constitutional: He appears well-developed and well-nourished. No distress.  HENT:  Head: Normocephalic. Head is with contusion (right periorbital).  Eyes: Conjunctivae and EOM are normal.  Neck: Normal range of motion. No spinous process tenderness present. No rigidity.  Cardiovascular: Normal rate, regular rhythm, normal heart sounds and intact distal pulses.  Exam reveals no gallop and no friction rub.   No murmur heard. Pulmonary/Chest: Effort normal and breath sounds normal. No respiratory distress. He has no wheezes. He has no rales.  Abdominal: Soft. He exhibits no distension. There is no tenderness. There is no guarding.  Musculoskeletal: He exhibits no edema.  Neurological: He is alert. He has normal strength. No cranial nerve deficit. GCS eye subscore is 4. GCS verbal subscore is 5. GCS motor subscore is 6.  Skin: Skin is warm and dry. He is not diaphoretic.  Nursing note and vitals reviewed.   ED Course  Procedures (including critical care time) Labs Review Labs Reviewed  CBC WITH DIFFERENTIAL/PLATELET - Abnormal; Notable for the following:    WBC 3.8 (*)    RBC 3.96 (*)    MCV 106.6 (*)    MCH 37.1 (*)    All other components within normal limits  COMPREHENSIVE METABOLIC PANEL - Abnormal; Notable for the following:    Calcium 8.7 (*)    All other components within normal limits    Imaging Review Ct Head Wo Contrast  05/15/2016  CLINICAL DATA:  Fall with bruising around the right eye. EXAM: CT HEAD WITHOUT CONTRAST CT MAXILLOFACIAL WITHOUT CONTRAST TECHNIQUE: Multidetector CT imaging of the head and maxillofacial structures were performed using the standard protocol without intravenous contrast. Multiplanar CT image reconstructions of the maxillofacial structures were also generated. COMPARISON:  None. FINDINGS: CT HEAD FINDINGS Skull and Sinuses:Negative for calvarial fracture. Facial findings described below. Brain: No acute finding. No evidence  of acute infarction, hemorrhage, hydrocephalus, or mass lesion/mass effect. Advanced atrophy with prominent mesial temporal volume loss in this patient with history of Alzheimer's disease. Ventriculomegaly is attributed to ex vacuo dilatation. Remote left larger than right parietal cortically based infarct. Senescent/dystrophic calcifications in the globus pallidus and deep cerebellum. CT MAXILLOFACIAL FINDINGS Soft tissue swelling above the right orbit. No associated fracture. No evidence of acute globe or other postseptal injury. The patient is status post bilateral cataract resection. Increased AP dimension of the globes. Rightward translation of the mandible is considered positional. No associated soft tissue swelling. IMPRESSION: 1. No evidence of acute intracranial injury or facial fracture. 2. Advanced atrophy correlating with dementia history. Electronically Signed   By: Marnee Spring M.D.   On: 05/15/2016 16:57   Ct Maxillofacial Wo Cm  05/15/2016  CLINICAL DATA:  Fall with  bruising around the right eye. EXAM: CT HEAD WITHOUT CONTRAST CT MAXILLOFACIAL WITHOUT CONTRAST TECHNIQUE: Multidetector CT imaging of the head and maxillofacial structures were performed using the standard protocol without intravenous contrast. Multiplanar CT image reconstructions of the maxillofacial structures were also generated. COMPARISON:  None. FINDINGS: CT HEAD FINDINGS Skull and Sinuses:Negative for calvarial fracture. Facial findings described below. Brain: No acute finding. No evidence of acute infarction, hemorrhage, hydrocephalus, or mass lesion/mass effect. Advanced atrophy with prominent mesial temporal volume loss in this patient with history of Alzheimer's disease. Ventriculomegaly is attributed to ex vacuo dilatation. Remote left larger than right parietal cortically based infarct. Senescent/dystrophic calcifications in the globus pallidus and deep cerebellum. CT MAXILLOFACIAL FINDINGS Soft tissue swelling above  the right orbit. No associated fracture. No evidence of acute globe or other postseptal injury. The patient is status post bilateral cataract resection. Increased AP dimension of the globes. Rightward translation of the mandible is considered positional. No associated soft tissue swelling. IMPRESSION: 1. No evidence of acute intracranial injury or facial fracture. 2. Advanced atrophy correlating with dementia history. Electronically Signed   By: Marnee Spring M.D.   On: 05/15/2016 16:57   I have personally reviewed and evaluated these images and lab results as part of my medical decision-making.   EKG Interpretation   Date/Time:  Tuesday May 15 2016 16:04:56 EDT Ventricular Rate:  57 PR Interval:  155 QRS Duration: 97 QT Interval:  462 QTC Calculation: 450 R Axis:   -20 Text Interpretation:  Sinus rhythm Borderline left axis deviation  Borderline T abnormalities, inferior leads No significant change since  last tracing Confirmed by [facility] MD, Taino Maertens ([id number]) on 05/16/2016 2:11:38  AM      MDM   Final diagnoses:  Head injury, initial encounter  Periorbital contusion, right, initial encounter   60 year old male with a history of Down syndrome, hyperlipidemia, hypothyroidism, dementia presents from group home with concern for contusion over his right eye, and mildly increased sleepiness from baseline. [employer] employee is concerned, as patient did not have this contusion prior to going to work today, and it is unclear if patient had a fall or other trauma. Reports the patient typically does sleep a lot during the day, however. More sleepy today, and given signs of head trauma, he brought him in for evaluation. Patient is alert and my exam. There is no history to suggest infection, no tachycardia, no fever, and have low suspicion for this is etiology of symptoms. Labs show no sign of anemia. CT of head maxillofacial show no acute abnormalities. EKG was performed which showed no acute ST  changes.  Recommend continued close outpatient follow up, return for new or worsening symptoms. Patient discharged in stable condition with understanding of reasons to return.    Alvira Monday, MD 05/16/16 [phone]

## 2016-05-15 NOTE — ED Notes (Signed)
Pt placed on auto vitals Q30. Patient placed on cardiac monitor.  

## 2016-05-15 NOTE — ED Notes (Addendum)
Caregiver/manager of group home, Cathleen CortiCathy Herrera states pt went to work at [employer] today-returned with bruising above right eye-pt stated "he collapsed"-she feels pt meant he fell-she was unable to obtain details of events from any one at [employer] through phone-states she does not feel any type of abuse or assault-pt NAD and at baseline-pt ambulated to triage with pt on her arm assist -bruising noted to right upper eye lid/brow-no break in skin noted

## 2016-05-15 NOTE — Discharge Instructions (Signed)

## 2016-05-16 DIAGNOSIS — Q909 Down syndrome, unspecified: Secondary | ICD-10-CM | POA: Diagnosis not present

## 2016-05-16 DIAGNOSIS — F0281 Dementia in other diseases classified elsewhere with behavioral disturbance: Secondary | ICD-10-CM | POA: Diagnosis not present

## 2016-05-16 DIAGNOSIS — R262 Difficulty in walking, not elsewhere classified: Secondary | ICD-10-CM | POA: Diagnosis not present

## 2016-05-16 DIAGNOSIS — G609 Hereditary and idiopathic neuropathy, unspecified: Secondary | ICD-10-CM | POA: Diagnosis not present

## 2016-05-16 DIAGNOSIS — R413 Other amnesia: Secondary | ICD-10-CM | POA: Diagnosis not present

## 2016-05-16 DIAGNOSIS — G40909 Epilepsy, unspecified, not intractable, without status epilepticus: Secondary | ICD-10-CM | POA: Diagnosis not present

## 2016-05-28 ENCOUNTER — Ambulatory Visit (INDEPENDENT_AMBULATORY_CARE_PROVIDER_SITE_OTHER): Payer: Medicare Other | Admitting: Internal Medicine

## 2016-05-28 ENCOUNTER — Encounter: Payer: Self-pay | Admitting: Internal Medicine

## 2016-05-28 VITALS — BP 114/86 | HR 48 | Temp 97.5°F | Resp 16 | Wt 149.0 lb

## 2016-05-28 DIAGNOSIS — S0990XA Unspecified injury of head, initial encounter: Secondary | ICD-10-CM | POA: Insufficient documentation

## 2016-05-28 DIAGNOSIS — Z111 Encounter for screening for respiratory tuberculosis: Secondary | ICD-10-CM | POA: Diagnosis not present

## 2016-05-28 DIAGNOSIS — S0990XD Unspecified injury of head, subsequent encounter: Secondary | ICD-10-CM | POA: Diagnosis not present

## 2016-05-28 NOTE — Patient Instructions (Addendum)
   A test for TB was done today - return in two days to have this checked.  Medications reviewed and updated.  No changes recommended at this time.

## 2016-05-28 NOTE — Assessment & Plan Note (Signed)
Ct of face and head negative for acute issues Residual bruising right orbit, no other injuries No further evaluation Fall precautions

## 2016-05-28 NOTE — Progress Notes (Signed)
Subjective:    Patient ID: Travis Sampson, male    DOB: 02/16/1956, 60 y.o.   MRN: [id number]  HPI He is here for an acute visit for follow up of his recent fall.  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  No one was there to witness the fall and he has dementia and specifics of the fall are not known.  He hit his head and did have some bruising around his right eye.  He denied other injuries.  He was seen in the ED   He had a ct scan of his head and face and they were normal.  He has some residual bruising in the lower right orbit now.   He did not complain of headaches, vision changes.  Some days his walking is good, other days it is bad.  He had two falls in the past several months - one at home - went down to his knees.  He usually has someone walking with him and holds his arm.  He leans forward and scuffles his feet.    He will moving to a memory care unit. He does wander and he needs to move there for safety.    Medications and allergies reviewed with patient and updated if appropriate.  Patient Active Problem List   Diagnosis Date Noted  . Raynaud's phenomenon 08/31/2014  . DOE (dyspnea on exertion) 07/30/2014  . Bradycardia 07/30/2014  . Macrocytosis without anemia 01/03/2014  . Syncope 07/14/2013  . Peripheral neuropathy (HCC) 07/14/2013  . Memory loss 09/29/2012  . Abnormal ECG 10/16/2011  . Hyperglycemia 07/01/2009  . Hypothyroidism 06/16/2007  . Hyperlipidemia 06/16/2007  . DOWN SYNDROME 06/16/2007    Current Outpatient Prescriptions on File Prior to Visit  Medication Sig Dispense Refill  . aluminum & magnesium hydroxide-simethicone (MYLANTA) 500-450-40 MG/5ML suspension Take by mouth every 4 (four) hours as needed for indigestion.    Marland Kitchen. aspirin 81 MG tablet Take 81 mg by mouth daily.      . B Complex-C-Folic Acid (VIRT-VITE PLUS PO) Take by mouth daily before breakfast.    . chlorhexidine (PERIDEX)  0.12 % solution Use as directed 15 mLs in the mouth or throat 2 (two) times daily.    Marland Kitchen. Dextromethorphan-Guaifenesin (ROBITUSSIN DM) 10-100 MG/5ML liquid Take 5 mLs by mouth as needed. For cough    . diphenhydrAMINE (BENADRYL) 12.5 MG/5ML liquid Take 12.5 mg by mouth 3 (three) times daily as needed for allergies.    Marland Kitchen. divalproex (DEPAKOTE ER) 250 MG 24 hr tablet Take 750 mg by mouth daily. Dr.Milled rx'ed    . donepezil (ARICEPT) 10 MG tablet Take 10 mg by mouth at bedtime. Dr.Miller rx'ed    . Eyelid Cleansers (OCUSOFT LID SCRUB) PADS Apply 1 each topically daily. RX'ed by Dr.Digby    . levothyroxine (SYNTHROID, LEVOTHROID) 50 MCG tablet Take 50 mcg by mouth daily.      . Multiple Vitamin (MULTIVITAMIN) tablet Take 1 tablet by mouth daily.    Marland Kitchen. Ophthalmic Irrigation Solution (OCUSOFT EYE WASH OP) Apply to eye. At bedtime    . sertraline (ZOLOFT) 50 MG tablet Take 50 mg by mouth daily.    . simvastatin (ZOCOR) 20 MG tablet Take 1 tablet (20 mg total) by mouth at bedtime. 90 tablet 1   No current facility-administered medications on file prior to visit.    Past Medical History  Diagnosis Date  .  Down's syndrome   . Hyperlipidemia   . Hypothyroidism   . Macrocytosis 2007    MCV 104 w/o anemia  . Alzheimer disease   . Dementia   . SOB (shortness of breath)     Past Surgical History  Procedure Laterality Date  . Cataract extraction, bilateral      Dr Wilkie Ayeigby/ [facility]  . Tonsillectomy    . Colonoscopy  2007    internal hemorrhoids  . Multiple tooth extractions      Social History   Social History  . Marital Status: Single    Spouse Name: N/A  . Number of Children: N/A  . Years of Education: N/A   Occupational History  . 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 Smoking status: Never Smoker   . Smokeless tobacco: Not on file  . Alcohol Use: No  . Drug Use: No  . Sexual Activity: Not on file   Other Topics Concern  . Not on file   Social  History Narrative    Family History  Problem Relation Age of Onset  . Stroke Father   . Lung cancer Sister     smoker  . Diabetes Neg Hx   . Heart disease Neg Hx     Review of Systems  Constitutional: Negative for fever.  Respiratory: Negative for cough, shortness of breath and wheezing.   Cardiovascular: Negative for chest pain, palpitations and leg swelling.  Gastrointestinal: Negative for nausea and abdominal pain.  Neurological: Negative for light-headedness and headaches.       Objective:   Filed Vitals:   05/28/16 1101  BP: 114/86  Pulse: 48  Temp: 97.5 F (36.4 C)  Resp: 16   Filed Weights   05/28/16 1101  Weight: 149 lb (67.586 kg)   Body mass index is 28.17 kg/(m^2).   Physical Exam  Constitutional: He appears well-developed and well-nourished. No distress.  Cardiovascular: Normal rate, regular rhythm and normal heart sounds.   No murmur heard. Pulmonary/Chest: Effort normal and breath sounds normal. No respiratory distress. He has no wheezes. He has no rales.  Abdominal: Soft. He exhibits no distension. There is no tenderness. There is no rebound and no guarding.  Musculoskeletal: He exhibits edema (mild b/l LE with chronic skin changes).  Skin: He is not diaphoretic.  Mild bruising right lower orbit, no other bruising          Assessment & Plan:   See Problem List for Assessment and Plan of chronic medical problems.

## 2016-05-28 NOTE — Progress Notes (Signed)
Pre visit review using our clinic review tool, if applicable. No additional management support is needed unless otherwise documented below in the visit note. 

## 2016-05-30 ENCOUNTER — Ambulatory Visit: Payer: Self-pay

## 2016-05-30 LAB — TB SKIN TEST
INDURATION: 0 mm
TB SKIN TEST: NEGATIVE

## 2016-06-06 ENCOUNTER — Encounter: Payer: Self-pay | Admitting: Gastroenterology

## 2016-06-21 ENCOUNTER — Telehealth: Payer: Self-pay | Admitting: Internal Medicine

## 2016-06-21 DIAGNOSIS — Q909 Down syndrome, unspecified: Secondary | ICD-10-CM

## 2016-06-21 DIAGNOSIS — H9190 Unspecified hearing loss, unspecified ear: Secondary | ICD-10-CM

## 2016-06-21 NOTE — Telephone Encounter (Signed)
Call where he is living.  [facility] recommended further testing of is hearing - I have placed an order for [facility] where this test is done

## 2016-06-22 NOTE — Telephone Encounter (Signed)
Spoke with Gerlene Burdockichard, caregiver, he will give the message to [place].

## 2016-07-04 DIAGNOSIS — H00015 Hordeolum externum left lower eyelid: Secondary | ICD-10-CM | POA: Diagnosis not present

## 2016-07-04 DIAGNOSIS — H00025 Hordeolum internum left lower eyelid: Secondary | ICD-10-CM | POA: Diagnosis not present

## 2016-07-04 DIAGNOSIS — H00014 Hordeolum externum left upper eyelid: Secondary | ICD-10-CM | POA: Diagnosis not present

## 2016-07-06 ENCOUNTER — Telehealth: Payer: Self-pay

## 2016-07-06 NOTE — Telephone Encounter (Signed)
Received fax from [facility]. They are requesting Dilantin levels to be ordered.   Per written okay from PCP to order labs to be drawn here in house.   Sent fax back to [facility] requesting the MD rxing Dilantin. Medication not on list and no dx to associate to the order indicated in pt problem list.    Waiting their response before completing order.

## 2016-07-08 NOTE — Telephone Encounter (Signed)
?   Referring to Depakote  - this is prescribed by his neurologist - lets have him order levels  Thanks.

## 2016-07-10 DIAGNOSIS — H01001 Unspecified blepharitis right upper eyelid: Secondary | ICD-10-CM | POA: Diagnosis not present

## 2016-07-10 DIAGNOSIS — H01004 Unspecified blepharitis left upper eyelid: Secondary | ICD-10-CM | POA: Diagnosis not present

## 2016-07-10 DIAGNOSIS — H53031 Strabismic amblyopia, right eye: Secondary | ICD-10-CM | POA: Diagnosis not present

## 2016-07-10 DIAGNOSIS — H00015 Hordeolum externum left lower eyelid: Secondary | ICD-10-CM | POA: Diagnosis not present

## 2016-07-10 DIAGNOSIS — H401134 Primary open-angle glaucoma, bilateral, indeterminate stage: Secondary | ICD-10-CM | POA: Diagnosis not present

## 2016-07-11 NOTE — Telephone Encounter (Signed)
[facility] called about this again.  I informed of notes. [phone] Travis Sampson- Carmella , Please call her once Dr. Lawerance BachBurns is in the office and the labs can be done.

## 2016-07-11 NOTE — Telephone Encounter (Signed)
Facility is aware that Dr Lawerance BachBurns is out of the office until 8/14. Will send orders when she returns.

## 2016-07-12 NOTE — Telephone Encounter (Signed)
Pt was transferred from a group home and was already on Dilantin. Does she still need to fax back a form? Please follow up thanks.

## 2016-07-13 NOTE — Telephone Encounter (Signed)
Spoke to Clioarmella with [facility]. Informed that PCP has not rx'ed the Dilantin and that our records do not show it as a medication that he is on.   Carmella reviewed care providers with RN on duty and they stated that pt is seeing Neurologist Dr. Hyacinth MeekerMiller. They also informed that pt has an appt with Dr. Hyacinth MeekerMiller coming up.   Reviewed media tab and Med List from Group Home does not have Dilantin on his medication list.

## 2016-07-17 DIAGNOSIS — L853 Xerosis cutis: Secondary | ICD-10-CM | POA: Diagnosis not present

## 2016-07-17 DIAGNOSIS — Q845 Enlarged and hypertrophic nails: Secondary | ICD-10-CM | POA: Diagnosis not present

## 2016-07-17 DIAGNOSIS — M2010 Hallux valgus (acquired), unspecified foot: Secondary | ICD-10-CM | POA: Diagnosis not present

## 2016-07-17 DIAGNOSIS — L603 Nail dystrophy: Secondary | ICD-10-CM | POA: Diagnosis not present

## 2016-07-17 DIAGNOSIS — B351 Tinea unguium: Secondary | ICD-10-CM | POA: Diagnosis not present

## 2016-07-18 ENCOUNTER — Telehealth: Payer: Self-pay | Admitting: Internal Medicine

## 2016-07-18 NOTE — Telephone Encounter (Signed)
Error

## 2016-07-18 NOTE — Telephone Encounter (Signed)
Spoke with nurse at [facility]. Informed that pt does not take Dilantin, therefore would not need labs to check the levels. They are now asking if pt should come for labs to check Depakote levels? Please advise.

## 2016-07-18 NOTE — Telephone Encounter (Signed)
This is prescribed by neurology-Dr. Hyacinth MeekerMiller and they should be the one to monitor

## 2016-07-19 NOTE — Telephone Encounter (Signed)
LVM with Carmella to inform.

## 2016-08-22 DIAGNOSIS — G609 Hereditary and idiopathic neuropathy, unspecified: Secondary | ICD-10-CM | POA: Diagnosis not present

## 2016-08-22 DIAGNOSIS — Z9181 History of falling: Secondary | ICD-10-CM | POA: Diagnosis not present

## 2016-08-22 DIAGNOSIS — Q909 Down syndrome, unspecified: Secondary | ICD-10-CM | POA: Diagnosis not present

## 2016-08-22 DIAGNOSIS — F0281 Dementia in other diseases classified elsewhere with behavioral disturbance: Secondary | ICD-10-CM | POA: Diagnosis not present

## 2016-08-22 DIAGNOSIS — R413 Other amnesia: Secondary | ICD-10-CM | POA: Diagnosis not present

## 2016-08-27 ENCOUNTER — Telehealth: Payer: Self-pay | Admitting: Emergency Medicine

## 2016-08-27 MED ORDER — ATORVASTATIN CALCIUM 10 MG PO TABS: 10.0000 mg | ORAL_TABLET | Freq: Every day | ORAL | 3 refills | Status: DC

## 2016-08-27 NOTE — Telephone Encounter (Signed)
Ok - lipitor rx sent to [facility]

## 2016-08-27 NOTE — Telephone Encounter (Signed)
[facility], they are requesting to switch pt from Simvastatin to atorvastatin. Please advise? Do not see where this is a request due to price.

## 2016-09-03 ENCOUNTER — Telehealth: Payer: Self-pay | Admitting: Emergency Medicine

## 2016-09-03 NOTE — Telephone Encounter (Signed)
Spoke with [facility], Manager, about a faxed that was received on 9/25 with the pts BP and O2 sat being much lower than normal. States that the pt was asmyptomatic and was "fine". They did not contact our office/ triage line/ or 911. States pt saw Neuro 2 weeks ago. States they do not have a contact number for our office. Number was given to contact us regarding pt and also given on cover sheet sent with fax.   Dr Lawerance BachBurns has been notified and would like pt to be seen this week.

## 2016-09-05 NOTE — Telephone Encounter (Signed)
Will you cancel this order in your basket please?

## 2016-09-06 NOTE — Telephone Encounter (Signed)
Herbert SetaHeather, called back needing forms signed and sent back that were faxed over on 9/27.  Can reach back at [phone].  Fax number is [phone]

## 2016-09-07 NOTE — Telephone Encounter (Signed)
Orders have been received and faxed back

## 2016-09-18 DIAGNOSIS — Z23 Encounter for immunization: Secondary | ICD-10-CM | POA: Diagnosis not present

## 2016-10-17 ENCOUNTER — Telehealth: Payer: Self-pay | Admitting: Emergency Medicine

## 2016-10-17 NOTE — Telephone Encounter (Signed)
[facility] called and wants to know if they can get patients last office visit notes faxed over to them. E-Fax number is [phone]. Please advise thanks.

## 2016-10-18 NOTE — Telephone Encounter (Signed)
OV notes faxed to number listed.

## 2016-10-24 ENCOUNTER — Other Ambulatory Visit: Payer: Self-pay | Admitting: Emergency Medicine

## 2016-10-24 MED ORDER — ATORVASTATIN CALCIUM 10 MG PO TABS: 10.0000 mg | ORAL_TABLET | Freq: Every day | ORAL | 1 refills | Status: DC

## 2016-11-07 ENCOUNTER — Ambulatory Visit: Payer: Self-pay | Admitting: Internal Medicine

## 2016-11-14 ENCOUNTER — Ambulatory Visit: Payer: Self-pay | Admitting: Internal Medicine

## 2016-11-15 ENCOUNTER — Telehealth: Payer: Self-pay | Admitting: Internal Medicine

## 2016-11-15 NOTE — Telephone Encounter (Signed)
Spoke with Huntley DecSara to make her aware that pt was not seen yesterday. Transportation brought pt to his appt 30 mins late and we were unable to see him. She will have transportation call back to schedule an appt for a face to face for home health.

## 2016-11-15 NOTE — Telephone Encounter (Signed)
Patient was seen yesterday and Travis Sampson has not received forms back.  Also looking for order for PT.  Please follow up in regard.

## 2016-12-04 ENCOUNTER — Ambulatory Visit (INDEPENDENT_AMBULATORY_CARE_PROVIDER_SITE_OTHER): Payer: Medicare Other | Admitting: Internal Medicine

## 2016-12-04 ENCOUNTER — Encounter: Payer: Self-pay | Admitting: Internal Medicine

## 2016-12-04 VITALS — BP 150/104 | Temp 97.9°F | Resp 16 | Ht 61.0 in | Wt 159.0 lb

## 2016-12-04 DIAGNOSIS — R600 Localized edema: Secondary | ICD-10-CM | POA: Diagnosis not present

## 2016-12-04 DIAGNOSIS — I7300 Raynaud's syndrome without gangrene: Secondary | ICD-10-CM

## 2016-12-04 DIAGNOSIS — R2681 Unsteadiness on feet: Secondary | ICD-10-CM | POA: Insufficient documentation

## 2016-12-04 MED ORDER — HYDROCHLOROTHIAZIDE 12.5 MG PO TABS: 12.5000 mg | ORAL_TABLET | Freq: Every day | ORAL | 3 refills | Status: DC

## 2016-12-04 NOTE — Progress Notes (Signed)
Pre visit review using our clinic review tool, if applicable. No additional management support is needed unless otherwise documented below in the visit note. 

## 2016-12-04 NOTE — Assessment & Plan Note (Signed)
2 + pitting edema b/l legs This is new No SOB, chest pain, lightheadedness or other concerning symptoms Is sedentary Will start hctz 12.5 mg daily Follow up in 2 months, sooner if needed Will do blood work at next visit

## 2016-12-04 NOTE — Assessment & Plan Note (Signed)
cause of his bluish discoloration in hands Has been evaluated by cardiology two years ago - no signs of significant cardiovascular disease Symptomatic treatment at this time Follow up if symptoms worsen

## 2016-12-04 NOTE — Patient Instructions (Addendum)
   Medications reviewed and updated.  Changes include starting hctz 12. 5 mg for leg edema.  Your prescription(s) have been submitted to your pharmacy. Please take as directed and contact our office if you believe you are having problem(s) with the medication(s).  A referral was ordered for PT  Please followup in 2 months

## 2016-12-04 NOTE — Assessment & Plan Note (Signed)
Unsteady, poor gait, high risk of falls Will order PT [facility]

## 2016-12-04 NOTE — Progress Notes (Signed)
Subjective:    Patient ID: Travis Sampson, male    DOB: 04/05/1956, 61 y.o.   MRN: [id number]  HPI He is here for follow up.  He is here with an aid from his home.  Unsteady, shuffling gait, decreased balance:  He is very unstable on his feet.  He has not fallen per the aid from his home.  They would like to start PT to help improve his gait.  He is very sedentary.   Cold, blue hands:  Recently it was noticed his hands were cold and bluish in color.  It is difficulty to get a pulse ox.  Leg swelling:  He is sedentary.  He has bilateral leg swelling.  He is an unreliable historian. The aid does not feel he has complained of any discomfort.  He denies SOB, chest pain and lightheadedness.   Medications and allergies reviewed with patient and updated if appropriate.  Patient Active Problem List   Diagnosis Date Noted  . Head injury 05/28/2016  . Raynaud's phenomenon 08/31/2014  . DOE (dyspnea on exertion) 07/30/2014  . Bradycardia 07/30/2014  . Macrocytosis without anemia 01/03/2014  . Syncope 07/14/2013  . Peripheral neuropathy (HCC) 07/14/2013  . Memory loss 09/29/2012  . Abnormal ECG 10/16/2011  . Hyperglycemia 07/01/2009  . Hypothyroidism 06/16/2007  . Hyperlipidemia 06/16/2007  . DOWN SYNDROME 06/16/2007    Current Outpatient Prescriptions on File Prior to Visit  Medication Sig Dispense Refill  . aluminum & magnesium hydroxide-simethicone (MYLANTA) 500-450-40 MG/5ML suspension Take by mouth every 4 (four) hours as needed for indigestion.    Marland Kitchen. aspirin 81 MG tablet Take 81 mg by mouth daily.      Marland Kitchen. atorvastatin (LIPITOR) 10 MG tablet Take 1 tablet (10 mg total) by mouth daily. 90 tablet 1  . B Complex-C-Folic Acid (VIRT-VITE PLUS PO) Take by mouth daily before breakfast.    . chlorhexidine (PERIDEX) 0.12 % solution Use as directed 15 mLs in the mouth or throat 2 (two) times daily.    Marland Kitchen. Dextromethorphan-Guaifenesin (ROBITUSSIN DM) 10-100 MG/5ML liquid Take 5 mLs by mouth  as needed. For cough    . diphenhydrAMINE (BENADRYL) 12.5 MG/5ML liquid Take 12.5 mg by mouth 3 (three) times daily as needed for allergies.    Marland Kitchen. divalproex (DEPAKOTE ER) 250 MG 24 hr tablet Take 750 mg by mouth daily. Dr.Milled rx'ed    . donepezil (ARICEPT) 10 MG tablet Take 10 mg by mouth at bedtime. Dr.Miller rx'ed    . Eyelid Cleansers (OCUSOFT LID SCRUB) PADS Apply 1 each topically daily. RX'ed by Dr.Digby    . levothyroxine (SYNTHROID, LEVOTHROID) 50 MCG tablet Take 50 mcg by mouth daily.      . Multiple Vitamin (MULTIVITAMIN) tablet Take 1 tablet by mouth daily.    Marland Kitchen. Ophthalmic Irrigation Solution (OCUSOFT EYE WASH OP) Apply to eye. At bedtime    . QUEtiapine (SEROQUEL) 25 MG tablet Take 25 mg by mouth at bedtime.    . sertraline (ZOLOFT) 50 MG tablet Take 50 mg by mouth daily.     No current facility-administered medications on file prior to visit.     Past Medical History:  Diagnosis Date  . Alzheimer disease   . Dementia   . Down's syndrome   . Hyperlipidemia   . Hypothyroidism   . Macrocytosis 2007   MCV 104 w/o anemia  . SOB (shortness of breath)     Past Surgical History:  Procedure Laterality Date  . CATARACT EXTRACTION, BILATERAL  Dr Wilkie Aye  . COLONOSCOPY  2007   internal hemorrhoids  . MULTIPLE TOOTH EXTRACTIONS    . TONSILLECTOMY      Social History   Social History  . Marital status: Single    Spouse name: N/A  . Number of children: N/A  . Years of education: N/A   Occupational History  . Chiropractor at [employer]    Social History Main Topics  . Smoking status: Never Smoker  . Smokeless tobacco: Not on file  . Alcohol use No  . Drug use: No  . Sexual activity: Not on file   Other Topics Concern  . Not on file   Social History Narrative  . No narrative on file    Family History  Problem Relation Age of Onset  . Stroke Father   . Lung cancer Sister     smoker  . Diabetes Neg Hx   . Heart disease Neg Hx      Review of Systems  Constitutional: Negative for fever.  Respiratory: Negative for cough, shortness of breath and wheezing.   Cardiovascular: Positive for leg swelling. Negative for chest pain and palpitations.  Musculoskeletal: Positive for gait problem (unsteady, shuffling gait).  Neurological: Negative for light-headedness and headaches.       Objective:   Vitals:   12/04/16 1554  BP: (!) 150/104  Resp: 16  Temp: 97.9 F (36.6 C)   Filed Weights   12/04/16 1554  Weight: 159 lb (72.1 kg)   Body mass index is 30.04 kg/m.  Wt Readings from Last 3 Encounters:  12/04/16 159 lb (72.1 kg)  05/28/16 149 lb (67.6 kg)  05/15/16 142 lb (64.4 kg)     Physical Exam  Constitutional: He appears well-developed and well-nourished. No distress.  HENT:  Head: Normocephalic and atraumatic.  Eyes: Conjunctivae are normal.  Neck: Neck supple. No tracheal deviation present. No thyromegaly present.  Cardiovascular: Normal rate, regular rhythm and normal heart sounds.   No murmur heard. Radial pulse 1+ b/l  Pulmonary/Chest: Effort normal and breath sounds normal. No respiratory distress. He has no wheezes. He has no rales.  Abdominal: Soft. He exhibits no distension. There is no tenderness. There is no rebound.  Musculoskeletal: He exhibits edema (2 + pitting edema b/l LE).  Lymphadenopathy:    He has no cervical adenopathy.  Neurological:  In wheelchair unsteady with standing  Skin: Skin is warm and dry. He is not diaphoretic.  Bluish discoloration b/l fingers          Assessment & Plan:   See Problem List for Assessment and Plan of chronic medical problems.

## 2016-12-07 DIAGNOSIS — Q909 Down syndrome, unspecified: Secondary | ICD-10-CM | POA: Diagnosis not present

## 2016-12-07 DIAGNOSIS — R2681 Unsteadiness on feet: Secondary | ICD-10-CM | POA: Diagnosis not present

## 2016-12-07 DIAGNOSIS — Z7982 Long term (current) use of aspirin: Secondary | ICD-10-CM | POA: Diagnosis not present

## 2016-12-07 DIAGNOSIS — Z87820 Personal history of traumatic brain injury: Secondary | ICD-10-CM | POA: Diagnosis not present

## 2016-12-07 DIAGNOSIS — G629 Polyneuropathy, unspecified: Secondary | ICD-10-CM | POA: Diagnosis not present

## 2016-12-07 DIAGNOSIS — R600 Localized edema: Secondary | ICD-10-CM | POA: Diagnosis not present

## 2016-12-07 DIAGNOSIS — G309 Alzheimer's disease, unspecified: Secondary | ICD-10-CM | POA: Diagnosis not present

## 2016-12-07 DIAGNOSIS — Z9181 History of falling: Secondary | ICD-10-CM | POA: Diagnosis not present

## 2016-12-07 DIAGNOSIS — R030 Elevated blood-pressure reading, without diagnosis of hypertension: Secondary | ICD-10-CM | POA: Diagnosis not present

## 2016-12-07 DIAGNOSIS — F0280 Dementia in other diseases classified elsewhere without behavioral disturbance: Secondary | ICD-10-CM | POA: Diagnosis not present

## 2016-12-10 ENCOUNTER — Telehealth: Payer: Self-pay | Admitting: Emergency Medicine

## 2016-12-10 NOTE — Telephone Encounter (Signed)
Received call from [facility] for pt's PT to continue 1 week 1, 2xs a week for 5 weeks. Per MD, verbal orders were given.

## 2017-01-10 ENCOUNTER — Telehealth: Payer: Self-pay | Admitting: Internal Medicine

## 2017-01-10 NOTE — Telephone Encounter (Signed)
Requesting to extend PT visit by one week for one visit due to snow we last had.

## 2017-01-10 NOTE — Telephone Encounter (Signed)
Spoke with Eunice BlaseDebbie to give verbal orders.

## 2017-01-11 DIAGNOSIS — H01001 Unspecified blepharitis right upper eyelid: Secondary | ICD-10-CM | POA: Diagnosis not present

## 2017-01-11 DIAGNOSIS — H01004 Unspecified blepharitis left upper eyelid: Secondary | ICD-10-CM | POA: Diagnosis not present

## 2017-01-11 DIAGNOSIS — H01005 Unspecified blepharitis left lower eyelid: Secondary | ICD-10-CM | POA: Diagnosis not present

## 2017-01-11 DIAGNOSIS — H01002 Unspecified blepharitis right lower eyelid: Secondary | ICD-10-CM | POA: Diagnosis not present

## 2017-01-14 ENCOUNTER — Telehealth: Payer: Self-pay | Admitting: *Deleted

## 2017-01-14 NOTE — Telephone Encounter (Signed)
Left msg on triage stating needing to speak w/nurse concerning pt medications. He has 4 meds that are no longer covered by insurance plan. Called pt back was told that Heather & angie has already left for today. Will have them to RTC tomorrow...Travis Sampson/lmb

## 2017-01-15 NOTE — Telephone Encounter (Signed)
Called heather again per receptionist not working today. Angie has not came in yet, but will have her to RTC...Raechel Chute/LMB

## 2017-01-16 NOTE — Telephone Encounter (Signed)
Heather never call back closing encounter...Raechel Chute/lmb

## 2017-01-21 DIAGNOSIS — H00025 Hordeolum internum left lower eyelid: Secondary | ICD-10-CM | POA: Diagnosis not present

## 2017-01-21 DIAGNOSIS — H401134 Primary open-angle glaucoma, bilateral, indeterminate stage: Secondary | ICD-10-CM | POA: Diagnosis not present

## 2017-01-21 DIAGNOSIS — H01004 Unspecified blepharitis left upper eyelid: Secondary | ICD-10-CM | POA: Diagnosis not present

## 2017-01-21 DIAGNOSIS — H53031 Strabismic amblyopia, right eye: Secondary | ICD-10-CM | POA: Diagnosis not present

## 2017-01-21 DIAGNOSIS — H01001 Unspecified blepharitis right upper eyelid: Secondary | ICD-10-CM | POA: Diagnosis not present

## 2017-01-28 DIAGNOSIS — L603 Nail dystrophy: Secondary | ICD-10-CM | POA: Diagnosis not present

## 2017-01-28 DIAGNOSIS — M2010 Hallux valgus (acquired), unspecified foot: Secondary | ICD-10-CM | POA: Diagnosis not present

## 2017-01-28 DIAGNOSIS — R600 Localized edema: Secondary | ICD-10-CM | POA: Diagnosis not present

## 2017-01-28 DIAGNOSIS — B351 Tinea unguium: Secondary | ICD-10-CM | POA: Diagnosis not present

## 2017-01-28 DIAGNOSIS — Q845 Enlarged and hypertrophic nails: Secondary | ICD-10-CM | POA: Diagnosis not present

## 2017-01-28 DIAGNOSIS — L853 Xerosis cutis: Secondary | ICD-10-CM | POA: Diagnosis not present

## 2017-01-31 ENCOUNTER — Emergency Department (HOSPITAL_COMMUNITY): Payer: Medicare Other

## 2017-01-31 ENCOUNTER — Inpatient Hospital Stay (HOSPITAL_COMMUNITY)
Admission: EM | Admit: 2017-01-31 | Discharge: 2017-02-11 | DRG: 193 | Disposition: A | Discharge status: Skilled Nursing Facility | Payer: Medicare Other | Attending: Internal Medicine | Admitting: Internal Medicine

## 2017-01-31 ENCOUNTER — Encounter (HOSPITAL_COMMUNITY): Payer: Self-pay

## 2017-01-31 DIAGNOSIS — J189 Pneumonia, unspecified organism: Secondary | ICD-10-CM | POA: Diagnosis present

## 2017-01-31 DIAGNOSIS — E785 Hyperlipidemia, unspecified: Secondary | ICD-10-CM | POA: Diagnosis not present

## 2017-01-31 DIAGNOSIS — E039 Hypothyroidism, unspecified: Secondary | ICD-10-CM | POA: Diagnosis not present

## 2017-01-31 DIAGNOSIS — J1108 Influenza due to unidentified influenza virus with specified pneumonia: Principal | ICD-10-CM | POA: Diagnosis present

## 2017-01-31 DIAGNOSIS — G309 Alzheimer's disease, unspecified: Secondary | ICD-10-CM | POA: Diagnosis present

## 2017-01-31 DIAGNOSIS — J9601 Acute respiratory failure with hypoxia: Secondary | ICD-10-CM | POA: Diagnosis present

## 2017-01-31 DIAGNOSIS — K5900 Constipation, unspecified: Secondary | ICD-10-CM | POA: Diagnosis present

## 2017-01-31 DIAGNOSIS — R079 Chest pain, unspecified: Secondary | ICD-10-CM

## 2017-01-31 DIAGNOSIS — Z7982 Long term (current) use of aspirin: Secondary | ICD-10-CM

## 2017-01-31 DIAGNOSIS — Z79899 Other long term (current) drug therapy: Secondary | ICD-10-CM

## 2017-01-31 DIAGNOSIS — J159 Unspecified bacterial pneumonia: Secondary | ICD-10-CM | POA: Diagnosis present

## 2017-01-31 DIAGNOSIS — Q909 Down syndrome, unspecified: Secondary | ICD-10-CM

## 2017-01-31 DIAGNOSIS — J90 Pleural effusion, not elsewhere classified: Secondary | ICD-10-CM | POA: Diagnosis not present

## 2017-01-31 DIAGNOSIS — Z801 Family history of malignant neoplasm of trachea, bronchus and lung: Secondary | ICD-10-CM

## 2017-01-31 DIAGNOSIS — F79 Unspecified intellectual disabilities: Secondary | ICD-10-CM | POA: Diagnosis present

## 2017-01-31 DIAGNOSIS — J111 Influenza due to unidentified influenza virus with other respiratory manifestations: Secondary | ICD-10-CM

## 2017-01-31 DIAGNOSIS — F0280 Dementia in other diseases classified elsewhere without behavioral disturbance: Secondary | ICD-10-CM

## 2017-01-31 DIAGNOSIS — D7589 Other specified diseases of blood and blood-forming organs: Secondary | ICD-10-CM | POA: Diagnosis present

## 2017-01-31 DIAGNOSIS — R0902 Hypoxemia: Secondary | ICD-10-CM

## 2017-01-31 DIAGNOSIS — Z823 Family history of stroke: Secondary | ICD-10-CM

## 2017-01-31 LAB — BASIC METABOLIC PANEL
ANION GAP: 9 (ref 5–15)
BUN: 12 mg/dL (ref 6–20)
CHLORIDE: 100 mmol/L — AB (ref 101–111)
CO2: 29 mmol/L (ref 22–32)
Calcium: 8.6 mg/dL — ABNORMAL LOW (ref 8.9–10.3)
Creatinine, Ser: 1.13 mg/dL (ref 0.61–1.24)
Glucose, Bld: 115 mg/dL — ABNORMAL HIGH (ref 65–99)
POTASSIUM: 4 mmol/L (ref 3.5–5.1)
Sodium: 138 mmol/L (ref 135–145)

## 2017-01-31 LAB — CBC
HCT: 39.5 % (ref 39.0–52.0)
HEMATOCRIT: 43.3 % (ref 39.0–52.0)
Hemoglobin: 14.7 g/dL (ref 13.0–17.0)
Hemoglobin: 13.5 g/dL (ref 13.0–17.0)
MCH: 36.4 pg — ABNORMAL HIGH (ref 26.0–34.0)
MCH: 36.6 pg — ABNORMAL HIGH (ref 26.0–34.0)
MCHC: 33.9 g/dL (ref 30.0–36.0)
MCHC: 34.2 g/dL (ref 30.0–36.0)
MCV: 107.2 fL — ABNORMAL HIGH (ref 78.0–100.0)
MCV: 107 fL — AB (ref 78.0–100.0)
PLATELETS: 109 10*3/uL — AB (ref 150–400)
Platelets: 126 10*3/uL — ABNORMAL LOW (ref 150–400)
RBC: 4.04 MIL/uL — AB (ref 4.22–5.81)
RBC: 3.69 MIL/uL — AB (ref 4.22–5.81)
RDW: 15.7 % — AB (ref 11.5–15.5)
RDW: 15.6 % — AB (ref 11.5–15.5)
WBC: 3.7 10*3/uL — AB (ref 4.0–10.5)
WBC: 2.8 10*3/uL — AB (ref 4.0–10.5)

## 2017-01-31 LAB — TROPONIN I: Troponin I: <0.03 ng/mL (ref <0.03)

## 2017-01-31 LAB — I-STAT CG4 LACTIC ACID, ED: LACTIC ACID, VENOUS: 0.93 mmol/L (ref 0.5–1.9)

## 2017-01-31 LAB — CREATININE, SERUM: Creatinine, Ser: 0.99 mg/dL (ref 0.61–1.24)

## 2017-01-31 LAB — I-STAT TROPONIN, ED: Troponin i, poc: 0 ng/mL (ref 0.00–0.08)

## 2017-01-31 LAB — INFLUENZA PANEL BY PCR (TYPE A & B)
INFLAPCR: POSITIVE — AB
INFLBPCR: NEGATIVE

## 2017-01-31 MED ORDER — ACETAMINOPHEN 325 MG PO TABS: 650.0000 mg | ORAL_TABLET | Freq: Four times a day (QID) | ORAL | Status: DC | PRN

## 2017-01-31 MED ORDER — ADULT MULTIVITAMIN W/MINERALS CH
1.0000 | ORAL_TABLET | Freq: Every day | ORAL | Status: DC
  Administered 2017-01-31 – 2017-02-11 (×12): 1 via ORAL
  Filled 2017-01-31 (×13): qty 1

## 2017-01-31 MED ORDER — MEMANTINE HCL 10 MG PO TABS
10.0000 mg | ORAL_TABLET | Freq: Two times a day (BID) | ORAL | Status: DC
Start: 2017-01-31 — End: 2017-02-11
  Administered 2017-01-31 – 2017-02-11 (×22): 10 mg via ORAL
  Filled 2017-01-31 (×24): qty 1

## 2017-01-31 MED ORDER — ACETAMINOPHEN 650 MG RE SUPP: 650.0000 mg | Freq: Four times a day (QID) | RECTAL | Status: DC | PRN

## 2017-01-31 MED ORDER — POLYETHYLENE GLYCOL 3350 17 G PO PACK
17.0000 g | PACK | Freq: Every day | ORAL | Status: DC | PRN
  Administered 2017-02-09: 17 g via ORAL
  Filled 2017-01-31 (×2): qty 1

## 2017-01-31 MED ORDER — HEPARIN SODIUM (PORCINE) 5000 UNIT/ML IJ SOLN
5000.0000 [IU] | Freq: Three times a day (TID) | INTRAMUSCULAR | Status: DC
  Administered 2017-01-31 – 2017-02-11 (×30): 5000 [IU] via SUBCUTANEOUS
  Filled 2017-01-31 (×28): qty 1

## 2017-01-31 MED ORDER — ONDANSETRON HCL 4 MG PO TABS: 4.0000 mg | ORAL_TABLET | Freq: Four times a day (QID) | ORAL | Status: DC | PRN

## 2017-01-31 MED ORDER — SERTRALINE HCL 50 MG PO TABS
50.0000 mg | ORAL_TABLET | Freq: Every day | ORAL | Status: DC
  Administered 2017-01-31 – 2017-02-11 (×12): 50 mg via ORAL
  Filled 2017-01-31 (×12): qty 1

## 2017-01-31 MED ORDER — CHLORHEXIDINE GLUCONATE 0.12 % MT SOLN
15.0000 mL | Freq: Two times a day (BID) | OROMUCOSAL | Status: DC
  Administered 2017-01-31 – 2017-02-10 (×9): 15 mL via OROMUCOSAL
  Filled 2017-01-31 (×16): qty 15

## 2017-01-31 MED ORDER — LEVOTHYROXINE SODIUM 50 MCG PO TABS
50.0000 ug | ORAL_TABLET | Freq: Every day | ORAL | Status: DC
  Administered 2017-02-01 – 2017-02-02 (×2): 50 ug via ORAL
  Filled 2017-01-31 (×2): qty 1

## 2017-01-31 MED ORDER — DEXTROSE 5 % IV SOLN
1.0000 g | Freq: Once | INTRAVENOUS | Status: AC
  Administered 2017-01-31: 1 g via INTRAVENOUS
  Filled 2017-01-31: qty 10

## 2017-01-31 MED ORDER — DEXTROSE 5 % IV SOLN
500.0000 mg | Freq: Once | INTRAVENOUS | Status: AC
  Administered 2017-01-31: 500 mg via INTRAVENOUS
  Filled 2017-01-31: qty 500

## 2017-01-31 MED ORDER — DIVALPROEX SODIUM ER 500 MG PO TB24
750.0000 mg | ORAL_TABLET | Freq: Every day | ORAL | Status: DC
  Administered 2017-02-01 – 2017-02-11 (×11): 750 mg via ORAL
  Filled 2017-01-31 (×12): qty 1

## 2017-01-31 MED ORDER — OSELTAMIVIR PHOSPHATE 75 MG PO CAPS
75.0000 mg | ORAL_CAPSULE | Freq: Two times a day (BID) | ORAL | Status: DC
  Administered 2017-01-31 – 2017-02-06 (×12): 75 mg via ORAL
  Filled 2017-01-31 (×13): qty 1

## 2017-01-31 MED ORDER — SIMVASTATIN 20 MG PO TABS
20.0000 mg | ORAL_TABLET | Freq: Every day | ORAL | Status: DC
  Administered 2017-01-31 – 2017-02-10 (×11): 20 mg via ORAL
  Filled 2017-01-31 (×11): qty 1

## 2017-01-31 MED ORDER — GUAIFENESIN-DM 100-10 MG/5ML PO SYRP: 5.0000 mL | ORAL_SOLUTION | ORAL | Status: DC | PRN

## 2017-01-31 MED ORDER — IPRATROPIUM-ALBUTEROL 0.5-2.5 (3) MG/3ML IN SOLN
3.0000 mL | Freq: Four times a day (QID) | RESPIRATORY_TRACT | Status: DC
  Administered 2017-01-31 – 2017-02-01 (×5): 3 mL via RESPIRATORY_TRACT
  Filled 2017-01-31 (×6): qty 3

## 2017-01-31 MED ORDER — DEXTROSE 5 % IV SOLN
500.0000 mg | Freq: Once | INTRAVENOUS | Status: DC
  Filled 2017-01-31: qty 500

## 2017-01-31 MED ORDER — SODIUM CHLORIDE 0.9 % IV SOLN
INTRAVENOUS | Status: DC
  Administered 2017-01-31 – 2017-02-02 (×3): via INTRAVENOUS

## 2017-01-31 MED ORDER — ONDANSETRON HCL 4 MG/2ML IJ SOLN: 4.0000 mg | Freq: Four times a day (QID) | INTRAMUSCULAR | Status: DC | PRN

## 2017-01-31 MED ORDER — ASPIRIN EC 81 MG PO TBEC
81.0000 mg | DELAYED_RELEASE_TABLET | Freq: Every day | ORAL | Status: DC
  Administered 2017-02-01 – 2017-02-11 (×11): 81 mg via ORAL
  Filled 2017-01-31 (×11): qty 1

## 2017-01-31 MED ORDER — LATANOPROST 0.005 % OP SOLN
1.0000 [drp] | Freq: Every day | OPHTHALMIC | Status: DC
  Administered 2017-01-31 – 2017-02-10 (×11): 1 [drp] via OPHTHALMIC
  Filled 2017-01-31 (×2): qty 2.5

## 2017-01-31 MED ORDER — OSELTAMIVIR PHOSPHATE 75 MG PO CAPS
75.0000 mg | ORAL_CAPSULE | Freq: Once | ORAL | Status: AC
  Administered 2017-01-31: 75 mg via ORAL
  Filled 2017-01-31: qty 1

## 2017-01-31 MED ORDER — DEXTROSE 5 % IV SOLN
1.0000 g | INTRAVENOUS | Status: AC
Start: 2017-02-01 — End: 2017-02-07
  Administered 2017-02-01 – 2017-02-07 (×7): 1 g via INTRAVENOUS
  Filled 2017-01-31 (×7): qty 10

## 2017-01-31 MED ORDER — DEXTROSE 5 % IV SOLN
500.0000 mg | INTRAVENOUS | Status: DC
  Administered 2017-02-01: 500 mg via INTRAVENOUS
  Filled 2017-01-31 (×2): qty 500

## 2017-01-31 MED ORDER — DONEPEZIL HCL 10 MG PO TABS
10.0000 mg | ORAL_TABLET | Freq: Every day | ORAL | Status: DC
Start: 2017-01-31 — End: 2017-02-11
  Administered 2017-01-31 – 2017-02-10 (×11): 10 mg via ORAL
  Filled 2017-01-31 (×14): qty 1

## 2017-01-31 MED ORDER — QUETIAPINE FUMARATE 50 MG PO TABS
50.0000 mg | ORAL_TABLET | Freq: Every day | ORAL | Status: DC
  Administered 2017-01-31 – 2017-02-10 (×11): 50 mg via ORAL
  Filled 2017-01-31 (×12): qty 1

## 2017-01-31 MED ORDER — SODIUM CHLORIDE 0.9 % IV BOLUS (SEPSIS)
1000.0000 mL | Freq: Once | INTRAVENOUS | Status: AC
  Administered 2017-01-31: 1000 mL via INTRAVENOUS

## 2017-01-31 NOTE — ED Notes (Signed)
Pt given more apple sauce and apple juice.

## 2017-01-31 NOTE — ED Notes (Signed)
Pt transported to xray 

## 2017-01-31 NOTE — ED Notes (Signed)
Attempted report x 3, currently on hold waiting for nurse.

## 2017-01-31 NOTE — ED Notes (Signed)
Attempted report x 2 

## 2017-01-31 NOTE — ED Notes (Signed)
Attempted report x1. 

## 2017-01-31 NOTE — ED Notes (Signed)
SpO2 88-90% on RA. Placed on 2L , SpO2 improved to 96%.

## 2017-01-31 NOTE — ED Triage Notes (Signed)
Per EMS [facility]. Staff reports pt was having CP this morning. Pt answers all questions with "yes." Staff stated pt was holding chest and appeared to be in pain. Poor cap refill in all extremities. Mental status at baseline per staff. Pt smiling, NAD, VSS. Given 324mg  aspirin

## 2017-01-31 NOTE — ED Notes (Signed)
Awaiting pharmacy to verify medications and times

## 2017-01-31 NOTE — ED Provider Notes (Signed)
[facility]-EMERGENCY DEPT Provider Note   CSN: [id number] Arrival date & time: 01/31/17  0932     History   Chief Complaint Chief Complaint  Patient presents with  . Chest Pain    HPI Travis Sampson is a 61 y.o. male.  HPI   Pt with hx Down's syndrome, alzheimer's disease, lives in memory care sent to ED for chest pain.  Patient's guardian reports she visited him yesterday and was told he had "a cold"  She notes this morning he was agitated and c/o chest pain.  She does not know how long he has been sick.  Level V caveat for dementia and mental retardation.   Past Medical History:  Diagnosis Date  . Alzheimer disease   . Dementia   . Down's syndrome   . Hyperlipidemia   . Hypothyroidism   . Macrocytosis 2007   MCV 104 w/o anemia  . SOB (shortness of breath)     Patient Active Problem List   Diagnosis Date Noted  . CAP (community acquired pneumonia) 01/31/2017  . Unsteady gait 12/04/2016  . Bilateral leg edema 12/04/2016  . Raynaud's phenomenon 08/31/2014  . DOE (dyspnea on exertion) 07/30/2014  . Bradycardia 07/30/2014  . Macrocytosis without anemia 01/03/2014  . Syncope 07/14/2013  . Peripheral neuropathy (HCC) 07/14/2013  . Memory loss 09/29/2012  . Abnormal ECG 10/16/2011  . Hyperglycemia 07/01/2009  . Hypothyroidism 06/16/2007  . Hyperlipidemia 06/16/2007  . DOWN SYNDROME 06/16/2007    Past Surgical History:  Procedure Laterality Date  . CATARACT EXTRACTION, BILATERAL     Dr Wilkie Aye  . COLONOSCOPY  2007   internal hemorrhoids  . MULTIPLE TOOTH EXTRACTIONS    . TONSILLECTOMY         Home Medications    Prior to Admission medications   Medication Sig Start Date End Date Taking? Authorizing Provider  aluminum & magnesium hydroxide-simethicone (MYLANTA) 500-450-40 MG/5ML suspension Take by mouth every 4 (four) hours as needed for indigestion.   Yes Historical Provider, MD  aspirin 81 MG tablet Take 81 mg by mouth daily.     Yes Historical  Provider, MD  B Complex-C-Folic Acid (VIRT-VITE PLUS PO) Take by mouth daily before breakfast.   Yes Historical Provider, MD  chlorhexidine (PERIDEX) 0.12 % solution Use as directed 15 mLs in the mouth or throat 2 (two) times daily.   Yes Historical Provider, MD  Dextromethorphan-Guaifenesin (ROBITUSSIN DM) 10-100 MG/5ML liquid Take 5 mLs by mouth as needed. For cough   Yes Historical Provider, MD  diphenhydrAMINE (BENADRYL) 12.5 MG/5ML liquid Take 12.5 mg by mouth 3 (three) times daily as needed for allergies.   Yes Historical Provider, MD  divalproex (DEPAKOTE ER) 250 MG 24 hr tablet Take 750 mg by mouth daily. Dr.Milled rx'ed   Yes Historical Provider, MD  donepezil (ARICEPT) 10 MG tablet Take 10 mg by mouth at bedtime. Dr.Miller rx'ed   Yes Historical Provider, MD  Eyelid Cleansers (OCUSOFT LID SCRUB) PADS Apply 1 each topically daily. RX'ed by Dr.Digby   Yes Historical Provider, MD  hydrochlorothiazide (HYDRODIURIL) 12.5 MG tablet Take 1 tablet (12.5 mg total) by mouth daily. 12/04/16  Yes Pincus Sanes, MD  latanoprost (XALATAN) 0.005 % ophthalmic solution Place 1 drop into both eyes at bedtime.   Yes Historical Provider, MD  levothyroxine (SYNTHROID, LEVOTHROID) 50 MCG tablet Take 50 mcg by mouth daily.     Yes Historical Provider, MD  memantine (NAMENDA) 10 MG tablet Take 10 mg by mouth 2 (two) times  daily.   Yes Historical Provider, MD  Multiple Vitamin (MULTIVITAMIN) tablet Take 1 tablet by mouth daily.   Yes Historical Provider, MD  Ophthalmic Irrigation Solution (OCUSOFT EYE WASH OP) Apply to eye. At bedtime   Yes Historical Provider, MD  QUEtiapine (SEROQUEL) 25 MG tablet Take 50 mg by mouth at bedtime.    Yes Historical Provider, MD  sertraline (ZOLOFT) 50 MG tablet Take 50 mg by mouth daily.   Yes Historical Provider, MD  simvastatin (ZOCOR) 20 MG tablet Take 20 mg by mouth daily.   Yes Historical Provider, MD  atorvastatin (LIPITOR) 10 MG tablet Take 1 tablet (10 mg total) by mouth  daily. Patient not taking: Reported on 01/31/2017 10/24/16   Pincus SanesStacy J Burns, MD    Family History Family History  Problem Relation Age of Onset  . Stroke Father   . Lung cancer Sister     smoker  . Diabetes Neg Hx   . Heart disease Neg Hx     Social History Social History  Substance Use Topics  . Smoking status: Never Smoker  . Smokeless tobacco: Never Used  . Alcohol use No     Allergies   Patient has no known allergies.   Review of Systems Review of Systems  All other systems reviewed and are negative.    Physical Exam Updated Vital Signs BP 121/89   Pulse 61   Temp 99.1 F (37.3 C) (Oral)   Resp 13   SpO2 99%   Physical Exam  Constitutional: He appears well-developed and well-nourished. He is sleeping. No distress.  Pt sleeping.  O2 sat 88-90% on room air.  Pt jerks awake and does not want to be examined.    HENT:  Head: Normocephalic and atraumatic.  Neck: Neck supple.  Cardiovascular: Normal rate and regular rhythm.   Pulmonary/Chest: Effort normal. No accessory muscle usage. He has no wheezes. He has rhonchi.  Harsh cough   Skin: He is not diaphoretic.  Nursing note and vitals reviewed.    ED Treatments / Results  Labs (all labs ordered are listed, but only abnormal results are displayed) Labs Reviewed  BASIC METABOLIC PANEL - Abnormal; Notable for the following:       Result Value   Chloride 100 (*)    Glucose, Bld 115 (*)    Calcium 8.6 (*)    All other components within normal limits  CBC - Abnormal; Notable for the following:    WBC 3.7 (*)    RBC 4.04 (*)    MCV 107.2 (*)    MCH 36.4 (*)    RDW 15.7 (*)    Platelets 126 (*)    All other components within normal limits  INFLUENZA PANEL BY PCR (TYPE A & B) - Abnormal; Notable for the following:    Influenza A By PCR POSITIVE (*)    All other components within normal limits  I-STAT TROPOININ, ED  I-STAT CG4 LACTIC ACID, ED    EKG  EKG Interpretation  Date/Time:  Thursday January 31 2017 09:34:36 EST Ventricular Rate:  66 PR Interval:    QRS Duration: 102 QT Interval:  457 QTC Calculation: 479 R Axis:   -21 Text Interpretation:  Sinus rhythm Abnormal R-wave progression, early transition Borderline T wave abnormalities in diffuse leads Interpretation limited secondary to artifact No significant change since last tracing Confirmed by KNOTT MD, Reuel BoomANIEL [id number]) on 01/31/2017 10:21:42 AM       Radiology Dg Chest 2 View  Result Date:  01/31/2017 CLINICAL DATA:  Generalized chest pain, shortness of breath. The patient is mentally challenged. EXAM: CHEST  2 VIEW COMPARISON:  Chest x-ray of July 04, 2013 and chest CT scan of July 05, 2013 FINDINGS: The lungs are adequately inflated. New abnormal interstitial and alveolar opacity is present at the lung bases greatest on the left. No definite pleural effusion is observed. The heart borders are obscured. The pulmonary vascularity is not clearly engorged. There is calcification in the wall of the aortic arch. There is multilevel degenerative disc disease of the visualized portions of the thoracic spine. IMPRESSION: Findings worrisome for lingular and right middle and anterior lower lobe pneumonia. No definite pulmonary edema or pleural effusion. Followup PA and lateral chest X-ray is recommended in 3-4 weeks following trial of antibiotic therapy to ensure resolution and exclude underlying malignancy. Thoracic aortic atherosclerosis. Electronically Signed   By: David  [place] M.D.   On: 01/31/2017 10:31    Procedures Procedures (including critical care time)  Medications Ordered in ED Medications  azithromycin (ZITHROMAX) 500 mg in dextrose 5 % 250 mL IVPB (not administered)  oseltamivir (TAMIFLU) capsule 75 mg (not administered)  sodium chloride 0.9 % bolus 1,000 mL (0 mLs Intravenous Stopped 01/31/17 1050)  cefTRIAXone (ROCEPHIN) 1 g in dextrose 5 % 50 mL IVPB (1 g Intravenous New Bag/Given 01/31/17 1047)     Initial Impression /  Assessment and Plan / ED Course  I have reviewed the triage vital signs and the nursing notes.  Pertinent labs & imaging results that were available during my care of the patient were reviewed by me and considered in my medical decision making (see chart for details).     Pt with hx Down's syndrome and dementia, living in a memory care unit with reported chest pain and agitation, cough, found to have pneumonia.  Hypoxia on room air, O2 by nasal cannula placed.  CXR demonstrates pneumonia.  Started rocephin and azithromycin.  Discussed with [facility] PA as well as pharmacist regarding guidelines as pt lives in a facility.  Under currently guidelines, rocephin/azithro reported to be appropriate.  Admitted to [facility].  After admission, pt also noted to have influenza.    Final Clinical Impressions(s) / ED Diagnoses   Final diagnoses:  Community acquired pneumonia, unspecified laterality  Influenza    New Prescriptions New Prescriptions   No medications on file     Gary, PA-C 01/31/17 1126    Earling, PA-C 01/31/17 1129    Lyndal Pulley, MD 01/31/17 1816

## 2017-01-31 NOTE — ED Notes (Signed)
Currently waiting for pharmacy to reconcile medications.

## 2017-01-31 NOTE — ED Notes (Signed)
Attempted to call Ursula AlertRobin Stiles to update her on pt bed assignment. Unable to reach by phone, this nurse left a voice message.

## 2017-01-31 NOTE — H&P (Signed)
History and Physical    Travis Sampson HQI:[id number] DOB: 03/22/1956 DOA: 01/31/2017  PCP: Pincus SanesStacy J Burns, MD  Patient coming from: SNF   Chief Complaint: chest pain, agitation, cold symptoms  HPI: Travis Sampson is a 61 y.o. male with medical history significant of Down's syndrome, Alzheimer dementia, hyperlipidemia, hypothyroidism who was transferred to the ED after he was found in the facility agitated, holding his chest and complaining if chest pain. His guardian reported that patient has been having flu symptoms of cold for couple of days and at baseline he is not recognizing people and is not conversaant   ED Course: On arrival to the ED patient was mildly febrile with temperature 99.90F, his SpO2 dropped to 88% on RA initially and improved after patient was placed on supplemental Oxygen Blood work showed normal CBC and unremarkable CMP, chest XRay showed lingular and right middle and anterior lower lobe infiltrate  Review of Systems: unable to obtain ROS d/t advanced dementia  Ambulatory Status: ambulates with assistance, uses wheelchair  Past Medical History:  Diagnosis Date  . Alzheimer disease   . Dementia   . Down's syndrome   . Hyperlipidemia   . Hypothyroidism   . Macrocytosis 2007   MCV 104 w/o anemia  . SOB (shortness of breath)     Past Surgical History:  Procedure Laterality Date  . CATARACT EXTRACTION, BILATERAL     Dr Wilkie Ayeigby/ [facility]  . COLONOSCOPY  2007   internal hemorrhoids  . MULTIPLE TOOTH EXTRACTIONS    . TONSILLECTOMY      Social History   Social History  . Marital status: Single    Spouse name: N/A  . Number of children: N/A  . Years of education: N/A   Occupational History  . 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 Smoking status: Never Smoker  . Smokeless tobacco: Never Used  . Alcohol use No  . Drug use: No  . Sexual activity: Not on file   Other Topics Concern  . Not on file   Social  History Narrative  . No narrative on file    No Known Allergies  Family History  Problem Relation Age of Onset  . Stroke Father   . Lung cancer Sister     smoker  . Diabetes Neg Hx   . Heart disease Neg Hx     Prior to Admission medications   Medication Sig Start Date End Date Taking? Authorizing Provider  B Complex-C-Folic Acid (VIRT-VITE PLUS PO) Take by mouth daily before breakfast.   Yes Historical Provider, MD  aluminum & magnesium hydroxide-simethicone (MYLANTA) 500-450-40 MG/5ML suspension Take by mouth every 4 (four) hours as needed for indigestion.    Historical Provider, MD  aspirin 81 MG tablet Take 81 mg by mouth daily.      Historical Provider, MD  atorvastatin (LIPITOR) 10 MG tablet Take 1 tablet (10 mg total) by mouth daily. 10/24/16   Pincus SanesStacy J Burns, MD  chlorhexidine (PERIDEX) 0.12 % solution Use as directed 15 mLs in the mouth or throat 2 (two) times daily.    Historical Provider, MD  Dextromethorphan-Guaifenesin (ROBITUSSIN DM) 10-100 MG/5ML liquid Take 5 mLs by mouth as needed. For cough    Historical Provider, MD  diphenhydrAMINE (BENADRYL) 12.5 MG/5ML liquid Take 12.5 mg by mouth 3 (three) times daily as needed for allergies.    Historical Provider, MD  divalproex (DEPAKOTE ER) 250 MG 24 hr tablet Take 750 mg  by mouth daily. Dr.Milled rx'ed    Historical Provider, MD  donepezil (ARICEPT) 10 MG tablet Take 10 mg by mouth at bedtime. Dr.Miller rx'ed    Historical Provider, MD  Eyelid Cleansers (OCUSOFT LID SCRUB) PADS Apply 1 each topically daily. RX'ed by Dr.Digby    Historical Provider, MD  hydrochlorothiazide (HYDRODIURIL) 12.5 MG tablet Take 1 tablet (12.5 mg total) by mouth daily. 12/04/16   Pincus Sanes, MD  levothyroxine (SYNTHROID, LEVOTHROID) 50 MCG tablet Take 50 mcg by mouth daily.      Historical Provider, MD  Multiple Vitamin (MULTIVITAMIN) tablet Take 1 tablet by mouth daily.    Historical Provider, MD  Ophthalmic Irrigation Solution (OCUSOFT EYE WASH OP)  Apply to eye. At bedtime    Historical Provider, MD  QUEtiapine (SEROQUEL) 25 MG tablet Take 25 mg by mouth at bedtime.    Historical Provider, MD  sertraline (ZOLOFT) 50 MG tablet Take 50 mg by mouth daily.    Historical Provider, MD    Physical Exam: Vitals:   01/31/17 0938 01/31/17 1034  BP: 109/64 121/89  Pulse: 68 61  Resp: 18 13  Temp: 99.1 F (37.3 C)   TempSrc: Oral   SpO2: 95% 99%     General: Appears calm and comfortable, sleepy Eyes: PERRLA, EOMI, normal lids, iris ENT:  grossly normal hearing, lips & tongue, mucous membranes moist and intact Neck: no lymphoadenopathy, masses or thyromegaly Cardiovascular: RRR, no m/r/g. No JVD, carotid bruits. Trace LE edema.  Respiratory: bilateral mild wheezes and rhonchi R/L. Normal respiratory effort. No accessory muscle use observed Abdomen: soft, non-tender, non-distended, no organomegaly or masses appreciated. BS present in all quadrants Skin: no rash, ulcers or induration seen on limited exam Musculoskeletal: grossly normal tone BUE/BLE, good ROM, no bony abnormality or joint deformities observed Psychiatric: grossly normal mood and affect, speech fluent and appropriate, alert and oriented x3 Neurologic: CN II-XII grossly intact, moves all extremities in coordinated fashion, sensation intact  Labs on Admission: I have personally reviewed following labs and imaging studies  CBC, BMP  GFR: CrCl cannot be calculated (Unknown ideal weight.).   Creatinine Clearance: CrCl cannot be calculated (Unknown ideal weight.).    Radiological Exams on Admission: Dg Chest 2 View  Result Date: 01/31/2017 CLINICAL DATA:  Generalized chest pain, shortness of breath. The patient is mentally challenged. EXAM: CHEST  2 VIEW COMPARISON:  Chest x-ray of July 04, 2013 and chest CT scan of July 05, 2013 FINDINGS: The lungs are adequately inflated. New abnormal interstitial and alveolar opacity is present at the lung bases greatest on the left.  No definite pleural effusion is observed. The heart borders are obscured. The pulmonary vascularity is not clearly engorged. There is calcification in the wall of the aortic arch. There is multilevel degenerative disc disease of the visualized portions of the thoracic spine. IMPRESSION: Findings worrisome for lingular and right middle and anterior lower lobe pneumonia. No definite pulmonary edema or pleural effusion. Followup PA and lateral chest X-ray is recommended in 3-4 weeks following trial of antibiotic therapy to ensure resolution and exclude underlying malignancy. Thoracic aortic atherosclerosis. Electronically Signed   By: David  [place] M.D.   On: 01/31/2017 10:31    EKG: Independently reviewed - SR, NS ST-TW changes  Assessment/Plan Principal Problem:   CAP (community acquired pneumonia) Active Problems:   Hypothyroidism   Hyperlipidemia   DOWN SYNDROME   Dementia due to Alzheimer's disease   Chest pain   Influenza A with viral vs bacterial CAP  Continue IV antibiotics, Tamiflu, nebulizer treatments, supplemental O2 Patient needs repeat XRay in 3-4 weeks to assure resolution of PNA as recommended by radiology to r/o lung mass  Chest pain EKG has without new changes First Troponin is negative Continue to cycle cardiac enzymes and monitor on telemetry Doubt his chest pain is of cardiac etiology, most likely associated with CAP  Down syndrome and advanced dementia At baseline patient is not recognizing his guardian, non-conversant monitor for safety to prevent falls and provide supportive care Continue Namenda, Aricept, Seroquel, Depakote  Hyperlipidemia Continue Zocor  Hypothyroidism Continue Synthroid    DVT prophylaxis: Heparin Code Status: Full Family Communication: none Disposition Plan: Med Surg/tele Consults called: none Admission status: observation   Raymon Mutton, [place] Pager: [phone] [facility]  If 7PM-7AM, please contact  night-coverage [email or web address] Password [email or web address]  01/31/2017, 11:48 AM

## 2017-01-31 NOTE — ED Notes (Signed)
Pt caregiver requesting to be called with any important updates. Pt caregiver to be notified when pt has a bed assignment and is going upstairs.   Ursula AlertRobin Stiles: [phone]

## 2017-01-31 NOTE — ED Notes (Addendum)
Pharmacy called to verify pt medications. Per pharmacy, will verify medications when pt transported to floor.

## 2017-01-31 NOTE — ED Notes (Signed)
Food tray ordered for pt. Pt given [place]key sandwich, apple sauce, and apple juice by Tammy SoursGreg, RN.

## 2017-02-01 DIAGNOSIS — E785 Hyperlipidemia, unspecified: Secondary | ICD-10-CM

## 2017-02-01 DIAGNOSIS — F064 Anxiety disorder due to known physiological condition: Secondary | ICD-10-CM | POA: Diagnosis not present

## 2017-02-01 DIAGNOSIS — Z7982 Long term (current) use of aspirin: Secondary | ICD-10-CM | POA: Diagnosis not present

## 2017-02-01 DIAGNOSIS — E039 Hypothyroidism, unspecified: Secondary | ICD-10-CM | POA: Diagnosis present

## 2017-02-01 DIAGNOSIS — H40219 Acute angle-closure glaucoma, unspecified eye: Secondary | ICD-10-CM | POA: Diagnosis not present

## 2017-02-01 DIAGNOSIS — J159 Unspecified bacterial pneumonia: Secondary | ICD-10-CM | POA: Diagnosis present

## 2017-02-01 DIAGNOSIS — G309 Alzheimer's disease, unspecified: Secondary | ICD-10-CM

## 2017-02-01 DIAGNOSIS — Z801 Family history of malignant neoplasm of trachea, bronchus and lung: Secondary | ICD-10-CM | POA: Diagnosis not present

## 2017-02-01 DIAGNOSIS — F0280 Dementia in other diseases classified elsewhere without behavioral disturbance: Secondary | ICD-10-CM | POA: Diagnosis present

## 2017-02-01 DIAGNOSIS — F79 Unspecified intellectual disabilities: Secondary | ICD-10-CM | POA: Diagnosis present

## 2017-02-01 DIAGNOSIS — T7840XS Allergy, unspecified, sequela: Secondary | ICD-10-CM | POA: Diagnosis not present

## 2017-02-01 DIAGNOSIS — K5900 Constipation, unspecified: Secondary | ICD-10-CM | POA: Diagnosis present

## 2017-02-01 DIAGNOSIS — H01009 Unspecified blepharitis unspecified eye, unspecified eyelid: Secondary | ICD-10-CM | POA: Diagnosis not present

## 2017-02-01 DIAGNOSIS — J181 Lobar pneumonia, unspecified organism: Secondary | ICD-10-CM

## 2017-02-01 DIAGNOSIS — J1108 Influenza due to unidentified influenza virus with specified pneumonia: Secondary | ICD-10-CM | POA: Diagnosis present

## 2017-02-01 DIAGNOSIS — R41841 Cognitive communication deficit: Secondary | ICD-10-CM | POA: Diagnosis not present

## 2017-02-01 DIAGNOSIS — Q909 Down syndrome, unspecified: Secondary | ICD-10-CM

## 2017-02-01 DIAGNOSIS — Z79899 Other long term (current) drug therapy: Secondary | ICD-10-CM | POA: Diagnosis not present

## 2017-02-01 DIAGNOSIS — R1312 Dysphagia, oropharyngeal phase: Secondary | ICD-10-CM | POA: Diagnosis not present

## 2017-02-01 DIAGNOSIS — D7589 Other specified diseases of blood and blood-forming organs: Secondary | ICD-10-CM | POA: Diagnosis present

## 2017-02-01 DIAGNOSIS — E038 Other specified hypothyroidism: Secondary | ICD-10-CM

## 2017-02-01 DIAGNOSIS — F0630 Mood disorder due to known physiological condition, unspecified: Secondary | ICD-10-CM | POA: Diagnosis not present

## 2017-02-01 DIAGNOSIS — F0150 Vascular dementia without behavioral disturbance: Secondary | ICD-10-CM | POA: Diagnosis not present

## 2017-02-01 DIAGNOSIS — Z823 Family history of stroke: Secondary | ICD-10-CM | POA: Diagnosis not present

## 2017-02-01 DIAGNOSIS — R262 Difficulty in walking, not elsewhere classified: Secondary | ICD-10-CM | POA: Diagnosis not present

## 2017-02-01 DIAGNOSIS — J1189 Influenza due to unidentified influenza virus with other manifestations: Secondary | ICD-10-CM | POA: Diagnosis not present

## 2017-02-01 DIAGNOSIS — M6281 Muscle weakness (generalized): Secondary | ICD-10-CM | POA: Diagnosis not present

## 2017-02-01 DIAGNOSIS — J111 Influenza due to unidentified influenza virus with other respiratory manifestations: Secondary | ICD-10-CM | POA: Diagnosis not present

## 2017-02-01 DIAGNOSIS — R2681 Unsteadiness on feet: Secondary | ICD-10-CM | POA: Diagnosis not present

## 2017-02-01 DIAGNOSIS — G608 Other hereditary and idiopathic neuropathies: Secondary | ICD-10-CM | POA: Diagnosis not present

## 2017-02-01 DIAGNOSIS — E569 Vitamin deficiency, unspecified: Secondary | ICD-10-CM | POA: Diagnosis not present

## 2017-02-01 DIAGNOSIS — J189 Pneumonia, unspecified organism: Secondary | ICD-10-CM | POA: Diagnosis not present

## 2017-02-01 DIAGNOSIS — R278 Other lack of coordination: Secondary | ICD-10-CM | POA: Diagnosis not present

## 2017-02-01 DIAGNOSIS — K056 Periodontal disease, unspecified: Secondary | ICD-10-CM | POA: Diagnosis not present

## 2017-02-01 DIAGNOSIS — J90 Pleural effusion, not elsewhere classified: Secondary | ICD-10-CM | POA: Diagnosis present

## 2017-02-01 DIAGNOSIS — J9601 Acute respiratory failure with hypoxia: Secondary | ICD-10-CM | POA: Diagnosis present

## 2017-02-01 LAB — TROPONIN I: Troponin I: <0.03 ng/mL (ref <0.03)

## 2017-02-01 LAB — CBC WITH DIFFERENTIAL/PLATELET
BASOS ABS: 0 10*3/uL (ref 0.0–0.1)
Basophils Relative: 1 %
EOS ABS: 0 10*3/uL (ref 0.0–0.7)
EOS PCT: 0 %
HCT: 36.7 % — ABNORMAL LOW (ref 39.0–52.0)
Hemoglobin: 12.7 g/dL — ABNORMAL LOW (ref 13.0–17.0)
Lymphocytes Relative: 20 %
Lymphs Abs: 0.6 10*3/uL — ABNORMAL LOW (ref 0.7–4.0)
MCH: 36.7 pg — ABNORMAL HIGH (ref 26.0–34.0)
MCHC: 34.6 g/dL (ref 30.0–36.0)
MCV: 106.1 fL — ABNORMAL HIGH (ref 78.0–100.0)
Monocytes Absolute: 0.6 10*3/uL (ref 0.1–1.0)
Monocytes Relative: 20 %
Neutro Abs: 1.8 10*3/uL (ref 1.7–7.7)
Neutrophils Relative %: 60 %
PLATELETS: 108 10*3/uL — AB (ref 150–400)
RBC: 3.46 MIL/uL — AB (ref 4.22–5.81)
RDW: 15.5 % (ref 11.5–15.5)
WBC: 3 10*3/uL — AB (ref 4.0–10.5)

## 2017-02-01 LAB — HIV ANTIBODY (ROUTINE TESTING W REFLEX): HIV SCREEN 4TH GENERATION: NONREACTIVE

## 2017-02-01 LAB — BASIC METABOLIC PANEL
ANION GAP: 8 (ref 5–15)
BUN: 9 mg/dL (ref 6–20)
CO2: 26 mmol/L (ref 22–32)
Calcium: 7.7 mg/dL — ABNORMAL LOW (ref 8.9–10.3)
Chloride: 103 mmol/L (ref 101–111)
Creatinine, Ser: 0.81 mg/dL (ref 0.61–1.24)
GFR calc Af Amer: >60 mL/min (ref >60)
Glucose, Bld: 115 mg/dL — ABNORMAL HIGH (ref 65–99)
POTASSIUM: 3.4 mmol/L — AB (ref 3.5–5.1)
SODIUM: 137 mmol/L (ref 135–145)

## 2017-02-01 MED ORDER — GUAIFENESIN ER 600 MG PO TB12
600.0000 mg | ORAL_TABLET | Freq: Two times a day (BID) | ORAL | Status: DC
  Administered 2017-02-01 – 2017-02-11 (×21): 600 mg via ORAL
  Filled 2017-02-01 (×21): qty 1

## 2017-02-01 MED ORDER — POTASSIUM CHLORIDE CRYS ER 20 MEQ PO TBCR
40.0000 meq | EXTENDED_RELEASE_TABLET | Freq: Four times a day (QID) | ORAL | Status: AC
  Administered 2017-02-01 (×2): 40 meq via ORAL
  Filled 2017-02-01 (×2): qty 2

## 2017-02-01 NOTE — Progress Notes (Signed)
PROGRESS NOTE  Travis Sampson  ZOX:[id number] DOB: 02/24/1956 DOA: 01/31/2017 PCP: Pincus SanesStacy J Burns, MD Outpatient Specialists:  Subjective: Nonconversant, able to tell his name, appears to be comfortable without acute distress. Currently requiring 2-3 L of oxygen to keep his saturation in the mid 90s, will try to wean off of oxygen.  Brief Narrative:  Travis PleasureWilliam W Sampson is a 61 y.o. male with medical history significant of Down's syndrome, Alzheimer dementia, hyperlipidemia, hypothyroidism who was transferred to the ED after he was found in the facility agitated, holding his chest and complaining if chest pain. His guardian reported that patient has been having flu symptoms of cold for couple of days and at baseline he is not recognizing people and is not conversaant  Assessment & Plan:   Principal Problem:   CAP (community acquired pneumonia) Active Problems:   Hypothyroidism   Hyperlipidemia   DOWN SYNDROME   Dementia due to Alzheimer's disease   Chest pain   CAP and Influenza A infection CXR showed lingular pneumonia, PCR positive for influenza A. Started on antibiotics, will continue, Tamiflu also for 5 days. Patient needs repeat XRay in 3-4 weeks to assure resolution of PNA as recommended by radiology to r/o lung mass. Supportive management with bronchodilators, mucolytics and oxygen as needed.  Chest pain EKG has without new changes First Troponin is negative Continue to cycle cardiac enzymes and monitor on telemetry Doubt chest pain of cardiac etiology, likely secondary to his pneumonia.  Down syndrome and advanced dementia At baseline patient is not recognizing his guardian, non-conversant monitor for safety to prevent falls and provide supportive care Continue Namenda, Aricept, Seroquel, Depakote  Hyperlipidemia Continue Zocor  Hypothyroidism Continue Synthroid   DVT prophylaxis: Subcutaneous heparin Code Status: Full Code Family Communication:    Disposition Plan:  Diet: Diet regular Room service appropriate? Yes; Fluid consistency: Thin  Consultants:   None  Procedures:   None  Antimicrobials:   Rocephin and Zithromax   Objective: Vitals:   01/31/17 2207 02/01/17 0326 02/01/17 0430 02/01/17 0927  BP:   (!) 102/54   Pulse:   67   Resp:      Temp:   98.5 F (36.9 C)   TempSrc:   Oral   SpO2: 94% 93% 92% 96%    Intake/Output Summary (Last 24 hours) at 02/01/17 1140 Last data filed at 02/01/17 0600  Gross per 24 hour  Intake             1320 ml  Output                0 ml  Net             1320 ml   There were no vitals filed for this visit.  Examination: General exam: Appears calm and comfortable  Respiratory system: Clear to auscultation. Respiratory effort normal. Cardiovascular system: S1 & S2 heard, RRR. No JVD, murmurs, rubs, gallops or clicks. No pedal edema. Gastrointestinal system: Abdomen is nondistended, soft and nontender. No organomegaly or masses felt. Normal bowel sounds heard. Central nervous system: Alert and oriented. No focal neurological deficits. Extremities: Symmetric 5 x 5 power. Skin: No rashes, lesions or ulcers Psychiatry: Judgement and insight appear normal. Mood & affect appropriate.   Data Reviewed: I have personally reviewed following labs and imaging studies  CBC:  Recent Labs Lab 01/31/17 0930 01/31/17 1258 02/01/17 0003  WBC 3.7* 2.8* 3.0*  NEUTROABS  --   --  1.8  HGB 14.7 13.5 12.7*  HCT 43.3 39.5 36.7*  MCV 107.2* 107.0* 106.1*  PLT 126* 109* 108*   Basic Metabolic Panel:  Recent Labs Lab 01/31/17 0930 01/31/17 1258 02/01/17 0003  NA 138  --  137  K 4.0  --  3.4*  CL 100*  --  103  CO2 29  --  26  GLUCOSE 115*  --  115*  BUN 12  --  9  CREATININE 1.13 0.99 0.81  CALCIUM 8.6*  --  7.7*   GFR: CrCl cannot be calculated (Unknown ideal weight.). Liver Function Tests: No results for input(s): AST, ALT, ALKPHOS, BILITOT, PROT, ALBUMIN in the last 168  hours. No results for input(s): LIPASE, AMYLASE in the last 168 hours. No results for input(s): AMMONIA in the last 168 hours. Coagulation Profile: No results for input(s): INR, PROTIME in the last 168 hours. Cardiac Enzymes:  Recent Labs Lab 01/31/17 1258 01/31/17 1857 02/01/17 0003  TROPONINI <0.03 <0.03 <0.03   BNP (last 3 results) No results for input(s): PROBNP in the last 8760 hours. HbA1C: No results for input(s): HGBA1C in the last 72 hours. CBG: No results for input(s): GLUCAP in the last 168 hours. Lipid Profile: No results for input(s): CHOL, HDL, LDLCALC, TRIG, CHOLHDL, LDLDIRECT in the last 72 hours. Thyroid Function Tests: No results for input(s): TSH, T4TOTAL, FREET4, T3FREE, THYROIDAB in the last 72 hours. Anemia Panel: No results for input(s): VITAMINB12, FOLATE, FERRITIN, TIBC, IRON, RETICCTPCT in the last 72 hours. Urine analysis: No results found for: COLORURINE, APPEARANCEUR, LABSPEC, PHURINE, GLUCOSEU, HGBUR, BILIRUBINUR, KETONESUR, PROTEINUR, UROBILINOGEN, NITRITE, LEUKOCYTESUR Sepsis Labs: @LABRCNTIP (procalcitonin:4,lacticidven:4)  )No results found for this or any previous visit (from the past 240 hour(s)).   Invalid input(s): PROCALCITONIN, LACTICACIDVEN   Radiology Studies: Dg Chest 2 View  Result Date: 01/31/2017 CLINICAL DATA:  Generalized chest pain, shortness of breath. The patient is mentally challenged. EXAM: CHEST  2 VIEW COMPARISON:  Chest x-ray of July 04, 2013 and chest CT scan of July 05, 2013 FINDINGS: The lungs are adequately inflated. New abnormal interstitial and alveolar opacity is present at the lung bases greatest on the left. No definite pleural effusion is observed. The heart borders are obscured. The pulmonary vascularity is not clearly engorged. There is calcification in the wall of the aortic arch. There is multilevel degenerative disc disease of the visualized portions of the thoracic spine. IMPRESSION: Findings worrisome for  lingular and right middle and anterior lower lobe pneumonia. No definite pulmonary edema or pleural effusion. Followup PA and lateral chest X-ray is recommended in 3-4 weeks following trial of antibiotic therapy to ensure resolution and exclude underlying malignancy. Thoracic aortic atherosclerosis. Electronically Signed   By: David  [place] M.D.   On: 01/31/2017 10:31        Scheduled Meds: . aspirin EC  81 mg Oral Daily  . azithromycin  500 mg Intravenous Q24H  . cefTRIAXone (ROCEPHIN)  IV  1 g Intravenous Q24H  . chlorhexidine  15 mL Mouth/Throat BID  . divalproex  750 mg Oral Daily  . donepezil  10 mg Oral QHS  . heparin  5,000 Units Subcutaneous Q8H  . ipratropium-albuterol  3 mL Nebulization Q6H  . latanoprost  1 drop Both Eyes QHS  . levothyroxine  50 mcg Oral QAC breakfast  . memantine  10 mg Oral BID  . multivitamin with minerals  1 tablet Oral Daily  . oseltamivir  75 mg Oral BID  . QUEtiapine  50 mg Oral QHS  . sertraline  50 mg  Oral Daily  . simvastatin  20 mg Oral q1800   Continuous Infusions: . sodium chloride 75 mL/hr at 02/01/17 0444     LOS: 0 days    Time spent: 35 minutes    Ashlee Player A, MD [facility] Pager [phone]  If 7PM-7AM, please contact night-coverage [email or web address] Password [email or web address] 02/01/2017, 11:40 AM

## 2017-02-02 LAB — HEPATIC FUNCTION PANEL
ALK PHOS: 50 U/L (ref 38–126)
ALT: 28 U/L (ref 17–63)
AST: 52 U/L — ABNORMAL HIGH (ref 15–41)
Albumin: 2.1 g/dL — ABNORMAL LOW (ref 3.5–5.0)
BILIRUBIN INDIRECT: 0.3 mg/dL (ref 0.3–0.9)
BILIRUBIN TOTAL: 0.5 mg/dL (ref 0.3–1.2)
Bilirubin, Direct: 0.2 mg/dL (ref 0.1–0.5)
TOTAL PROTEIN: 4.9 g/dL — AB (ref 6.5–8.1)

## 2017-02-02 LAB — FOLATE: Folate: 44.1 ng/mL (ref >5.9)

## 2017-02-02 LAB — CBC
HCT: 37.8 % — ABNORMAL LOW (ref 39.0–52.0)
HEMOGLOBIN: 12.8 g/dL — AB (ref 13.0–17.0)
MCH: 36.5 pg — AB (ref 26.0–34.0)
MCHC: 33.9 g/dL (ref 30.0–36.0)
MCV: 107.7 fL — ABNORMAL HIGH (ref 78.0–100.0)
PLATELETS: 97 10*3/uL — AB (ref 150–400)
RBC: 3.51 MIL/uL — AB (ref 4.22–5.81)
RDW: 16 % — ABNORMAL HIGH (ref 11.5–15.5)
WBC: 5 10*3/uL (ref 4.0–10.5)

## 2017-02-02 LAB — MRSA PCR SCREENING: MRSA BY PCR: NEGATIVE

## 2017-02-02 LAB — BASIC METABOLIC PANEL
ANION GAP: 7 (ref 5–15)
BUN: 7 mg/dL (ref 6–20)
CALCIUM: 7.9 mg/dL — AB (ref 8.9–10.3)
CO2: 25 mmol/L (ref 22–32)
CREATININE: 0.83 mg/dL (ref 0.61–1.24)
Chloride: 106 mmol/L (ref 101–111)
Glucose, Bld: 93 mg/dL (ref 65–99)
Potassium: 4.4 mmol/L (ref 3.5–5.1)
Sodium: 138 mmol/L (ref 135–145)

## 2017-02-02 LAB — TSH: TSH: 7.023 u[IU]/mL — AB (ref 0.350–4.500)

## 2017-02-02 LAB — VITAMIN B12: Vitamin B-12: 1806 pg/mL — ABNORMAL HIGH (ref 180–914)

## 2017-02-02 MED ORDER — AZITHROMYCIN 250 MG PO TABS
500.0000 mg | ORAL_TABLET | Freq: Every day | ORAL | Status: AC
  Administered 2017-02-02 – 2017-02-04 (×3): 500 mg via ORAL
  Filled 2017-02-02 (×3): qty 2

## 2017-02-02 MED ORDER — IPRATROPIUM-ALBUTEROL 0.5-2.5 (3) MG/3ML IN SOLN
3.0000 mL | Freq: Three times a day (TID) | RESPIRATORY_TRACT | Status: DC
  Administered 2017-02-02 – 2017-02-03 (×4): 3 mL via RESPIRATORY_TRACT
  Filled 2017-02-02 (×4): qty 3

## 2017-02-02 MED ORDER — FUROSEMIDE 10 MG/ML IJ SOLN
40.0000 mg | Freq: Once | INTRAMUSCULAR | Status: AC
  Administered 2017-02-02: 40 mg via INTRAVENOUS
  Filled 2017-02-02: qty 4

## 2017-02-02 MED ORDER — WHITE PETROLATUM GEL
Status: AC
  Administered 2017-02-02: 10:00:00
  Filled 2017-02-02: qty 1

## 2017-02-02 MED ORDER — LEVOTHYROXINE SODIUM 75 MCG PO TABS
75.0000 ug | ORAL_TABLET | Freq: Every day | ORAL | Status: DC
  Administered 2017-02-03 – 2017-02-11 (×9): 75 ug via ORAL
  Filled 2017-02-02 (×9): qty 1

## 2017-02-02 NOTE — Progress Notes (Signed)
PROGRESS NOTE  Travis Sampson  ZOX:[id number] DOB: [birth year] DOA: 01/31/2017 PCP: Pincus Sanes, MD Outpatient Specialists:  Subjective: Seen with nursing staff at bedside, patient was still sleepy, mumbles when I ask him about his name today. Does not appear to be in distress.  Brief Narrative:  Travis Sampson is a 60 y.o. male with medical history significant of Down's syndrome, Alzheimer dementia, hyperlipidemia, hypothyroidism who was transferred to the ED after he was found in the facility agitated, holding his chest and complaining if chest pain. His guardian reported that patient has been having flu symptoms of cold for couple of days and at baseline he is not recognizing people and is not conversaant  Assessment & Plan:   Principal Problem:   CAP (community acquired pneumonia) Active Problems:   Hypothyroidism   Hyperlipidemia   DOWN SYNDROME   Dementia due to Alzheimer's disease   Chest pain   CAP and Influenza A infection CXR showed lingular pneumonia, PCR positive for influenza A. Started on antibiotics, will continue, Tamiflu also for 5 days. Patient needs repeat XRay in 3-4 weeks to assure resolution of PNA as recommended by radiology to r/o lung mass. Supportive management with bronchodilators, mucolytics and oxygen as needed. Continue current respiratory regimen.  Chest pain EKG has without new changes First Troponin is negative Continue to cycle cardiac enzymes and monitor on telemetry Doubt chest pain of cardiac etiology, likely secondary to his pneumonia.  Down syndrome and advanced dementia At baseline patient is not recognizing his guardian, non-conversant monitor for safety to prevent falls and provide supportive care Continue Namenda, Aricept, Seroquel, Depakote  Hyperlipidemia Continue Zocor  Hypothyroidism TSH is 7.023, patient is on 50 g of levothyroxine now increased to 75.  Macrocytosis -MCV is 107, B12 normal, folate pending,  TSH is 7.023 I will increase the Synthroid   DVT prophylaxis: Subcutaneous heparin Code Status: Full Code Family Communication:  Disposition Plan:  Diet: Diet regular Room service appropriate? Yes; Fluid consistency: Thin  Consultants:   None  Procedures:   None  Antimicrobials:   Rocephin and Zithromax   Objective: Vitals:   02/01/17 2114 02/02/17 0505 02/02/17 0921 02/02/17 1303  BP: 100/64 (!) 107/51  101/63  Pulse: 84 64  60  Resp: 15 15  14   Temp: 99.6 F (37.6 C) 98.7 F (37.1 C)  98.9 F (37.2 C)  TempSrc: Axillary Axillary  Oral  SpO2: 100% 99% 98% 90%    Intake/Output Summary (Last 24 hours) at 02/02/17 1316 Last data filed at 02/02/17 1303  Gross per 24 hour  Intake             2595 ml  Output             3500 ml  Net             -905 ml   There were no vitals filed for this visit.  Examination: General exam: Appears calm and comfortable  Respiratory system: Clear to auscultation. Respiratory effort normal. Cardiovascular system: S1 & S2 heard, RRR. No JVD, murmurs, rubs, gallops or clicks. No pedal edema. Gastrointestinal system: Abdomen is nondistended, soft and nontender. No organomegaly or masses felt. Normal bowel sounds heard. Central nervous system: Alert and oriented. No focal neurological deficits. Extremities: Symmetric 5 x 5 power. Skin: No rashes, lesions or ulcers Psychiatry: Judgement and insight appear normal. Mood & affect appropriate.   Data Reviewed: I have personally reviewed following labs and imaging studies  CBC:  Recent Labs  Lab 01/31/17 0930 01/31/17 1258 02/01/17 0003 02/02/17 0543  WBC 3.7* 2.8* 3.0* 5.0  NEUTROABS  --   --  1.8  --   HGB 14.7 13.5 12.7* 12.8*  HCT 43.3 39.5 36.7* 37.8*  MCV 107.2* 107.0* 106.1* 107.7*  PLT 126* 109* 108* 97*   Basic Metabolic Panel:  Recent Labs Lab 01/31/17 0930 01/31/17 1258 02/01/17 0003 02/02/17 0543  NA 138  --  137 138  K 4.0  --  3.4* 4.4  CL 100*  --  103  106  CO2 29  --  26 25  GLUCOSE 115*  --  115* 93  BUN 12  --  9 7  CREATININE 1.13 0.99 0.81 0.83  CALCIUM 8.6*  --  7.7* 7.9*   GFR: CrCl cannot be calculated (Unknown ideal weight.). Liver Function Tests:  Recent Labs Lab 02/02/17 0908  AST 52*  ALT 28  ALKPHOS 50  BILITOT 0.5  PROT 4.9*  ALBUMIN 2.1*   No results for input(s): LIPASE, AMYLASE in the last 168 hours. No results for input(s): AMMONIA in the last 168 hours. Coagulation Profile: No results for input(s): INR, PROTIME in the last 168 hours. Cardiac Enzymes:  Recent Labs Lab 01/31/17 1258 01/31/17 1857 02/01/17 0003  TROPONINI <0.03 <0.03 <0.03   BNP (last 3 results) No results for input(s): PROBNP in the last 8760 hours. HbA1C: No results for input(s): HGBA1C in the last 72 hours. CBG: No results for input(s): GLUCAP in the last 168 hours. Lipid Profile: No results for input(s): CHOL, HDL, LDLCALC, TRIG, CHOLHDL, LDLDIRECT in the last 72 hours. Thyroid Function Tests:  Recent Labs  02/02/17 0908  TSH 7.023*   Anemia Panel:  Recent Labs  02/02/17 0908  VITAMINB12 1,806*   Urine analysis: No results found for: COLORURINE, APPEARANCEUR, LABSPEC, PHURINE, GLUCOSEU, HGBUR, BILIRUBINUR, KETONESUR, PROTEINUR, UROBILINOGEN, NITRITE, LEUKOCYTESUR Sepsis Labs: @LABRCNTIP (procalcitonin:4,lacticidven:4)  ) Recent Results (from the past 240 hour(s))  Culture, blood (routine x 2) Call MD if unable to obtain prior to antibiotics being given     Status: None (Preliminary result)   Collection Time: 01/31/17  1:00 PM  Result Value Ref Range Status   Specimen Description BLOOD RIGHT ANTECUBITAL  Final   Special Requests   Final    BOTTLES DRAWN AEROBIC AND ANAEROBIC 10CC AER 5CC ANA   Culture NO GROWTH 1 DAY  Final   Report Status PENDING  Incomplete  Culture, blood (routine x 2) Call MD if unable to obtain prior to antibiotics being given     Status: None (Preliminary result)   Collection Time:  01/31/17  1:14 PM  Result Value Ref Range Status   Specimen Description BLOOD RIGHT HAND  Final   Special Requests BOTTLES DRAWN AEROBIC ONLY 10CC  Final   Culture NO GROWTH 1 DAY  Final   Report Status PENDING  Incomplete  MRSA PCR Screening     Status: None   Collection Time: 02/02/17 10:26 AM  Result Value Ref Range Status   MRSA by PCR NEGATIVE NEGATIVE Final    Comment:        The GeneXpert MRSA Assay (FDA approved for NASAL specimens only), is one component of a comprehensive MRSA colonization surveillance program. It is not intended to diagnose MRSA infection nor to guide or monitor treatment for MRSA infections.      Invalid input(s): PROCALCITONIN, LACTICACIDVEN   Radiology Studies: No results found.      Scheduled Meds: . aspirin EC  81 mg  Oral Daily  . azithromycin  500 mg Oral Daily  . cefTRIAXone (ROCEPHIN)  IV  1 g Intravenous Q24H  . chlorhexidine  15 mL Mouth/Throat BID  . divalproex  750 mg Oral Daily  . donepezil  10 mg Oral QHS  . guaiFENesin  600 mg Oral BID  . heparin  5,000 Units Subcutaneous Q8H  . ipratropium-albuterol  3 mL Nebulization TID  . latanoprost  1 drop Both Eyes QHS  . levothyroxine  50 mcg Oral QAC breakfast  . memantine  10 mg Oral BID  . multivitamin with minerals  1 tablet Oral Daily  . oseltamivir  75 mg Oral BID  . QUEtiapine  50 mg Oral QHS  . sertraline  50 mg Oral Daily  . simvastatin  20 mg Oral q1800   Continuous Infusions: . sodium chloride 75 mL/hr at 02/02/17 0745     LOS: 1 day    Time spent: 35 minutes    Keva Darty A, MD [facility] Pager [phone]  If 7PM-7AM, please contact night-coverage [email or web address] Password [email or web address] 02/02/2017, 1:16 PM

## 2017-02-03 LAB — CBC
HCT: 38.3 % — ABNORMAL LOW (ref 39.0–52.0)
HEMOGLOBIN: 12.9 g/dL — AB (ref 13.0–17.0)
MCH: 35.9 pg — AB (ref 26.0–34.0)
MCHC: 33.7 g/dL (ref 30.0–36.0)
MCV: 106.7 fL — AB (ref 78.0–100.0)
Platelets: 113 10*3/uL — ABNORMAL LOW (ref 150–400)
RBC: 3.59 MIL/uL — AB (ref 4.22–5.81)
RDW: 15.3 % (ref 11.5–15.5)
WBC: 6.3 10*3/uL (ref 4.0–10.5)

## 2017-02-03 LAB — BASIC METABOLIC PANEL
ANION GAP: 7 (ref 5–15)
BUN: 10 mg/dL (ref 6–20)
CHLORIDE: 102 mmol/L (ref 101–111)
CO2: 28 mmol/L (ref 22–32)
Calcium: 7.8 mg/dL — ABNORMAL LOW (ref 8.9–10.3)
Creatinine, Ser: 1.07 mg/dL (ref 0.61–1.24)
GFR calc non Af Amer: >60 mL/min (ref >60)
Glucose, Bld: 94 mg/dL (ref 65–99)
POTASSIUM: 3.8 mmol/L (ref 3.5–5.1)
Sodium: 137 mmol/L (ref 135–145)

## 2017-02-03 MED ORDER — IPRATROPIUM-ALBUTEROL 0.5-2.5 (3) MG/3ML IN SOLN: 3.0000 mL | RESPIRATORY_TRACT | Status: DC | PRN

## 2017-02-03 NOTE — Evaluation (Addendum)
Occupational Therapy Evaluation Patient Details Name: Travis Sampson MRN: [id number] DOB: 08/16/1956 Today's Date: 02/03/2017    History of Present Illness Travis Sampson is a 61 y.o. male with medical history significant of Down's syndrome, Alzheimer dementia, hyperlipidemia, hypothyroidism who was transferred to the ED after he was found in the facility agitated, holding his chest and complaining of chest pain.   Clinical Impression   PTA, pt was living in an assisted living facility and was able to complete functional ambulation during ADL. He currently requires maximum assistance with self-feeding and grooming tasks, total assistance with LB ADL, and max assist +2 for stand-pivot toilet transfers. He was able to maintain static sitting balance for ADL for approximately 30 seconds at a time with very close supervision. Pt was able to follow simple commands inconsistently during familiar ADL with maximum tactile and visual cues and did verbalize short phrases at times. Pt demonstrates significant decline from PLOF and would benefit from SNF placement for continued rehabilitation prior to returning to ALF in order to maximize independence with ADL. OT will continue to follow while admitted with a focus on self-feeding, grooming, and toilet transfers.    Follow Up Recommendations  SNF    Equipment Recommendations  Other (comment) (TBD at next venue of care)    Recommendations for Other Services       Precautions / Restrictions Precautions Precautions: Fall (Droplet) Restrictions Weight Bearing Restrictions: No      Mobility Bed Mobility Overal bed mobility: Needs Assistance Bed Mobility: Sit to Supine     Supine to sit: +2 for physical assistance;Total assist Sit to supine: +2 for physical assistance;Total assist   General bed mobility comments: Total assist for all aspects of bed mobility  Transfers Overall transfer level: Needs assistance Equipment used: 2 person hand  held assist Transfers: Stand Pivot Transfers;Sit to/from Stand Sit to Stand: Max assist;+2 physical assistance Stand pivot transfers: Max assist;+2 physical assistance Squat pivot transfers: +2 physical assistance;Total assist     General transfer comment: Max assist to rise into standing and to progress to EOB from recliner.    Balance Overall balance assessment: Needs assistance Sitting-balance support: Feet supported;Bilateral upper extremity supported Sitting balance-Leahy Scale: Poor Sitting balance - Comments: Able to briefly maintain sitting balance with B UE support with supervision (approximately 30 seconds at a time). At times with posterior lean but able to recover with min assist. Postural control: Posterior lean Standing balance support: Bilateral upper extremity supported;During functional activity Standing balance-Leahy Scale: Poor Standing balance comment: Requiring max assist +2 in standing.                            ADL Overall ADL's : Needs assistance/impaired Eating/Feeding: Maximal assistance;Sitting Eating/Feeding Details (indicate cue type and reason): Able to feed self with spoon with max assist for grasp and hand to mouth. Maximum multimodal cues to continue chewing and swallow. Grooming: Wash/dry face;Maximal assistance;Sitting Grooming Details (indicate cue type and reason): Max hand over hand assistance to bring washcloth to face to wash face with pt wiping cheeck once therapist assisted hand to face movement. Therapist completing rest of task in order to clean food off of corner of mouth. Upper Body Bathing: Sitting;Maximal assistance;Total assistance   Lower Body Bathing: Total assistance;Sit to/from stand Lower Body Bathing Details (indicate cue type and reason): Max +2 sit<>stand Upper Body Dressing : Sitting;Maximal assistance;Total assistance   Lower Body Dressing: Total assistance;Sit to/from stand Lower  Body Dressing Details (indicate  cue type and reason): Max +2 sit<>stand Toilet Transfer: Maximal assistance;+2 for physical assistance;Stand-pivot Toilet Transfer Details (indicate cue type and reason): Simulated from recliner to bed.  Toileting- Clothing Manipulation and Hygiene: Total assistance;Sit to/from stand;+2 for physical assistance Toileting - Clothing Manipulation Details (indicate cue type and reason): Max assist +2 sit<>stand.       General ADL Comments: Pt more alert this session as compared to reports from PT. Able to inconsistently follow simple ADL commands with maximum multimodal cues and responded better to tactile and visual cues as well as hand over hand assist to initiate familiar tasks. Once standing, pt able to maintain with max assist +2 and take 2-3 steps while pivoting to sit at EOB.     Vision Patient Visual Report:  (Pt unable to report) Additional Comments: Will continue to assess. Pt did make eye contact with therapist.      Perception     Praxis      Pertinent Vitals/Pain Pain Assessment: Faces Faces Pain Scale: No hurt     Hand Dominance     Extremity/Trunk Assessment Upper Extremity Assessment Upper Extremity Assessment: Generalized weakness;Difficult to assess due to impaired cognition   Lower Extremity Assessment Lower Extremity Assessment: Generalized weakness;Difficult to assess due to impaired cognition       Communication Communication Communication: Expressive difficulties   Cognition Arousal/Alertness: Awake/alert (Asleep on arrival but alert during session) Behavior During Therapy: Flat affect (Did smile at times ) Overall Cognitive Status: No family/caregiver present to determine baseline cognitive functioning                 General Comments: Able to follow some simple commands with maximum multimodal cues.   General Comments  O2 sats 87-88% on 3L O2 in chair at end of session; RN notified and ok'd increasing O2 to 4L    Exercises       Shoulder  Instructions      Home Living Family/patient expects to be discharged to:: Unsure                                 Additional Comments: Per RN, pt is from a Memory Care unit at Assisted Living      Prior Functioning/Environment Level of Independence: Needs assistance  Gait / Transfers Assistance Needed: Per RN, pt was ambulatory at his ALF; not sure if or what assistive device he uses ADL's / Homemaking Assistance Needed: Unsure of assist levels for ADL tasks but was living at ALF PTA. Communication / Swallowing Assistance Needed: Largely non-verbal Comments: Majority of information from RN and PT. Will continue to gather information from caregiver when available.        OT Problem List: Decreased strength;Decreased range of motion;Decreased activity tolerance;Impaired balance (sitting and/or standing);Decreased cognition;Pain      OT Treatment/Interventions: Self-care/ADL training;Therapeutic exercise;Energy conservation;Therapeutic activities;Cognitive remediation/compensation;Visual/perceptual remediation/compensation;Patient/family education;Balance training    OT Goals(Current goals can be found in the care plan section) Acute Rehab OT Goals Patient Stated Goal: Unable to state OT Goal Formulation: With patient Time For Goal Achievement: 02/17/17 Potential to Achieve Goals: Good ADL Goals Pt Will Perform Eating: with min guard assist;sitting;with caregiver independent in assisting Pt Will Perform Grooming: with min guard assist;with caregiver independent in assisting;sitting Pt Will Perform Upper Body Dressing: with min assist;sitting;with caregiver independent in assisting Pt Will Perform Lower Body Dressing: with min assist;sit to/from stand;with caregiver independent in  assisting Pt Will Transfer to Toilet: with min assist;bedside commode;stand pivot transfer (BSC over toilet) Pt Will Perform Toileting - Clothing Manipulation and hygiene: with min assist;sit  to/from stand Additional ADL Goal #1: Pt will complete bed mobility with overall min assist as a precursor to ADL participation.  OT Frequency: Min 2X/week   Barriers to D/C:            Co-evaluation              End of Session Equipment Utilized During Treatment: Gait belt;Other (comment) (4LO2) Nurse Communication: Mobility status  Activity Tolerance: Patient tolerated treatment well Patient left: in bed;with call bell/phone within reach;with bed alarm set  OT Visit Diagnosis: Unsteadiness on feet (R26.81);Muscle weakness (generalized) (M62.81)                ADL either performed or assessed with clinical judgement  Time: 1401-1427 OT Time Calculation (min): 26 min Charges:  OT General Charges $OT Visit: 1 Procedure OT Evaluation $OT Eval Moderate Complexity: 1 Procedure OT Treatments $Self Care/Home Management : 8-22 mins G-Codes:     Travis Sectionharity A Meshulem Onorato, MS OTR/L  Pager: [phone]   Travis Sampson 02/03/2017, 4:38 PM

## 2017-02-03 NOTE — Progress Notes (Signed)
PROGRESS NOTE  Travis Sampson  ZOX:[id number] DOB: 20-Dec-1955 DOA: 01/31/2017 PCP: Pincus Sanes, MD Outpatient Specialists:  Subjective: Appears to be better mentally, answers simple questions. Wall ambulate today hopefully can be discharged in a.m.  Brief Narrative:  Travis Sampson is a 61 y.o. male with medical history significant of Down's syndrome, Alzheimer dementia, hyperlipidemia, hypothyroidism who was transferred to the ED after he was found in the facility agitated, holding his chest and complaining if chest pain. His guardian reported that patient has been having flu symptoms of cold for couple of days and at baseline he is not recognizing people and is not conversaant  Assessment & Plan:   Principal Problem:   CAP (community acquired pneumonia) Active Problems:   Hypothyroidism   Hyperlipidemia   DOWN SYNDROME   Dementia due to Alzheimer's disease   Chest pain   CAP and Influenza A infection CXR showed lingular pneumonia, PCR positive for influenza A. Started on antibiotics, will continue, Tamiflu also for 5 days. Patient needs repeat XRay in 3-4 weeks to assure resolution of PNA as recommended by radiology to r/o lung mass. Supportive management with bronchodilators, mucolytics and oxygen as needed. Continue current respiratory regimen. Ambulate  Chest pain EKG has without new changes First Troponin is negative Continue to cycle cardiac enzymes and monitor on telemetry Doubt chest pain of cardiac etiology, likely secondary to his pneumonia.  Down syndrome and advanced dementia At baseline patient is not recognizing his guardian, non-conversant monitor for safety to prevent falls and provide supportive care Continue Namenda, Aricept, Seroquel, Depakote  Hyperlipidemia Continue Zocor  Hypothyroidism TSH is 7.023, patient is on 50 g of levothyroxine now increased to 75.  Macrocytosis -MCV is 107, B12 and folate both normal, TSH is 7.023 I will  increase the Synthroid   DVT prophylaxis: Subcutaneous heparin Code Status: Full Code Family Communication:  Disposition Plan:  Diet: Diet regular Room service appropriate? Yes; Fluid consistency: Thin  Consultants:   None  Procedures:   None  Antimicrobials:   Rocephin and Zithromax   Objective: Vitals:   02/02/17 1303 02/02/17 1955 02/02/17 2313 02/03/17 0456  BP: 101/63  (!) 149/123 124/66  Pulse: 60 64 76 74  Resp: 14 16 17 18   Temp: 98.9 F (37.2 C)  99.1 F (37.3 C) 98.3 F (36.8 C)  TempSrc: Oral  Axillary Axillary  SpO2: 90%  90% 94%    Intake/Output Summary (Last 24 hours) at 02/03/17 0948 Last data filed at 02/03/17 0500  Gross per 24 hour  Intake              500 ml  Output             2575 ml  Net            -2075 ml   There were no vitals filed for this visit.  Examination: General exam: Appears calm and comfortable  Respiratory system: Clear to auscultation. Respiratory effort normal. Cardiovascular system: S1 & S2 heard, RRR. No JVD, murmurs, rubs, gallops or clicks. No pedal edema. Gastrointestinal system: Abdomen is nondistended, soft and nontender. No organomegaly or masses felt. Normal bowel sounds heard. Central nervous system: Alert and oriented. No focal neurological deficits. Extremities: Symmetric 5 x 5 power. Skin: No rashes, lesions or ulcers Psychiatry: Judgement and insight appear normal. Mood & affect appropriate.   Data Reviewed: I have personally reviewed following labs and imaging studies  CBC:  Recent Labs Lab 01/31/17 0930 01/31/17 1258 02/01/17 0003  02/02/17 0543 02/03/17 0435  WBC 3.7* 2.8* 3.0* 5.0 6.3  NEUTROABS  --   --  1.8  --   --   HGB 14.7 13.5 12.7* 12.8* 12.9*  HCT 43.3 39.5 36.7* 37.8* 38.3*  MCV 107.2* 107.0* 106.1* 107.7* 106.7*  PLT 126* 109* 108* 97* 113*   Basic Metabolic Panel:  Recent Labs Lab 01/31/17 0930 01/31/17 1258 02/01/17 0003 02/02/17 0543 02/03/17 0435  NA 138  --  137 138  137  K 4.0  --  3.4* 4.4 3.8  CL 100*  --  103 106 102  CO2 29  --  26 25 28   GLUCOSE 115*  --  115* 93 94  BUN 12  --  9 7 10   CREATININE 1.13 0.99 0.81 0.83 1.07  CALCIUM 8.6*  --  7.7* 7.9* 7.8*   GFR: CrCl cannot be calculated (Unknown ideal weight.). Liver Function Tests:  Recent Labs Lab 02/02/17 0908  AST 52*  ALT 28  ALKPHOS 50  BILITOT 0.5  PROT 4.9*  ALBUMIN 2.1*   No results for input(s): LIPASE, AMYLASE in the last 168 hours. No results for input(s): AMMONIA in the last 168 hours. Coagulation Profile: No results for input(s): INR, PROTIME in the last 168 hours. Cardiac Enzymes:  Recent Labs Lab 01/31/17 1258 01/31/17 1857 02/01/17 0003  TROPONINI <0.03 <0.03 <0.03   BNP (last 3 results) No results for input(s): PROBNP in the last 8760 hours. HbA1C: No results for input(s): HGBA1C in the last 72 hours. CBG: No results for input(s): GLUCAP in the last 168 hours. Lipid Profile: No results for input(s): CHOL, HDL, LDLCALC, TRIG, CHOLHDL, LDLDIRECT in the last 72 hours. Thyroid Function Tests:  Recent Labs  02/02/17 0908  TSH 7.023*   Anemia Panel:  Recent Labs  02/02/17 0908  VITAMINB12 1,806*  FOLATE 44.1   Urine analysis: No results found for: COLORURINE, APPEARANCEUR, LABSPEC, PHURINE, GLUCOSEU, HGBUR, BILIRUBINUR, KETONESUR, PROTEINUR, UROBILINOGEN, NITRITE, LEUKOCYTESUR Sepsis Labs: @LABRCNTIP (procalcitonin:4,lacticidven:4)  ) Recent Results (from the past 240 hour(s))  Culture, blood (routine x 2) Call MD if unable to obtain prior to antibiotics being given     Status: None (Preliminary result)   Collection Time: 01/31/17  1:00 PM  Result Value Ref Range Status   Specimen Description BLOOD RIGHT ANTECUBITAL  Final   Special Requests   Final    BOTTLES DRAWN AEROBIC AND ANAEROBIC 10CC AER 5CC ANA   Culture NO GROWTH 2 DAYS  Final   Report Status PENDING  Incomplete  Culture, blood (routine x 2) Call MD if unable to obtain prior to  antibiotics being given     Status: None (Preliminary result)   Collection Time: 01/31/17  1:14 PM  Result Value Ref Range Status   Specimen Description BLOOD RIGHT HAND  Final   Special Requests BOTTLES DRAWN AEROBIC ONLY 10CC  Final   Culture NO GROWTH 2 DAYS  Final   Report Status PENDING  Incomplete  MRSA PCR Screening     Status: None   Collection Time: 02/02/17 10:26 AM  Result Value Ref Range Status   MRSA by PCR NEGATIVE NEGATIVE Final    Comment:        The GeneXpert MRSA Assay (FDA approved for NASAL specimens only), is one component of a comprehensive MRSA colonization surveillance program. It is not intended to diagnose MRSA infection nor to guide or monitor treatment for MRSA infections.      Invalid input(s): PROCALCITONIN, LACTICACIDVEN   Radiology Studies:  No results found.      Scheduled Meds: . aspirin EC  81 mg Oral Daily  . azithromycin  500 mg Oral Daily  . cefTRIAXone (ROCEPHIN)  IV  1 g Intravenous Q24H  . chlorhexidine  15 mL Mouth/Throat BID  . divalproex  750 mg Oral Daily  . donepezil  10 mg Oral QHS  . guaiFENesin  600 mg Oral BID  . heparin  5,000 Units Subcutaneous Q8H  . ipratropium-albuterol  3 mL Nebulization TID  . latanoprost  1 drop Both Eyes QHS  . levothyroxine  75 mcg Oral QAC breakfast  . memantine  10 mg Oral BID  . multivitamin with minerals  1 tablet Oral Daily  . oseltamivir  75 mg Oral BID  . QUEtiapine  50 mg Oral QHS  . sertraline  50 mg Oral Daily  . simvastatin  20 mg Oral q1800   Continuous Infusions:    LOS: 2 days    Time spent: 35 minutes    Donia Yokum A, MD [facility] Pager [phone]  If 7PM-7AM, please contact night-coverage [email or web address] Password [email or web address] 02/03/2017, 9:48 AM

## 2017-02-03 NOTE — Evaluation (Signed)
Physical Therapy Evaluation Patient Details Name: Travis Sampson MRN: [id number] DOB: 04-01-1956 Today's Date: 02/03/2017   History of Present Illness  Travis Sampson is a 61 y.o. male with medical history significant of Down's syndrome, Alzheimer dementia, hyperlipidemia, hypothyroidism who was transferred to the ED after he was found in the facility agitated, holding his chest and complaining of chest pain.  Clinical Impression   Pt admitted with above diagnosis. Pt currently with functional limitations due to the deficits listed below (see PT Problem List). Requiring total assist for all aspects of mobility this session, which I understand is very different from his baseline; at this point, we must consider SNF level of care for rehab to maximize independence and safety with mobility prior to return to ALF; Pt will benefit from skilled PT to increase their independence and safety with mobility to allow discharge to the venue listed below.       Follow Up Recommendations SNF    Equipment Recommendations  Rolling walker with 5" wheels;3in1 (PT)    Recommendations for Other Services       Precautions / Restrictions Precautions Precautions: Fall (Droplet)      Mobility  Bed Mobility Overal bed mobility: Needs Assistance Bed Mobility: Supine to Sit     Supine to sit: +2 for physical assistance;Total assist     General bed mobility comments: Total assist for all aspects of bed mobility  Transfers Overall transfer level: Needs assistance Equipment used: 2 person hand held assist Transfers: Squat Pivot Transfers     Squat pivot transfers: +2 physical assistance;Total assist     General transfer comment: Bilateral support at shoulder girdles and gait belt; knees blocked for stability; Pt was still sleepy; minimal effort from pt during transfer, if any  Ambulation/Gait                Stairs            Wheelchair Mobility    Modified Rankin (Stroke  Patients Only)       Balance Overall balance assessment: Needs assistance Sitting-balance support: Feet supported Sitting balance-Leahy Scale: Zero Sitting balance - Comments: max to total assist to maintain sitting Postural control: Right lateral lean;Posterior lean                                   Pertinent Vitals/Pain Pain Assessment: Faces Faces Pain Scale: No hurt    Home Living Family/patient expects to be discharged to:: Unsure                 Additional Comments: Per RN, pt is from a Memory Care unit at Assisted Living    Prior Function Level of Independence: Needs assistance   Gait / Transfers Assistance Needed: Per RN, pt was ambulatory at his ALF; not sure if or what assistive device he uses           Hand Dominance        Extremity/Trunk Assessment   Upper Extremity Assessment Upper Extremity Assessment: Generalized weakness;Difficult to assess due to impaired cognition    Lower Extremity Assessment Lower Extremity Assessment: Generalized weakness;Difficult to assess due to impaired cognition       Communication   Communication: Expressive difficulties  Cognition Arousal/Alertness: Lethargic (Per RN, he is typically hard to wake up in the morning) Behavior During Therapy: Flat affect Overall Cognitive Status: No family/caregiver present to determine baseline cognitive functioning  General Comments General comments (skin integrity, edema, etc.): O2 sats 87-88% on 3L O2 in chair at end of session; RN notified and ok'd increasing O2 to 4L    Exercises     Assessment/Plan    PT Assessment Patient needs continued PT services  PT Problem List Decreased strength;Decreased range of motion;Decreased activity tolerance;Decreased balance;Decreased mobility;Decreased coordination;Decreased cognition;Decreased knowledge of use of DME;Decreased safety awareness;Decreased knowledge of  precautions;Cardiopulmonary status limiting activity       PT Treatment Interventions DME instruction;Gait training;Functional mobility training;Therapeutic activities;Therapeutic exercise;Balance training;Patient/family education    PT Goals (Current goals can be found in the Care Plan section)  Acute Rehab PT Goals Patient Stated Goal: Unable to state PT Goal Formulation: With patient Time For Goal Achievement: 02/17/17 Potential to Achieve Goals: Fair    Frequency Min 3X/week   Barriers to discharge        Co-evaluation               End of Session Equipment Utilized During Treatment: Gait belt Activity Tolerance: Patient limited by lethargy Patient left: in chair;with call bell/phone within reach;with chair alarm set Nurse Communication: Mobility status;Need for lift equipment PT Visit Diagnosis: Muscle weakness (generalized) (M62.81)         Time: [id number] PT Time Calculation (min) (ACUTE ONLY): 20 min   Charges:   PT Evaluation $PT Eval Moderate Complexity: 1 Procedure     PT G Codes:         Levi AlandHolly H Houda Brau 02/03/2017, 1:45 PM  Van ClinesHolly Chellsie Gomer, PT  Acute Rehabilitation Services Pager [phone] Office [phone]

## 2017-02-04 ENCOUNTER — Inpatient Hospital Stay (HOSPITAL_COMMUNITY): Payer: Medicare Other

## 2017-02-04 LAB — CBC
HCT: 38.5 % — ABNORMAL LOW (ref 39.0–52.0)
Hemoglobin: 13.1 g/dL (ref 13.0–17.0)
MCH: 36.6 pg — AB (ref 26.0–34.0)
MCHC: 34 g/dL (ref 30.0–36.0)
MCV: 107.5 fL — ABNORMAL HIGH (ref 78.0–100.0)
PLATELETS: 121 10*3/uL — AB (ref 150–400)
RBC: 3.58 MIL/uL — ABNORMAL LOW (ref 4.22–5.81)
RDW: 15.4 % (ref 11.5–15.5)
WBC: 4.4 10*3/uL (ref 4.0–10.5)

## 2017-02-04 LAB — BLOOD GAS, ARTERIAL
ACID-BASE EXCESS: 5.7 mmol/L — AB (ref 0.0–2.0)
BICARBONATE: 29.4 mmol/L — AB (ref 20.0–28.0)
Drawn by: 275531
O2 Content: 6 L/min
O2 SAT: 91.5 %
PCO2 ART: 40.3 mmHg (ref 32.0–48.0)
PO2 ART: 61.3 mmHg — AB (ref 83.0–108.0)
Patient temperature: 98.6
pH, Arterial: 7.476 — ABNORMAL HIGH (ref 7.350–7.450)

## 2017-02-04 LAB — BASIC METABOLIC PANEL
Anion gap: 8 (ref 5–15)
BUN: 12 mg/dL (ref 6–20)
CALCIUM: 8.1 mg/dL — AB (ref 8.9–10.3)
CHLORIDE: 103 mmol/L (ref 101–111)
CO2: 28 mmol/L (ref 22–32)
CREATININE: 0.98 mg/dL (ref 0.61–1.24)
GFR calc Af Amer: >60 mL/min (ref >60)
GFR calc non Af Amer: >60 mL/min (ref >60)
Glucose, Bld: 78 mg/dL (ref 65–99)
Potassium: 3.7 mmol/L (ref 3.5–5.1)
SODIUM: 139 mmol/L (ref 135–145)

## 2017-02-04 MED ORDER — FUROSEMIDE 10 MG/ML IJ SOLN
40.0000 mg | Freq: Once | INTRAMUSCULAR | Status: AC
  Administered 2017-02-04: 40 mg via INTRAVENOUS
  Filled 2017-02-04: qty 4

## 2017-02-04 MED ORDER — WHITE PETROLATUM GEL
Status: AC
  Administered 2017-02-04: 07:00:00
  Filled 2017-02-04: qty 1

## 2017-02-04 MED ORDER — FUROSEMIDE 10 MG/ML IJ SOLN
40.0000 mg | Freq: Once | INTRAMUSCULAR | Status: AC
  Administered 2017-02-04: 40 mg via INTRAVENOUS

## 2017-02-04 MED ORDER — FUROSEMIDE 10 MG/ML IJ SOLN
INTRAMUSCULAR | Status: AC
  Filled 2017-02-04: qty 4

## 2017-02-04 MED ORDER — POTASSIUM CHLORIDE CRYS ER 20 MEQ PO TBCR
40.0000 meq | EXTENDED_RELEASE_TABLET | Freq: Once | ORAL | Status: AC
  Administered 2017-02-04: 40 meq via ORAL
  Filled 2017-02-04: qty 2

## 2017-02-04 NOTE — Consult Note (Signed)
[facility] CM Primary Care Navigator  02/04/2017  [place] [birth year] [id number]   Met with patient at the bedside to identify possible discharge needs but he was not very communicative. Patient has history of Down's syndrome and Alzheimer's dementia. MD note stated that patient was found in the facility agitated, holding his chest and complaining of chest pain that had led to this admission.  Spoke to his legal guardian Miguel Rota) on the phone and she reports that patient had been a resident at [facility] at [place]. Legal guardian endorses Dr. Billey Gosling with [facility] as the primary care provider for patient.    Patient had been using facility pharmacy [facility]) for his medications and being dispensed by staff to him.  Facility or legal guardian (if available) provides transportation to his doctors' appointments.  Guardian states that patient care is being provided by [facility] facility.   Discharge plan is skilled nursing facility (SNF) for continued rehabilitation prior to returning to [place] facility per therapists and physician.  Patient's guardian voiced understanding to call primary care provider's office after discharge from skilled nursing facility, for a post discharge follow-up appointment within a week or sooner if needs arise. Patient letter (with PCP's contact number) was provided as a reminder.  Legal guardian denies any other needs or concerns of patient at this time.  For additional questions please contact:  Edwena Felty A. Aleksa Collinsworth, BSN, RN-BC [facility] PRIMARY CARE Navigator Cell: [phone]

## 2017-02-04 NOTE — Progress Notes (Signed)
Patient in bed resting.  Gave 10 am meds and current meds due.  Patient does not seem to be in any pain or discomfort.

## 2017-02-04 NOTE — Progress Notes (Addendum)
[facility] 2030 [facility]'s [facility] bend sent with patient.

## 2017-02-04 NOTE — Progress Notes (Signed)
PROGRESS NOTE  Rutherford GuysWilliam W Sampson  ZOX:[id number] DOB: 05/10/1956 DOA: 01/31/2017 PCP: Pincus SanesStacy J Burns, MD Outpatient Specialists:  Subjective: No complaints this morning, still has rhonchi bilaterally. Continues to be hypoxic, currently on 6 L of oxygen, CXR repeated mole right-sided effusion.  Brief Narrative:  [name] is a 61 y.o. male with medical history significant of Down's syndrome, Alzheimer dementia, hyperlipidemia, hypothyroidism who was transferred to the ED after he was found in the facility agitated, holding his chest and complaining if chest pain. His guardian reported that patient has been having flu symptoms of cold for couple of days and at baseline he is not recognizing people and is not conversaant  Assessment & Plan:   Principal Problem:   CAP (community acquired pneumonia) Active Problems:   Hypothyroidism   Hyperlipidemia   DOWN SYNDROME   Dementia due to Alzheimer's disease   Chest pain   Acute respiratory failure with hypoxia -On admission patient required 2-3 L of oxygen, this is likely secondary to pneumonia and influenza infection. -Increased oxygen requirements, currently on 6 L of oxygen. -CXR repeated showed slight pleural effusion, will diurese with loop diuretics.  CAP and Influenza A infection CXR showed lingular pneumonia, PCR positive for influenza A. Started on antibiotics, will continue, Tamiflu also for 5 days. Patient needs repeat XRay in 3-4 weeks to assure resolution of PNA as recommended by radiology to r/o lung mass. Supportive management with bronchodilators, mucolytics and oxygen as needed. Incentive spirometry and flutter valve.  Chest pain EKG has without new changes First Troponin is negative Continue to cycle cardiac enzymes and monitor on telemetry Doubt chest pain of cardiac etiology, likely secondary to his pneumonia.  Down syndrome and advanced dementia At baseline patient is not recognizing his guardian,  non-conversant monitor for safety to prevent falls and provide supportive care Continue Namenda, Aricept, Seroquel, Depakote  Hyperlipidemia Continue Zocor  Hypothyroidism TSH is 7.023, patient is on 50 g of levothyroxine now increased to 75.  Macrocytosis -MCV is 107, B12 and folate both normal, TSH is 7.023 I will increase the Synthroid   DVT prophylaxis: Subcutaneous heparin Code Status: Full Code Family Communication:  Disposition Plan:  Diet: Diet regular Room service appropriate? Yes; Fluid consistency: Thin  Consultants:   None  Procedures:   None  Antimicrobials:   Rocephin and Zithromax   Objective: Vitals:   02/04/17 0745 02/04/17 0750 02/04/17 1000 02/04/17 1020  BP:      Pulse:      Resp:      Temp:      TempSrc:      SpO2: (!) 82% 91% (!) 86% 94%    Intake/Output Summary (Last 24 hours) at 02/04/17 1248 Last data filed at 02/04/17 1100  Gross per 24 hour  Intake              350 ml  Output             1225 ml  Net             -875 ml   There were no vitals filed for this visit.  Examination: General exam: Appears calm and comfortable  Respiratory system: Clear to auscultation. Respiratory effort normal. Cardiovascular system: S1 & S2 heard, RRR. No JVD, murmurs, rubs, gallops or clicks. No pedal edema. Gastrointestinal system: Abdomen is nondistended, soft and nontender. No organomegaly or masses felt. Normal bowel sounds heard. Central nervous system: Alert and oriented. No focal neurological deficits. Extremities: Symmetric 5 x 5  power. Skin: No rashes, lesions or ulcers Psychiatry: Judgement and insight appear normal. Mood & affect appropriate.   Data Reviewed: I have personally reviewed following labs and imaging studies  CBC:  Recent Labs Lab 01/31/17 1258 02/01/17 0003 02/02/17 0543 02/03/17 0435 02/04/17 0413  WBC 2.8* 3.0* 5.0 6.3 4.4  NEUTROABS  --  1.8  --   --   --   HGB 13.5 12.7* 12.8* 12.9* 13.1  HCT 39.5 36.7*  37.8* 38.3* 38.5*  MCV 107.0* 106.1* 107.7* 106.7* 107.5*  PLT 109* 108* 97* 113* 121*   Basic Metabolic Panel:  Recent Labs Lab 01/31/17 0930 01/31/17 1258 02/01/17 0003 02/02/17 0543 02/03/17 0435 02/04/17 0413  NA 138  --  137 138 137 139  K 4.0  --  3.4* 4.4 3.8 3.7  CL 100*  --  103 106 102 103  CO2 29  --  26 25 28 28   GLUCOSE 115*  --  115* 93 94 78  BUN 12  --  9 7 10 12   CREATININE 1.13 0.99 0.81 0.83 1.07 0.98  CALCIUM 8.6*  --  7.7* 7.9* 7.8* 8.1*   GFR: CrCl cannot be calculated (Unknown ideal weight.). Liver Function Tests:  Recent Labs Lab 02/02/17 0908  AST 52*  ALT 28  ALKPHOS 50  BILITOT 0.5  PROT 4.9*  ALBUMIN 2.1*   No results for input(s): LIPASE, AMYLASE in the last 168 hours. No results for input(s): AMMONIA in the last 168 hours. Coagulation Profile: No results for input(s): INR, PROTIME in the last 168 hours. Cardiac Enzymes:  Recent Labs Lab 01/31/17 1258 01/31/17 1857 02/01/17 0003  TROPONINI <0.03 <0.03 <0.03   BNP (last 3 results) No results for input(s): PROBNP in the last 8760 hours. HbA1C: No results for input(s): HGBA1C in the last 72 hours. CBG: No results for input(s): GLUCAP in the last 168 hours. Lipid Profile: No results for input(s): CHOL, HDL, LDLCALC, TRIG, CHOLHDL, LDLDIRECT in the last 72 hours. Thyroid Function Tests:  Recent Labs  02/02/17 0908  TSH 7.023*   Anemia Panel:  Recent Labs  02/02/17 0908  VITAMINB12 1,806*  FOLATE 44.1   Urine analysis: No results found for: COLORURINE, APPEARANCEUR, LABSPEC, PHURINE, GLUCOSEU, HGBUR, BILIRUBINUR, KETONESUR, PROTEINUR, UROBILINOGEN, NITRITE, LEUKOCYTESUR Sepsis Labs: @LABRCNTIP (procalcitonin:4,lacticidven:4)  ) Recent Results (from the past 240 hour(s))  Culture, blood (routine x 2) Call MD if unable to obtain prior to antibiotics being given     Status: None (Preliminary result)   Collection Time: 01/31/17  1:00 PM  Result Value Ref Range Status     Specimen Description BLOOD RIGHT ANTECUBITAL  Final   Special Requests   Final    BOTTLES DRAWN AEROBIC AND ANAEROBIC 10CC AER 5CC ANA   Culture NO GROWTH 3 DAYS  Final   Report Status PENDING  Incomplete  Culture, blood (routine x 2) Call MD if unable to obtain prior to antibiotics being given     Status: None (Preliminary result)   Collection Time: 01/31/17  1:14 PM  Result Value Ref Range Status   Specimen Description BLOOD RIGHT HAND  Final   Special Requests BOTTLES DRAWN AEROBIC ONLY 10CC  Final   Culture NO GROWTH 3 DAYS  Final   Report Status PENDING  Incomplete  MRSA PCR Screening     Status: None   Collection Time: 02/02/17 10:26 AM  Result Value Ref Range Status   MRSA by PCR NEGATIVE NEGATIVE Final    Comment:  The GeneXpert MRSA Assay (FDA approved for NASAL specimens only), is one component of a comprehensive MRSA colonization surveillance program. It is not intended to diagnose MRSA infection nor to guide or monitor treatment for MRSA infections.      Invalid input(s): PROCALCITONIN, LACTICACIDVEN   Radiology Studies: Dg Chest Port 1 View  Result Date: 02/04/2017 CLINICAL DATA:  Hypoxia EXAM: PORTABLE CHEST 1 VIEW COMPARISON:  01/31/2017 chest radiograph. FINDINGS: Right rotated chest radiograph. Stable cardiomediastinal silhouette with mild cardiomegaly and aortic atherosclerosis. No pneumothorax. Small right pleural effusion appears new. No left pleural effusion. No pulmonary edema. Patchy opacities at the right greater than left lung bases, not appreciably changed. IMPRESSION: 1. No appreciable change in patchy bibasilar lung opacities, right greater the left, which could represent atelectasis, pneumonia and/or aspiration. 2. New small right pleural effusion. 3. Mild cardiomegaly without pulmonary edema. 4. Aortic atherosclerosis. Electronically Signed   By: Delbert Phenix M.D.   On: 02/04/2017 08:01        Scheduled Meds: . aspirin EC  81 mg Oral  Daily  . cefTRIAXone (ROCEPHIN)  IV  1 g Intravenous Q24H  . chlorhexidine  15 mL Mouth/Throat BID  . divalproex  750 mg Oral Daily  . donepezil  10 mg Oral QHS  . guaiFENesin  600 mg Oral BID  . heparin  5,000 Units Subcutaneous Q8H  . latanoprost  1 drop Both Eyes QHS  . levothyroxine  75 mcg Oral QAC breakfast  . memantine  10 mg Oral BID  . multivitamin with minerals  1 tablet Oral Daily  . oseltamivir  75 mg Oral BID  . potassium chloride  40 mEq Oral Once  . QUEtiapine  50 mg Oral QHS  . sertraline  50 mg Oral Daily  . simvastatin  20 mg Oral q1800   Continuous Infusions:    LOS: 3 days    Time spent: 35 minutes    Trystin Hargrove A, MD [facility] Pager [phone]  If 7PM-7AM, please contact night-coverage [email or web address] Password [facility] 02/04/2017, 12:48 PM

## 2017-02-04 NOTE — Significant Event (Signed)
Subjective: Stil hypoxic, Currently 6 Lof O2 and sat in the high 80s.  Obective: Vitals:   02/04/17 0441 02/04/17 1300  BP: (!) 104/59 (!) 156/110  Pulse: 82 (!) 120  Resp: 18   Temp: 100 F (37.8 C) 98.1 F (36.7 C)   A/P Transfer to SDU may need Venti mask to keep sat around 92%. Consider BiPAP if not improving with Venturi mask. Continue Rocephin, Zithromax, Tamiflu and lasix as needed  Clint LippsMutaz A Taevion Sikora Pager: [phone] 02/04/2017, 4:33 PM

## 2017-02-05 ENCOUNTER — Inpatient Hospital Stay (HOSPITAL_COMMUNITY): Payer: Medicare Other

## 2017-02-05 DIAGNOSIS — I509 Heart failure, unspecified: Secondary | ICD-10-CM

## 2017-02-05 LAB — CULTURE, BLOOD (ROUTINE X 2)
CULTURE: NO GROWTH
Culture: NO GROWTH

## 2017-02-05 LAB — ECHOCARDIOGRAM LIMITED
E decel time: 289 msec
Height: 60 in
MV Dec: 289
MVPG: 3 mmHg
MVPKAVEL: 92.2 m/s
MVPKEVEL: 86.5 m/s
Reg peak vel: 212 cm/s
TRMAXVEL: 212 cm/s
Weight: 2370.39 oz

## 2017-02-05 LAB — BASIC METABOLIC PANEL
Anion gap: 7 (ref 5–15)
BUN: 14 mg/dL (ref 6–20)
CO2: 32 mmol/L (ref 22–32)
Calcium: 8.2 mg/dL — ABNORMAL LOW (ref 8.9–10.3)
Chloride: 101 mmol/L (ref 101–111)
Creatinine, Ser: 1.07 mg/dL (ref 0.61–1.24)
GFR calc Af Amer: >60 mL/min (ref >60)
GLUCOSE: 88 mg/dL (ref 65–99)
POTASSIUM: 3.9 mmol/L (ref 3.5–5.1)
Sodium: 140 mmol/L (ref 135–145)

## 2017-02-05 LAB — CBC
HEMATOCRIT: 41 % (ref 39.0–52.0)
Hemoglobin: 13.8 g/dL (ref 13.0–17.0)
MCH: 36.2 pg — AB (ref 26.0–34.0)
MCHC: 33.7 g/dL (ref 30.0–36.0)
MCV: 107.6 fL — AB (ref 78.0–100.0)
Platelets: 147 10*3/uL — ABNORMAL LOW (ref 150–400)
RBC: 3.81 MIL/uL — ABNORMAL LOW (ref 4.22–5.81)
RDW: 15.1 % (ref 11.5–15.5)
WBC: 4.7 10*3/uL (ref 4.0–10.5)

## 2017-02-05 LAB — PHOSPHORUS: PHOSPHORUS: 3.6 mg/dL (ref 2.5–4.6)

## 2017-02-05 LAB — MAGNESIUM: Magnesium: 2.1 mg/dL (ref 1.7–2.4)

## 2017-02-05 LAB — BRAIN NATRIURETIC PEPTIDE: B Natriuretic Peptide: 59.5 pg/mL (ref 0.0–100.0)

## 2017-02-05 MED ORDER — FUROSEMIDE 10 MG/ML IJ SOLN
40.0000 mg | Freq: Two times a day (BID) | INTRAMUSCULAR | Status: DC
  Administered 2017-02-05 – 2017-02-07 (×5): 40 mg via INTRAVENOUS
  Filled 2017-02-05 (×5): qty 4

## 2017-02-05 MED ORDER — PERFLUTREN LIPID MICROSPHERE
1.0000 mL | INTRAVENOUS | Status: AC | PRN
  Administered 2017-02-05: 3 mL via INTRAVENOUS
  Filled 2017-02-05: qty 10

## 2017-02-05 MED ORDER — PERFLUTREN LIPID MICROSPHERE
INTRAVENOUS | Status: AC
  Administered 2017-02-05: 3 mL via INTRAVENOUS
  Filled 2017-02-05: qty 10

## 2017-02-05 MED ORDER — ORAL CARE MOUTH RINSE
15.0000 mL | Freq: Two times a day (BID) | OROMUCOSAL | Status: DC
  Administered 2017-02-05 – 2017-02-10 (×7): 15 mL via OROMUCOSAL

## 2017-02-05 MED ORDER — CHLORHEXIDINE GLUCONATE 0.12 % MT SOLN
15.0000 mL | Freq: Two times a day (BID) | OROMUCOSAL | Status: DC
  Administered 2017-02-05 – 2017-02-11 (×11): 15 mL via OROMUCOSAL
  Filled 2017-02-05 (×10): qty 15

## 2017-02-05 NOTE — Clinical Social Work Note (Signed)
Clinical Social Work Assessment  Patient Details  Name: Travis Sampson MRN: [id number] Date of Birth: 09/21/1956  Date of referral:  02/05/17               Reason for consult:  Facility Placement                Permission sought to share information with:  Facility Medical sales representativeContact Representative, Family Supports Permission granted to share information::  Yes, Verbal Permission Granted  Name::     Ursula AlertRobin Stiles, legal guardian; [phone]  Agency::  SNF[facility]  Relationship::  Legal Guardian  Contact Information:     Housing/Transportation Living arrangements for the past 2 months:  Assisted Living Facility Source of Information:  Guardian Patient Interpreter Needed:  None Criminal Activity/Legal Involvement Pertinent to Current Situation/Hospitalization:  No - Comment as needed Significant Relationships:  None Lives with:  Facility Resident Do you feel safe going back to the place where you live?  No Need for family participation in patient care:  Yes (Comment)  Care giving concerns:  CSW received consult for possible SNF placement at time of discharge. CSW spoke with patient's legal guardian, Travis Sampson, regarding PT recommendation of SNF placement at time of discharge. Travis Sampson reported that [facility] and they are currently unable to care for patient given patient's current physical needs and fall risk. Robin expressed understanding of PT recommendation and is agreeable to SNF placement at time of discharge. CSW to continue to follow and assist with discharge planning needs.   Social Worker assessment / plan:  CSW spoke with patient's guardian concerning [facility] before [facility].  Employment status:  Disabled (Comment on whether or not currently receiving Disability) Insurance information:  Medicare PT Recommendations:  Skilled Nursing Facility Information / Referral to community resources:  Skilled Nursing Facility  Patient/Family's Response to  care:  Patient's guardian recognizes need for rehab before returning home and [place].   Patient/Family's Understanding of and Emotional Response to Diagnosis, Current Treatment, and Prognosis:  Patient/family is realistic regarding therapy needs and expressed being hopeful for SNF placement. Patient's guardian expressed understanding of CSW role and discharge process. Travis Sampson expressed concern that patient will be able to get back to his baseline and return to ALF. She also had questions about patient's insurance. No further questions/concerns about plan or treatment.    Emotional Assessment Appearance:  Appears stated age Attitude/Demeanor/Rapport:  Unable to Assess Affect (typically observed):  Unable to Assess Orientation:  Oriented to Self Alcohol / Substance use:  Not Applicable Psych involvement (Current and /or in the community):  No (Comment)  Discharge Needs  Concerns to be addressed:  Care Coordination Readmission within the last 30 days:  No Current discharge risk:  Physical Impairment Barriers to Discharge:  Continued Medical Work up   [employer] S Connie Hilgert, LCSWA 02/05/2017, 3:52 PM

## 2017-02-05 NOTE — Progress Notes (Signed)
PROGRESS NOTE  Travis Sampson  ZOX:[id number] DOB: Jun 03, 1956 DOA: 01/31/2017 PCP: Pincus Sanes, MD Outpatient Specialists:  Subjective: Transfer to stepdown overnight, BiPAP this morning we'll try to wean off of BiPAP. Continue current antibiotics, started on aggressive diuresis with Lasix despite his low BNP.  Brief Narrative:  Travis Sampson is a 61 y.o. male with medical history significant of Down's syndrome, Alzheimer dementia, hyperlipidemia, hypothyroidism who was transferred to the ED after he was found in the facility agitated, holding his chest and complaining if chest pain. His guardian reported that patient has been having flu symptoms of cold for couple of days and at baseline he is not recognizing people and is not conversaant  Assessment & Plan:   Principal Problem:   CAP (community acquired pneumonia) Active Problems:   Hypothyroidism   Hyperlipidemia   DOWN SYNDROME   Dementia due to Alzheimer's disease   Chest pain   Acute respiratory failure with hypoxia -On admission patient required 2-3 L of oxygen, this is likely secondary to pneumonia and influenza infection. -Increased oxygen requirements consistently, required BiPAP -CXR repeated this morning showed right pleural effusion and worsening of pneumonia. -Broaden antibiotics and continue diuresis.  CAP and Influenza A infection -CXR showed lingular pneumonia, PCR positive for influenza A. -Started on antibiotics, will continue, Tamiflu also for 5 days. -Patient needs repeat XRay in 3-4 weeks to assure resolution of PNA as recommended by radiology to r/o lung mass. -Supportive management with bronchodilators, mucolytics and oxygen as needed. -On Rocephin and Zithromax, although CXR showed worsening of pneumonia no fever or leukocytosis. -Continue current antibiotics, started on diuresis repeat CXR in a.m.  Chest pain -EKG has without new changes -3 sets of troponin negative. -This is resolved  likely his chest pain is secondary to pneumonia.  Down syndrome and advanced dementia At baseline patient is not recognizing his guardian, non-conversant monitor for safety to prevent falls and provide supportive care Continue Namenda, Aricept, Seroquel, Depakote  Hyperlipidemia Continue Zocor  Hypothyroidism TSH is 7.023, patient is on 50 g of levothyroxine now increased to 75.  Macrocytosis -MCV is 107, B12 and folate both normal, TSH is 7.023 I will increase the Synthroid   DVT prophylaxis: Subcutaneous heparin Code Status: Full Code Family Communication:  Disposition Plan: Transferred to stepdown on 3/5 afternoon Diet: Diet regular Room service appropriate? Yes; Fluid consistency: Thin  Consultants:   None  Procedures:   None  Antimicrobials:   Rocephin and Zithromax   Objective: Vitals:   02/05/17 0700 02/05/17 0800 02/05/17 0815 02/05/17 0900  BP: 112/88   108/87  Pulse:   (!) 59   Resp: 16 18 14 12   Temp: 98.9 F (37.2 C)     TempSrc: Axillary     SpO2: 100% 98% 95% 98%  Weight:      Height:        Intake/Output Summary (Last 24 hours) at 02/05/17 1026 Last data filed at 02/05/17 0900  Gross per 24 hour  Intake              120 ml  Output             2425 ml  Net            -2305 ml   Filed Weights   02/04/17 2144  Weight: 67.2 kg (148 lb 2.4 oz)    Examination: General exam: Appears calm and comfortable  Respiratory system: Clear to auscultation. Respiratory effort normal. Cardiovascular system: S1 & S2  heard, RRR. No JVD, murmurs, rubs, gallops or clicks. No pedal edema. Gastrointestinal system: Abdomen is nondistended, soft and nontender. No organomegaly or masses felt. Normal bowel sounds heard. Central nervous system: Alert and oriented. No focal neurological deficits. Extremities: Symmetric 5 x 5 power. Skin: No rashes, lesions or ulcers Psychiatry: Judgement and insight appear normal. Mood & affect appropriate.   Data Reviewed:  I have personally reviewed following labs and imaging studies  CBC:  Recent Labs Lab 02/01/17 0003 02/02/17 0543 02/03/17 0435 02/04/17 0413 02/05/17 0258  WBC 3.0* 5.0 6.3 4.4 4.7  NEUTROABS 1.8  --   --   --   --   HGB 12.7* 12.8* 12.9* 13.1 13.8  HCT 36.7* 37.8* 38.3* 38.5* 41.0  MCV 106.1* 107.7* 106.7* 107.5* 107.6*  PLT 108* 97* 113* 121* 147*   Basic Metabolic Panel:  Recent Labs Lab 02/01/17 0003 02/02/17 0543 02/03/17 0435 02/04/17 0413 02/05/17 0258  NA 137 138 137 139 140  K 3.4* 4.4 3.8 3.7 3.9  CL 103 106 102 103 101  CO2 26 25 28 28  32  GLUCOSE 115* 93 94 78 88  BUN 9 7 10 12 14   CREATININE 0.81 0.83 1.07 0.98 1.07  CALCIUM 7.7* 7.9* 7.8* 8.1* 8.2*  MG  --   --   --   --  2.1  PHOS  --   --   --   --  3.6   GFR: Estimated Creatinine Clearance: 57.6 mL/min (by C-G formula based on SCr of 1.07 mg/dL). Liver Function Tests:  Recent Labs Lab 02/02/17 0908  AST 52*  ALT 28  ALKPHOS 50  BILITOT 0.5  PROT 4.9*  ALBUMIN 2.1*   No results for input(s): LIPASE, AMYLASE in the last 168 hours. No results for input(s): AMMONIA in the last 168 hours. Coagulation Profile: No results for input(s): INR, PROTIME in the last 168 hours. Cardiac Enzymes:  Recent Labs Lab 01/31/17 1258 01/31/17 1857 02/01/17 0003  TROPONINI <0.03 <0.03 <0.03   BNP (last 3 results) No results for input(s): PROBNP in the last 8760 hours. HbA1C: No results for input(s): HGBA1C in the last 72 hours. CBG: No results for input(s): GLUCAP in the last 168 hours. Lipid Profile: No results for input(s): CHOL, HDL, LDLCALC, TRIG, CHOLHDL, LDLDIRECT in the last 72 hours. Thyroid Function Tests: No results for input(s): TSH, T4TOTAL, FREET4, T3FREE, THYROIDAB in the last 72 hours. Anemia Panel: No results for input(s): VITAMINB12, FOLATE, FERRITIN, TIBC, IRON, RETICCTPCT in the last 72 hours. Urine analysis: No results found for: COLORURINE, APPEARANCEUR, LABSPEC, PHURINE,  GLUCOSEU, HGBUR, BILIRUBINUR, KETONESUR, PROTEINUR, UROBILINOGEN, NITRITE, LEUKOCYTESUR Sepsis Labs: @LABRCNTIP (procalcitonin:4,lacticidven:4)  ) Recent Results (from the past 240 hour(s))  Culture, blood (routine x 2) Call MD if unable to obtain prior to antibiotics being given     Status: None (Preliminary result)   Collection Time: 01/31/17  1:00 PM  Result Value Ref Range Status   Specimen Description BLOOD RIGHT ANTECUBITAL  Final   Special Requests   Final    BOTTLES DRAWN AEROBIC AND ANAEROBIC 10CC AER 5CC ANA   Culture NO GROWTH 4 DAYS  Final   Report Status PENDING  Incomplete  Culture, blood (routine x 2) Call MD if unable to obtain prior to antibiotics being given     Status: None (Preliminary result)   Collection Time: 01/31/17  1:14 PM  Result Value Ref Range Status   Specimen Description BLOOD RIGHT HAND  Final   Special Requests BOTTLES DRAWN  AEROBIC ONLY 10CC  Final   Culture NO GROWTH 4 DAYS  Final   Report Status PENDING  Incomplete  MRSA PCR Screening     Status: None   Collection Time: 02/02/17 10:26 AM  Result Value Ref Range Status   MRSA by PCR NEGATIVE NEGATIVE Final    Comment:        The GeneXpert MRSA Assay (FDA approved for NASAL specimens only), is one component of a comprehensive MRSA colonization surveillance program. It is not intended to diagnose MRSA infection nor to guide or monitor treatment for MRSA infections.      Invalid input(s): PROCALCITONIN, LACTICACIDVEN   Radiology Studies: Dg Chest Port 1 View  Result Date: 02/05/2017 CLINICAL DATA:  60 year old with acute hypoxia. EXAM: PORTABLE CHEST 1 VIEW COMPARISON:  02/04/2017, 01/31/2017 and earlier, including CTA chest 07/05/2013. FINDINGS: Suboptimal inspiration. Cardiac silhouette mildly to moderately enlarged for technique, unchanged. New patchy opacities in the right upper lobe and at the left lung base since yesterday. Persistent patchy airspace opacities at the right lung base.  Small right pleural effusion and possible small left pleural effusion, unchanged. Pulmonary vascularity normal. IMPRESSION: 1. Progressive pneumonia since yesterday, with new involvement of the right upper lobe a into the left lung base and stable involvement of the right lung base. 2. Stable small right pleural effusion a possible small left pleural effusion. Electronically Signed   By: Hulan Saas M.D.   On: 02/05/2017 07:46   Dg Chest Port 1 View  Result Date: 02/04/2017 CLINICAL DATA:  Hypoxia EXAM: PORTABLE CHEST 1 VIEW COMPARISON:  01/31/2017 chest radiograph. FINDINGS: Right rotated chest radiograph. Stable cardiomediastinal silhouette with mild cardiomegaly and aortic atherosclerosis. No pneumothorax. Small right pleural effusion appears new. No left pleural effusion. No pulmonary edema. Patchy opacities at the right greater than left lung bases, not appreciably changed. IMPRESSION: 1. No appreciable change in patchy bibasilar lung opacities, right greater the left, which could represent atelectasis, pneumonia and/or aspiration. 2. New small right pleural effusion. 3. Mild cardiomegaly without pulmonary edema. 4. Aortic atherosclerosis. Electronically Signed   By: Delbert Phenix M.D.   On: 02/04/2017 08:01        Scheduled Meds: . aspirin EC  81 mg Oral Daily  . cefTRIAXone (ROCEPHIN)  IV  1 g Intravenous Q24H  . chlorhexidine  15 mL Mouth/Throat BID  . chlorhexidine  15 mL Mouth Rinse BID  . divalproex  750 mg Oral Daily  . donepezil  10 mg Oral QHS  . furosemide  40 mg Intravenous BID  . guaiFENesin  600 mg Oral BID  . heparin  5,000 Units Subcutaneous Q8H  . latanoprost  1 drop Both Eyes QHS  . levothyroxine  75 mcg Oral QAC breakfast  . mouth rinse  15 mL Mouth Rinse q12n4p  . memantine  10 mg Oral BID  . multivitamin with minerals  1 tablet Oral Daily  . oseltamivir  75 mg Oral BID  . QUEtiapine  50 mg Oral QHS  . sertraline  50 mg Oral Daily  . simvastatin  20 mg Oral  q1800   Continuous Infusions:    LOS: 4 days    Time spent: 35 minutes    Corinna Burkman A, MD [facility] Pager [phone]  If 7PM-7AM, please contact night-coverage [email or web address] Password [email or web address] 02/05/2017, 10:26 AM

## 2017-02-05 NOTE — NC FL2 (Signed)
[place] MEDICAID FL2 LEVEL OF CARE SCREENING TOOL     IDENTIFICATION  Patient Name: Travis Sampson Birthdate: 11/15/1956 Sex: male Admission Date (Current Location): 01/31/2017  [facility] [place]   Facility and Address:  [facility], [address], [place], [place] [address]      Provider Number: [id number]  Attending Physician Name and Address:  Clydia LlanoMutaz Elmahi, MD  Relative Name and Phone Number:       Current Level of Care: Hospital Recommended Level of Care: Skilled Nursing Facility Prior Approval Number:    Date Approved/Denied:   PASRR Number:    Discharge Plan: SNF    Current Diagnoses: Patient Active Problem List   Diagnosis Date Noted  . CAP (community acquired pneumonia) 01/31/2017  . Dementia due to Alzheimer's disease 01/31/2017  . Chest pain 01/31/2017  . Unsteady gait 12/04/2016  . Bilateral leg edema 12/04/2016  . Raynaud's phenomenon 08/31/2014  . DOE (dyspnea on exertion) 07/30/2014  . Bradycardia 07/30/2014  . Macrocytosis without anemia 01/03/2014  . Syncope 07/14/2013  . Peripheral neuropathy (HCC) 07/14/2013  . Memory loss 09/29/2012  . Abnormal ECG 10/16/2011  . Hyperglycemia 07/01/2009  . Hypothyroidism 06/16/2007  . Hyperlipidemia 06/16/2007  . DOWN SYNDROME 06/16/2007    Orientation RESPIRATION BLADDER Height & Weight      (Developmentaly delayed but appropriate)  O2 (Nasal cannula 4L) Incontinent Weight: 67.2 kg (148 lb 2.4 oz) Height:  5' (152.4 cm)  BEHAVIORAL SYMPTOMS/MOOD NEUROLOGICAL BOWEL NUTRITION STATUS   (No behaviors)   Continent Diet (Please see DC Summary)  AMBULATORY STATUS COMMUNICATION OF NEEDS Skin   Extensive Assist Verbally Normal                       Personal Care Assistance Level of Assistance  Bathing, Feeding, Dressing Bathing Assistance: Maximum assistance Feeding assistance: Limited assistance Dressing Assistance: Limited assistance      Functional Limitations Info             SPECIAL CARE FACTORS FREQUENCY  PT (By licensed PT), OT (By licensed OT)     PT Frequency: 5x/week OT Frequency: 3x/week            Contractures      Additional Factors Info  Code Status, Allergies, Psychotropic, Isolation Precautions Code Status Info: Full Allergies Info: NKA Psychotropic Info: Seroquel; Zoloft   Isolation Precautions Info: Droplet     Current Medications (02/05/2017):  This is the current hospital active medication list Current Facility-Administered Medications  Medication Dose Route Frequency Provider Last Rate Last Dose  . acetaminophen (TYLENOL) tablet 650 mg  650 mg Oral Q6H PRN Isaiah BlakesMarina S Kyazimova, PA-C       Or  . acetaminophen (TYLENOL) suppository 650 mg  650 mg Rectal Q6H PRN Isaiah BlakesMarina S Kyazimova, PA-C      . aspirin EC tablet 81 mg  81 mg Oral Daily Isaiah BlakesMarina S Kyazimova, PA-C   81 mg at 02/05/17 [id number]  . cefTRIAXone (ROCEPHIN) 1 g in dextrose 5 % 50 mL IVPB  1 g Intravenous Q24H Isaiah BlakesMarina S Kyazimova, PA-C   1 g at 02/05/17 [id number]  . chlorhexidine (PERIDEX) 0.12 % solution 15 mL  15 mL Mouth/Throat BID Isaiah BlakesMarina S Kyazimova, PA-C   15 mL at 02/04/17 2246  . chlorhexidine (PERIDEX) 0.12 % solution 15 mL  15 mL Mouth Rinse BID Clydia LlanoMutaz Elmahi, MD   15 mL at 02/05/17 0922  . divalproex (DEPAKOTE ER) 24  hr tablet 750 mg  750 mg Oral Daily Isaiah Blakes, PA-C   750 mg at 02/05/17 1610  . donepezil (ARICEPT) tablet 10 mg  10 mg Oral QHS Isaiah Blakes, PA-C   10 mg at 02/05/17 0009  . furosemide (LASIX) injection 40 mg  40 mg Intravenous BID Clydia Llano, MD   40 mg at 02/05/17 0800  . guaiFENesin (MUCINEX) 12 hr tablet 600 mg  600 mg Oral BID Clydia Llano, MD   600 mg at 02/05/17 0921  . guaiFENesin-dextromethorphan (ROBITUSSIN DM) 100-10 MG/5ML syrup 5 mL  5 mL Oral PRN Isaiah Blakes, PA-C      . heparin injection 5,000 Units  5,000 Units Subcutaneous [address] [place], PA-C   5,000 Units at 02/05/17 1500   . ipratropium-albuterol (DUONEB) 0.5-2.5 (3) MG/3ML nebulizer solution 3 mL  3 mL Nebulization Q4H PRN Clydia Llano, MD      . latanoprost (XALATAN) 0.005 % ophthalmic solution 1 drop  1 drop Both Eyes QHS Isaiah Blakes, PA-C   1 drop at 02/04/17 2241  . levothyroxine (SYNTHROID, LEVOTHROID) tablet 75 mcg  75 mcg Oral QAC breakfast Clydia Llano, MD   75 mcg at 02/05/17 0800  . MEDLINE mouth rinse  15 mL Mouth Rinse q12n4p Clydia Llano, MD   15 mL at 02/05/17 1540  . memantine (NAMENDA) tablet 10 mg  10 mg Oral BID Isaiah Blakes, PA-C   10 mg at 02/05/17 9604  . multivitamin with minerals tablet 1 tablet  1 tablet Oral Daily Isaiah Blakes, PA-C   1 tablet at 02/05/17 5409  . ondansetron (ZOFRAN) tablet 4 mg  4 mg Oral Q6H PRN Isaiah Blakes, PA-C       Or  . ondansetron [facility]) injection 4 mg  4 mg Intravenous Q6H PRN Isaiah Blakes, PA-C      . oseltamivir (TAMIFLU) capsule 75 mg  75 mg Oral BID Isaiah Blakes, PA-C   75 mg at 02/05/17 8119  . polyethylene glycol (MIRALAX / GLYCOLAX) packet 17 g  17 g Oral Daily PRN Isaiah Blakes, PA-C      . QUEtiapine (SEROQUEL) tablet 50 mg  50 mg Oral QHS Isaiah Blakes, PA-C   50 mg at 02/04/17 2246  . sertraline (ZOLOFT) tablet 50 mg  50 mg Oral Daily Isaiah Blakes, PA-C   50 mg at 02/05/17 1478  . simvastatin (ZOCOR) tablet 20 mg  20 mg Oral q1800 Isaiah Blakes, PA-C   20 mg at 02/04/17 1801     Discharge Medications: Please see discharge summary for a list of discharge medications.  Relevant Imaging Results:  Relevant Lab Results:   Additional Information SSN: [id number] [place], [place]

## 2017-02-05 NOTE — Progress Notes (Signed)
Pt taken off BIPAP and placed on 4L nasal cannula.  PT tolerating well at this time.  RT will continue to monitor.

## 2017-02-05 NOTE — Progress Notes (Signed)
  Echocardiogram 2D Echocardiogram Limited with Definity has been performed.  Travis Sampson, Travis Sampson 02/05/2017, 11:42 AM

## 2017-02-05 NOTE — Progress Notes (Signed)
Occupational Therapy Treatment Patient Details Name: Travis PleasureWilliam W Sampson MRN: [id number] DOB: 06/14/1956 Today's Date: 02/05/2017    History of present illness Pt is a 61 y.o. male with medical history significant of Down's syndrome, Alzheimer dementia, who was transferred to the ED after he was found in the facility agitated, holding his chest and complaining of chest pain. Pt found to have pneumonia with respiratory failure. Pt with incr O2 requirements and transferred to step down.   OT comments  Pt continues to require extensive assist with mobility and ADLs. OT will continue to follow acutely  Follow Up Recommendations  SNF    Equipment Recommendations  None recommended by OT    Recommendations for Other Services      Precautions / Restrictions Precautions Precautions: Fall Restrictions Weight Bearing Restrictions: No       Mobility Bed Mobility Overal bed mobility: Needs Assistance Bed Mobility: Sit to Supine;Supine to Sit     Supine to sit: +2 for physical assistance;Total assist Sit to supine: +2 for physical assistance;Total assist   General bed mobility comments: Assist with trunk and LEs  Transfers Overall transfer level: Needs assistance               General transfer comment: NT    Balance Overall balance assessment: Needs assistance Sitting-balance support: Feet supported Sitting balance-Leahy Scale: Poor Sitting balance - Comments: Pt sat EOB x 20 minutes with total to min A. Primarily pt max assist due to heavy posterior and lt lean. Attempted having pt place forearms on bedside table to encourage anterior lean with intermittent improvement. Participated in OT activities while sitting EOB. At times pt able to maintain with min A. Postural control: Posterior lean;Right lateral lean                         ADL   Eating/Feeding: Sitting;Moderate assistance Eating/Feeding Details (indicate cue type and reason): hand over hand assist to  initiate pickinf up bacon x 2, stabbing eggs with fork and bringing to mouth Grooming: Wash/dry face;Maximal assistance;Sitting Grooming Details (indicate cue type and reason): Max hand over hand assistance to bring washcloth to face to wash face with pt wiping cheeck once therapist assisted hand to face movement. Therapist completing rest of task in order to clean food off of corner of mouth.         Upper Body Dressing : Sitting;Maximal assistance                                        Cognition   Behavior During Therapy: Flat affect Overall Cognitive Status: No family/caregiver present to determine baseline cognitive functioning                  General Comments: Followed some one step commands with multimodal cues and incr time.                  General Comments  pt pleasant    Pertinent Vitals/ Pain       Pain Assessment: Faces Faces Pain Scale: Hurts little more Pain Location: Lt hand (possibly due to IV) Pain Descriptors / Indicators: Grimacing Pain Intervention(s): Monitored during session  Home Living  ALF  Frequency  Min 2X/week        Progress Toward Goals  OT Goals(current goals can now be found in the care plan section)  Progress towards OT goals: OT to reassess next treatment  Acute Rehab OT Goals Patient Stated Goal: Unable to state  Plan Discharge plan remains appropriate    Co-evaluation      Reason for Co-Treatment: Complexity of the patient's impairments (multi-system involvement);For patient/therapist safety          End of Session        Activity Tolerance Patient tolerated treatment well   Patient Left in bed;with call bell/phone within reach   Nurse Communication  RN ok with therapy to see pt this a.m.    Functional Assessment Tool Used: AM-PAC 6 Clicks Daily Activity   Time: 1610-9604 OT Time Calculation (min): 32  min  Charges: OT G-codes **NOT FOR INPATIENT CLASS** Functional Assessment Tool Used: AM-PAC 6 Clicks Daily Activity OT General Charges $OT Visit: 1 Procedure OT Treatments $Self Care/Home Management : 8-22 mins     Galen Manila 02/05/2017, 11:51 AM

## 2017-02-05 NOTE — Progress Notes (Signed)
Physical Therapy Treatment Patient Details Name: Travis Sampson MRN: [id number] DOB: 14-Oct-1956 Today's Date: 02/05/2017    History of Present Illness Pt is a 61 y.o. male with medical history significant of Down's syndrome, Alzheimer dementia, who was transferred to the ED after he was found in the facility agitated, holding his chest and complaining of chest pain. Pt found to have pneumonia with respiratory failure. Pt with incr O2 requirements and transferred to step down.    PT Comments    Pt still requiring total assist for all mobility. Expect progress will be slow due to cognitive issues.   Follow Up Recommendations  SNF (unless ALF able to provide total care)     Equipment Recommendations  Other (comment) (To be assessed)    Recommendations for Other Services       Precautions / Restrictions Precautions Precautions: Fall Restrictions Weight Bearing Restrictions: No    Mobility  Bed Mobility Overal bed mobility: Needs Assistance Bed Mobility: Sit to Supine       Sit to supine: +2 for physical assistance;Total assist   General bed mobility comments: Assist to lower trunk and to bring feet back up into bed. Pt sitting on EOB with OT on arrival.  Transfers                    Ambulation/Gait                 Stairs            Wheelchair Mobility    Modified Rankin (Stroke Patients Only)       Balance Overall balance assessment: Needs assistance Sitting-balance support: Feet supported Sitting balance-Leahy Scale: Poor Sitting balance - Comments: Pt sat EOB x 20 minutes with total to min A. Primarily pt max assist due to heavy posterior and lt lean. Attempted having pt place forearms on bedside table to encourage anterior lean with intermittent improvement. Participated in OT activities while sitting EOB. At times pt able to maintain with min A. Postural control: Posterior lean;Right lateral lean                           Cognition Arousal/Alertness: Awake/alert Behavior During Therapy: Flat affect Overall Cognitive Status: No family/caregiver present to determine baseline cognitive functioning                 General Comments: Followed some one step commands with multimodal cues and incr time.    Exercises      General Comments General comments (skin integrity, edema, etc.): SpO2 and HR stable       Pertinent Vitals/Pain Pain Assessment: Faces Faces Pain Scale: Hurts little more Pain Location: Lt hand (possibly due to IV) Pain Descriptors / Indicators: Grimacing Pain Intervention(s): Monitored during session    Home Living                      Prior Function            PT Goals (current goals can now be found in the care plan section) Progress towards PT goals: Progressing toward goals    Frequency    Min 3X/week      PT Plan Current plan remains appropriate    Co-evaluation PT/OT/SLP Co-Evaluation/Treatment: Yes Reason for Co-Treatment: Complexity of the patient's impairments (multi-system involvement);For patient/therapist safety         End of Session   Activity Tolerance: Patient tolerated treatment well  Patient left: in bed;with call bell/phone within reach;with bed alarm set Nurse Communication: Mobility status;Need for lift equipment PT Visit Diagnosis: Muscle weakness (generalized) (M62.81)     Time: [id number] PT Time Calculation (min) (ACUTE ONLY): 20 min  Charges:  $Therapeutic Activity: 8-22 mins                    G Codes:       Angelina OkCary W Maycok 02/05/2017, 11:10 AM Skip Mayerary Oliva Montecalvo PT [phone]

## 2017-02-06 ENCOUNTER — Inpatient Hospital Stay (HOSPITAL_COMMUNITY): Payer: Medicare Other

## 2017-02-06 LAB — CBC
HEMATOCRIT: 43.8 % (ref 39.0–52.0)
Hemoglobin: 15.1 g/dL (ref 13.0–17.0)
MCH: 37 pg — AB (ref 26.0–34.0)
MCHC: 34.5 g/dL (ref 30.0–36.0)
MCV: 107.4 fL — AB (ref 78.0–100.0)
PLATELETS: 162 10*3/uL (ref 150–400)
RBC: 4.08 MIL/uL — ABNORMAL LOW (ref 4.22–5.81)
RDW: 15 % (ref 11.5–15.5)
WBC: 4.2 10*3/uL (ref 4.0–10.5)

## 2017-02-06 LAB — BASIC METABOLIC PANEL
Anion gap: 11 (ref 5–15)
BUN: 19 mg/dL (ref 6–20)
CHLORIDE: 101 mmol/L (ref 101–111)
CO2: 27 mmol/L (ref 22–32)
CREATININE: 0.96 mg/dL (ref 0.61–1.24)
Calcium: 8.1 mg/dL — ABNORMAL LOW (ref 8.9–10.3)
GFR calc Af Amer: >60 mL/min (ref >60)
GFR calc non Af Amer: >60 mL/min (ref >60)
Glucose, Bld: 88 mg/dL (ref 65–99)
POTASSIUM: 4.1 mmol/L (ref 3.5–5.1)
Sodium: 139 mmol/L (ref 135–145)

## 2017-02-06 NOTE — Progress Notes (Signed)
OT Cancellation Note  Patient Details Name: Travis PleasureWilliam W Sampson MRN: [id number] DOB: 01/13/1956   Cancelled Treatment:    Reason Eval/Treat Not Completed: Fatigue/lethargy limiting ability to participate. Pt not opening eyes, but grunting with OT speaking with pt to attempt to work with him. Will check back later as able or tomorrow  Galen ManilaSpencer, Stanislawa Gaffin Jeanette 02/06/2017, 10:33 AM

## 2017-02-06 NOTE — Progress Notes (Signed)
Attempted to feeding during lunch but pt is sleepy but arousable.continue to monitor.

## 2017-02-06 NOTE — Progress Notes (Signed)
PROGRESS NOTE        PATIENT DETAILS Name: Travis Sampson Age: 61 y.o. Sex: male Date of Birth: [birth year] Admit Date: 01/31/2017 Admitting Physician Haydee Salter, MD WUJ:WJXBJ Hetty Ely, MD  Brief Narrative: Patient is a 61 y.o. male Downs syndrome, dyslipidemia, hypothyroidism admitted on 3/1 with influenza/pneumonitis, hospital course complicated by worsening hypoxic respiratory failure requiring transfer to stepdown. Slowly improving with supportive measures. See below for further details.  Subjective: Lying comfortably in bed-does not appear to be in any distress.  Assessment/Plan: Acute hypoxemic respiratory failure: Secondary to influenza and probable bacterial pneumonia. Oxygen been slowly tapered down, not longer requiring BiPAP. Continue empiric antimicrobial therapy.  Influenza with pneumonitis: Has completed more than 5 days of empiric Tamiflu-discontinue today. Remains on empiric Rocephin-suspect do not need to broaden antibiotics regimen at this time as clinically improving.  Dyslipidemia: Continue statin  Hypothyroidism: Continue Synthroid-dosage increased to 75 g, recheck TSH in 3 months  History of down syndrome: Apparently current mental status is close to his baseline. Continue Namenda, Aricept, Seroquel and Depakote.  DVT Prophylaxis: Prophylactic Heparin   Code Status: Full code  Family Communication: None at bedside   Disposition Plan: Remain inpatient-but will plan on Home health vs SNF on discharge  Antimicrobial agents: Anti-infectives    Start     Dose/Rate Route Frequency Ordered Stop   02/02/17 1300  azithromycin (ZITHROMAX) tablet 500 mg     500 mg Oral Daily 02/02/17 1221 02/04/17 1126   02/01/17 1200  azithromycin (ZITHROMAX) 500 mg in dextrose 5 % 250 mL IVPB  Status:  Discontinued     500 mg 250 mL/hr over 60 Minutes Intravenous Every 24 hours 01/31/17 1230 02/02/17 1221   02/01/17 1030  cefTRIAXone  (ROCEPHIN) 1 g in dextrose 5 % 50 mL IVPB     1 g 100 mL/hr over 30 Minutes Intravenous Every 24 hours 01/31/17 1230 02/08/17 1029   01/31/17 1245  oseltamivir (TAMIFLU) capsule 75 mg     75 mg Oral 2 times daily 01/31/17 1230     01/31/17 1130  azithromycin (ZITHROMAX) 500 mg in dextrose 5 % 250 mL IVPB     500 mg 250 mL/hr over 60 Minutes Intravenous  Once 01/31/17 1119 01/31/17 1317   01/31/17 1130  oseltamivir (TAMIFLU) capsule 75 mg     75 mg Oral  Once 01/31/17 1128 01/31/17 1217   01/31/17 1045  cefTRIAXone (ROCEPHIN) 1 g in dextrose 5 % 50 mL IVPB     1 g 100 mL/hr over 30 Minutes Intravenous  Once 01/31/17 1042 01/31/17 1117   01/31/17 1045  azithromycin (ZITHROMAX) 500 mg in dextrose 5 % 250 mL IVPB  Status:  Discontinued     500 mg 250 mL/hr over 60 Minutes Intravenous  Once 01/31/17 1042 01/31/17 1059      Procedures: None  CONSULTS:  None  Time spent: 25 minutes-Greater than 50% of this time was spent in counseling, explanation of diagnosis, planning of further management, and coordination of care.  MEDICATIONS: Scheduled Meds: . aspirin EC  81 mg Oral Daily  . cefTRIAXone (ROCEPHIN)  IV  1 g Intravenous Q24H  . chlorhexidine  15 mL Mouth/Throat BID  . chlorhexidine  15 mL Mouth Rinse BID  . divalproex  750 mg Oral Daily  . donepezil  10 mg Oral QHS  .  furosemide  40 mg Intravenous BID  . guaiFENesin  600 mg Oral BID  . heparin  5,000 Units Subcutaneous Q8H  . latanoprost  1 drop Both Eyes QHS  . levothyroxine  75 mcg Oral QAC breakfast  . mouth rinse  15 mL Mouth Rinse q12n4p  . memantine  10 mg Oral BID  . multivitamin with minerals  1 tablet Oral Daily  . oseltamivir  75 mg Oral BID  . QUEtiapine  50 mg Oral QHS  . sertraline  50 mg Oral Daily  . simvastatin  20 mg Oral q1800   Continuous Infusions: PRN Meds:.acetaminophen **OR** acetaminophen, guaiFENesin-dextromethorphan, ipratropium-albuterol, ondansetron **OR** ondansetron (ZOFRAN) IV,  polyethylene glycol   PHYSICAL EXAM: Vital signs: Vitals:   02/06/17 0300 02/06/17 0700 02/06/17 1100 02/06/17 1200  BP: 117/68 102/90 111/74   Pulse:    62  Resp: 15 20 13    Temp: 97.7 F (36.5 C) 98.2 F (36.8 C) 98.1 F (36.7 C)   TempSrc: Oral Oral Oral   SpO2: 97% 94% 94% 93%  Weight: 65.4 kg (144 lb 2.9 oz)     Height:       Filed Weights   02/04/17 2144 02/06/17 0300  Weight: 67.2 kg (148 lb 2.4 oz) 65.4 kg (144 lb 2.9 oz)   Body mass index is 28.16 kg/m.   General appearance :Awake, alert, not in any distress.  Eyes:, pupils equally reactive to light and accomodation HEENT: Atraumatic and Normocephalic Neck: supple, no JVD.  Resp:Good air entry bilaterally, no added sounds anteriorly CVS: S1 S2 regular, no murmurs.  GI: Bowel sounds present, Non tender and not distended with no gaurding, rigidity or rebound. Extremities: B/L Lower Ext shows no edema, both legs are warm to touch Neurology:  speech clear,Non focal, sensation is grossly intact. Musculoskeletal:No digital cyanosis Skin:No Rash, warm and dry Wounds:N/A  I have personally reviewed following labs and imaging studies  LABORATORY DATA: CBC:  Recent Labs Lab 02/01/17 0003 02/02/17 0543 02/03/17 0435 02/04/17 0413 02/05/17 0258 02/06/17 0358  WBC 3.0* 5.0 6.3 4.4 4.7 4.2  NEUTROABS 1.8  --   --   --   --   --   HGB 12.7* 12.8* 12.9* 13.1 13.8 15.1  HCT 36.7* 37.8* 38.3* 38.5* 41.0 43.8  MCV 106.1* 107.7* 106.7* 107.5* 107.6* 107.4*  PLT 108* 97* 113* 121* 147* 162    Basic Metabolic Panel:  Recent Labs Lab 02/02/17 0543 02/03/17 0435 02/04/17 0413 02/05/17 0258 02/06/17 0358  NA 138 137 139 140 139  K 4.4 3.8 3.7 3.9 4.1  CL 106 102 103 101 101  CO2 25 28 28  32 27  GLUCOSE 93 94 78 88 88  BUN 7 10 12 14 19   CREATININE 0.83 1.07 0.98 1.07 0.96  CALCIUM 7.9* 7.8* 8.1* 8.2* 8.1*  MG  --   --   --  2.1  --   PHOS  --   --   --  3.6  --     GFR: Estimated Creatinine Clearance:  63.4 mL/min (by C-G formula based on SCr of 0.96 mg/dL).  Liver Function Tests:  Recent Labs Lab 02/02/17 0908  AST 52*  ALT 28  ALKPHOS 50  BILITOT 0.5  PROT 4.9*  ALBUMIN 2.1*   No results for input(s): LIPASE, AMYLASE in the last 168 hours. No results for input(s): AMMONIA in the last 168 hours.  Coagulation Profile: No results for input(s): INR, PROTIME in the last 168 hours.  Cardiac Enzymes:  Recent Labs Lab 01/31/17 1258 01/31/17 1857 02/01/17 0003  TROPONINI <0.03 <0.03 <0.03    BNP (last 3 results) No results for input(s): PROBNP in the last 8760 hours.  HbA1C: No results for input(s): HGBA1C in the last 72 hours.  CBG: No results for input(s): GLUCAP in the last 168 hours.  Lipid Profile: No results for input(s): CHOL, HDL, LDLCALC, TRIG, CHOLHDL, LDLDIRECT in the last 72 hours.  Thyroid Function Tests: No results for input(s): TSH, T4TOTAL, FREET4, T3FREE, THYROIDAB in the last 72 hours.  Anemia Panel: No results for input(s): VITAMINB12, FOLATE, FERRITIN, TIBC, IRON, RETICCTPCT in the last 72 hours.  Urine analysis: No results found for: COLORURINE, APPEARANCEUR, LABSPEC, PHURINE, GLUCOSEU, HGBUR, BILIRUBINUR, KETONESUR, PROTEINUR, UROBILINOGEN, NITRITE, LEUKOCYTESUR  Sepsis Labs: Lactic Acid, Venous    Component Value Date/Time   LATICACIDVEN 0.93 01/31/2017 1122    MICROBIOLOGY: Recent Results (from the past 240 hour(s))  Culture, blood (routine x 2) Call MD if unable to obtain prior to antibiotics being given     Status: None   Collection Time: 01/31/17  1:00 PM  Result Value Ref Range Status   Specimen Description BLOOD RIGHT ANTECUBITAL  Final   Special Requests   Final    BOTTLES DRAWN AEROBIC AND ANAEROBIC 10CC AER 5CC ANA   Culture NO GROWTH 5 DAYS  Final   Report Status 02/05/2017 FINAL  Final  Culture, blood (routine x 2) Call MD if unable to obtain prior to antibiotics being given     Status: None   Collection Time: 01/31/17   1:14 PM  Result Value Ref Range Status   Specimen Description BLOOD RIGHT HAND  Final   Special Requests BOTTLES DRAWN AEROBIC ONLY 10CC  Final   Culture NO GROWTH 5 DAYS  Final   Report Status 02/05/2017 FINAL  Final  MRSA PCR Screening     Status: None   Collection Time: 02/02/17 10:26 AM  Result Value Ref Range Status   MRSA by PCR NEGATIVE NEGATIVE Final    Comment:        The GeneXpert MRSA Assay (FDA approved for NASAL specimens only), is one component of a comprehensive MRSA colonization surveillance program. It is not intended to diagnose MRSA infection nor to guide or monitor treatment for MRSA infections.     RADIOLOGY STUDIES/RESULTS: Dg Chest 2 View  Result Date: 01/31/2017 CLINICAL DATA:  Generalized chest pain, shortness of breath. The patient is mentally challenged. EXAM: CHEST  2 VIEW COMPARISON:  Chest x-ray of July 04, 2013 and chest CT scan of July 05, 2013 FINDINGS: The lungs are adequately inflated. New abnormal interstitial and alveolar opacity is present at the lung bases greatest on the left. No definite pleural effusion is observed. The heart borders are obscured. The pulmonary vascularity is not clearly engorged. There is calcification in the wall of the aortic arch. There is multilevel degenerative disc disease of the visualized portions of the thoracic spine. IMPRESSION: Findings worrisome for lingular and right middle and anterior lower lobe pneumonia. No definite pulmonary edema or pleural effusion. Followup PA and lateral chest X-ray is recommended in 3-4 weeks following trial of antibiotic therapy to ensure resolution and exclude underlying malignancy. Thoracic aortic atherosclerosis. Electronically Signed   By: David  [place] M.D.   On: 01/31/2017 10:31   Dg Chest Port 1 View  Result Date: 02/06/2017 CLINICAL DATA:  61 year old male with chest pain, shortness of breath, hypoxia. Initial encounter. EXAM: PORTABLE CHEST 1 VIEW COMPARISON:  02/05/2017 and  earlier. FINDINGS: Portable AP semi upright view at 0329 hours. Lower lung volumes compared to yesterday. Patchy and nodular bibasilar pulmonary opacity persists, progressed on the left since 02/04/2017. There is mild involvement of the right upper lobe. The left upper lung remain spared. No pneumothorax or pulmonary edema. Calcified aortic atherosclerosis. Grossly stable mediastinal contours. No definite pleural effusion. Negative visible bowel gas pattern. IMPRESSION: Lower lung volumes compared the yesterday. Multifocal bilateral pneumonia suspected with interval worsening ventilation at the left lung base. Electronically Signed   By: Odessa FlemingH  Hall M.D.   On: 02/06/2017 06:53   Dg Chest Port 1 View  Result Date: 02/05/2017 CLINICAL DATA:  61 year old with acute hypoxia. EXAM: PORTABLE CHEST 1 VIEW COMPARISON:  02/04/2017, 01/31/2017 and earlier, including CTA chest 07/05/2013. FINDINGS: Suboptimal inspiration. Cardiac silhouette mildly to moderately enlarged for technique, unchanged. New patchy opacities in the right upper lobe and at the left lung base since yesterday. Persistent patchy airspace opacities at the right lung base. Small right pleural effusion and possible small left pleural effusion, unchanged. Pulmonary vascularity normal. IMPRESSION: 1. Progressive pneumonia since yesterday, with new involvement of the right upper lobe a into the left lung base and stable involvement of the right lung base. 2. Stable small right pleural effusion a possible small left pleural effusion. Electronically Signed   By: Hulan Saashomas  Lawrence M.D.   On: 02/05/2017 07:46   Dg Chest Port 1 View  Result Date: 02/04/2017 CLINICAL DATA:  Hypoxia EXAM: PORTABLE CHEST 1 VIEW COMPARISON:  01/31/2017 chest radiograph. FINDINGS: Right rotated chest radiograph. Stable cardiomediastinal silhouette with mild cardiomegaly and aortic atherosclerosis. No pneumothorax. Small right pleural effusion appears new. No left pleural effusion. No  pulmonary edema. Patchy opacities at the right greater than left lung bases, not appreciably changed. IMPRESSION: 1. No appreciable change in patchy bibasilar lung opacities, right greater the left, which could represent atelectasis, pneumonia and/or aspiration. 2. New small right pleural effusion. 3. Mild cardiomegaly without pulmonary edema. 4. Aortic atherosclerosis. Electronically Signed   By: Delbert PhenixJason A Poff M.D.   On: 02/04/2017 08:01     LOS: 5 days   Jeoffrey MassedGHIMIRE,Tavi Hoogendoorn, MD  [facility] Pager:[phone]  If 7PM-7AM, please contact night-coverage [email or web address] Password [email or web address] 02/06/2017, 1:36 PM

## 2017-02-07 LAB — BASIC METABOLIC PANEL
ANION GAP: 8 (ref 5–15)
BUN: 16 mg/dL (ref 6–20)
CO2: 37 mmol/L — ABNORMAL HIGH (ref 22–32)
Calcium: 8.4 mg/dL — ABNORMAL LOW (ref 8.9–10.3)
Chloride: 96 mmol/L — ABNORMAL LOW (ref 101–111)
Creatinine, Ser: 1 mg/dL (ref 0.61–1.24)
GFR calc Af Amer: >60 mL/min (ref >60)
GLUCOSE: 94 mg/dL (ref 65–99)
POTASSIUM: 3.3 mmol/L — AB (ref 3.5–5.1)
SODIUM: 141 mmol/L (ref 135–145)

## 2017-02-07 MED ORDER — FUROSEMIDE 40 MG PO TABS
40.0000 mg | ORAL_TABLET | Freq: Every day | ORAL | Status: DC
  Administered 2017-02-08 – 2017-02-11 (×4): 40 mg via ORAL
  Filled 2017-02-07 (×4): qty 1

## 2017-02-07 MED ORDER — POTASSIUM CHLORIDE CRYS ER 20 MEQ PO TBCR
40.0000 meq | EXTENDED_RELEASE_TABLET | Freq: Once | ORAL | Status: AC
  Administered 2017-02-07: 40 meq via ORAL
  Filled 2017-02-07: qty 2

## 2017-02-07 NOTE — Progress Notes (Signed)
PROGRESS NOTE        PATIENT DETAILS Name: Travis Sampson Age: 61 y.o. Sex: male Date of Birth: 30-Dec-1955 Admit Date: 01/31/2017 Admitting Physician Haydee Salter, MD [id number] Hetty Ely, MD  Brief Narrative: Patient is a 61 y.o. male Downs syndrome, dyslipidemia, hypothyroidism admitted on 3/1 with influenza/pneumonitis, hospital course complicated by worsening hypoxic respiratory failure requiring transfer to stepdown. Slowly improving with supportive measures. See below for further details.  Subjective: Lying comfortably in bed-does not appear to be in any distress.  Assessment/Plan: Acute hypoxemic respiratory failure: Secondary to influenza and probable bacterial pneumonia. Oxygen been slowly tapered down, not longer requiring BiPAP.   Influenza with pneumonitis: Has completed more than 5 days of empiric Tamiflu-discontinue today. Has completed a week therapy with IV Rocephin, clinically improved-suspect we can now discontinue all antimicrobial therapy.   Dyslipidemia: Continue statin  Hypothyroidism: Continue Synthroid-dosage increased to 75 g, recheck TSH in 3 months  History of down syndrome: Apparently current mental status is close to his baseline. Continue Namenda, Aricept, Seroquel and Depakote.  DVT Prophylaxis: Prophylactic Heparin   Code Status: Full code  Family Communication: None at bedside  Disposition Plan: Remain inpatient- SNF on discharge. Transfer to MedSurg unit.  Antimicrobial agents: Anti-infectives    Start     Dose/Rate Route Frequency Ordered Stop   02/02/17 1300  azithromycin (ZITHROMAX) tablet 500 mg     500 mg Oral Daily 02/02/17 1221 02/04/17 1126   02/01/17 1200  azithromycin (ZITHROMAX) 500 mg in dextrose 5 % 250 mL IVPB  Status:  Discontinued     500 mg 250 mL/hr over 60 Minutes Intravenous Every 24 hours 01/31/17 1230 02/02/17 1221   02/01/17 1030  cefTRIAXone (ROCEPHIN) 1 g in dextrose 5 % 50 mL IVPB      1 g 100 mL/hr over 30 Minutes Intravenous Every 24 hours 01/31/17 1230 02/07/17 0941   01/31/17 1245  oseltamivir (TAMIFLU) capsule 75 mg  Status:  Discontinued     75 mg Oral 2 times daily 01/31/17 1230 02/06/17 1345   01/31/17 1130  azithromycin (ZITHROMAX) 500 mg in dextrose 5 % 250 mL IVPB     500 mg 250 mL/hr over 60 Minutes Intravenous  Once 01/31/17 1119 01/31/17 1317   01/31/17 1130  oseltamivir (TAMIFLU) capsule 75 mg     75 mg Oral  Once 01/31/17 1128 01/31/17 1217   01/31/17 1045  cefTRIAXone (ROCEPHIN) 1 g in dextrose 5 % 50 mL IVPB     1 g 100 mL/hr over 30 Minutes Intravenous  Once 01/31/17 1042 01/31/17 1117   01/31/17 1045  azithromycin (ZITHROMAX) 500 mg in dextrose 5 % 250 mL IVPB  Status:  Discontinued     500 mg 250 mL/hr over 60 Minutes Intravenous  Once 01/31/17 1042 01/31/17 1059      Procedures: None  CONSULTS:  None  Time spent: 25 minutes-Greater than 50% of this time was spent in counseling, explanation of diagnosis, planning of further management, and coordination of care.  MEDICATIONS: Scheduled Meds: . aspirin EC  81 mg Oral Daily  . chlorhexidine  15 mL Mouth/Throat BID  . chlorhexidine  15 mL Mouth Rinse BID  . divalproex  750 mg Oral Daily  . donepezil  10 mg Oral QHS  . furosemide  40 mg Intravenous BID  . guaiFENesin  600 mg  Oral BID  . heparin  5,000 Units Subcutaneous Q8H  . latanoprost  1 drop Both Eyes QHS  . levothyroxine  75 mcg Oral QAC breakfast  . mouth rinse  15 mL Mouth Rinse q12n4p  . memantine  10 mg Oral BID  . multivitamin with minerals  1 tablet Oral Daily  . QUEtiapine  50 mg Oral QHS  . sertraline  50 mg Oral Daily  . simvastatin  20 mg Oral q1800   Continuous Infusions: PRN Meds:.acetaminophen **OR** acetaminophen, guaiFENesin-dextromethorphan, ipratropium-albuterol, ondansetron **OR** ondansetron (ZOFRAN) IV, polyethylene glycol   PHYSICAL EXAM: Vital signs: Vitals:   02/07/17 0000 02/07/17 0400 02/07/17  0417 02/07/17 0815  BP: 130/86 111/68 111/68 119/67  Pulse:   80   Resp: 12 16 16 17   Temp:   99.2 F (37.3 C) 98.1 F (36.7 C)  TempSrc:   Axillary Axillary  SpO2: 99% 97% 97% (!) 87%  Weight:   65.5 kg (144 lb 6.4 oz)   Height:       Filed Weights   02/04/17 2144 02/06/17 0300 02/07/17 0417  Weight: 67.2 kg (148 lb 2.4 oz) 65.4 kg (144 lb 2.9 oz) 65.5 kg (144 lb 6.4 oz)   Body mass index is 28.2 kg/m.   General appearance :Awake, alert, not in any distress.  Eyes:, pupils equally reactive to light and accomodation HEENT: Atraumatic and Normocephalic Neck: supple, no JVD.  Resp:Good air entry bilaterally, no added sounds anteriorly CVS: S1 S2 regular, no murmurs.  GI: Bowel sounds present, Non tender and not distended with no gaurding, rigidity or rebound. Extremities: B/L Lower Ext shows no edema, both legs are warm to touch Neurology:  speech clear,Non focal, sensation is grossly intact. Musculoskeletal:No digital cyanosis Skin:No Rash, warm and dry Wounds:N/A  I have personally reviewed following labs and imaging studies  LABORATORY DATA: CBC:  Recent Labs Lab 02/01/17 0003 02/02/17 0543 02/03/17 0435 02/04/17 0413 02/05/17 0258 02/06/17 0358  WBC 3.0* 5.0 6.3 4.4 4.7 4.2  NEUTROABS 1.8  --   --   --   --   --   HGB 12.7* 12.8* 12.9* 13.1 13.8 15.1  HCT 36.7* 37.8* 38.3* 38.5* 41.0 43.8  MCV 106.1* 107.7* 106.7* 107.5* 107.6* 107.4*  PLT 108* 97* 113* 121* 147* 162    Basic Metabolic Panel:  Recent Labs Lab 02/03/17 0435 02/04/17 0413 02/05/17 0258 02/06/17 0358 02/07/17 0625  NA 137 139 140 139 141  K 3.8 3.7 3.9 4.1 3.3*  CL 102 103 101 101 96*  CO2 28 28 32 27 37*  GLUCOSE 94 78 88 88 94  BUN 10 12 14 19 16   CREATININE 1.07 0.98 1.07 0.96 1.00  CALCIUM 7.8* 8.1* 8.2* 8.1* 8.4*  MG  --   --  2.1  --   --   PHOS  --   --  3.6  --   --     GFR: Estimated Creatinine Clearance: 60.9 mL/min (by C-G formula based on SCr of 1 mg/dL).  Liver  Function Tests:  Recent Labs Lab 02/02/17 0908  AST 52*  ALT 28  ALKPHOS 50  BILITOT 0.5  PROT 4.9*  ALBUMIN 2.1*   No results for input(s): LIPASE, AMYLASE in the last 168 hours. No results for input(s): AMMONIA in the last 168 hours.  Coagulation Profile: No results for input(s): INR, PROTIME in the last 168 hours.  Cardiac Enzymes:  Recent Labs Lab 01/31/17 1258 01/31/17 1857 02/01/17 0003  TROPONINI <0.03 <0.03 <0.03  BNP (last 3 results) No results for input(s): PROBNP in the last 8760 hours.  HbA1C: No results for input(s): HGBA1C in the last 72 hours.  CBG: No results for input(s): GLUCAP in the last 168 hours.  Lipid Profile: No results for input(s): CHOL, HDL, LDLCALC, TRIG, CHOLHDL, LDLDIRECT in the last 72 hours.  Thyroid Function Tests: No results for input(s): TSH, T4TOTAL, FREET4, T3FREE, THYROIDAB in the last 72 hours.  Anemia Panel: No results for input(s): VITAMINB12, FOLATE, FERRITIN, TIBC, IRON, RETICCTPCT in the last 72 hours.  Urine analysis: No results found for: COLORURINE, APPEARANCEUR, LABSPEC, PHURINE, GLUCOSEU, HGBUR, BILIRUBINUR, KETONESUR, PROTEINUR, UROBILINOGEN, NITRITE, LEUKOCYTESUR  Sepsis Labs: Lactic Acid, Venous    Component Value Date/Time   LATICACIDVEN 0.93 01/31/2017 1122    MICROBIOLOGY: Recent Results (from the past 240 hour(s))  Culture, blood (routine x 2) Call MD if unable to obtain prior to antibiotics being given     Status: None   Collection Time: 01/31/17  1:00 PM  Result Value Ref Range Status   Specimen Description BLOOD RIGHT ANTECUBITAL  Final   Special Requests   Final    BOTTLES DRAWN AEROBIC AND ANAEROBIC 10CC AER 5CC ANA   Culture NO GROWTH 5 DAYS  Final   Report Status 02/05/2017 FINAL  Final  Culture, blood (routine x 2) Call MD if unable to obtain prior to antibiotics being given     Status: None   Collection Time: 01/31/17  1:14 PM  Result Value Ref Range Status   Specimen Description  BLOOD RIGHT HAND  Final   Special Requests BOTTLES DRAWN AEROBIC ONLY 10CC  Final   Culture NO GROWTH 5 DAYS  Final   Report Status 02/05/2017 FINAL  Final  MRSA PCR Screening     Status: None   Collection Time: 02/02/17 10:26 AM  Result Value Ref Range Status   MRSA by PCR NEGATIVE NEGATIVE Final    Comment:        The GeneXpert MRSA Assay (FDA approved for NASAL specimens only), is one component of a comprehensive MRSA colonization surveillance program. It is not intended to diagnose MRSA infection nor to guide or monitor treatment for MRSA infections.     RADIOLOGY STUDIES/RESULTS: Dg Chest 2 View  Result Date: 01/31/2017 CLINICAL DATA:  Generalized chest pain, shortness of breath. The patient is mentally challenged. EXAM: CHEST  2 VIEW COMPARISON:  Chest x-ray of July 04, 2013 and chest CT scan of July 05, 2013 FINDINGS: The lungs are adequately inflated. New abnormal interstitial and alveolar opacity is present at the lung bases greatest on the left. No definite pleural effusion is observed. The heart borders are obscured. The pulmonary vascularity is not clearly engorged. There is calcification in the wall of the aortic arch. There is multilevel degenerative disc disease of the visualized portions of the thoracic spine. IMPRESSION: Findings worrisome for lingular and right middle and anterior lower lobe pneumonia. No definite pulmonary edema or pleural effusion. Followup PA and lateral chest X-ray is recommended in 3-4 weeks following trial of antibiotic therapy to ensure resolution and exclude underlying malignancy. Thoracic aortic atherosclerosis. Electronically Signed   By: David  [place] M.D.   On: 01/31/2017 10:31   Dg Chest Port 1 View  Result Date: 02/06/2017 CLINICAL DATA:  61 year old male with chest pain, shortness of breath, hypoxia. Initial encounter. EXAM: PORTABLE CHEST 1 VIEW COMPARISON:  02/05/2017 and earlier. FINDINGS: Portable AP semi upright view at 0329 hours.  Lower lung volumes compared to yesterday.  Patchy and nodular bibasilar pulmonary opacity persists, progressed on the left since 02/04/2017. There is mild involvement of the right upper lobe. The left upper lung remain spared. No pneumothorax or pulmonary edema. Calcified aortic atherosclerosis. Grossly stable mediastinal contours. No definite pleural effusion. Negative visible bowel gas pattern. IMPRESSION: Lower lung volumes compared the yesterday. Multifocal bilateral pneumonia suspected with interval worsening ventilation at the left lung base. Electronically Signed   By: Odessa FlemingH  Hall M.D.   On: 02/06/2017 06:53   Dg Chest Port 1 View  Result Date: 02/05/2017 CLINICAL DATA:  61 year old with acute hypoxia. EXAM: PORTABLE CHEST 1 VIEW COMPARISON:  02/04/2017, 01/31/2017 and earlier, including CTA chest 07/05/2013. FINDINGS: Suboptimal inspiration. Cardiac silhouette mildly to moderately enlarged for technique, unchanged. New patchy opacities in the right upper lobe and at the left lung base since yesterday. Persistent patchy airspace opacities at the right lung base. Small right pleural effusion and possible small left pleural effusion, unchanged. Pulmonary vascularity normal. IMPRESSION: 1. Progressive pneumonia since yesterday, with new involvement of the right upper lobe a into the left lung base and stable involvement of the right lung base. 2. Stable small right pleural effusion a possible small left pleural effusion. Electronically Signed   By: Hulan Saashomas  Lawrence M.D.   On: 02/05/2017 07:46   Dg Chest Port 1 View  Result Date: 02/04/2017 CLINICAL DATA:  Hypoxia EXAM: PORTABLE CHEST 1 VIEW COMPARISON:  01/31/2017 chest radiograph. FINDINGS: Right rotated chest radiograph. Stable cardiomediastinal silhouette with mild cardiomegaly and aortic atherosclerosis. No pneumothorax. Small right pleural effusion appears new. No left pleural effusion. No pulmonary edema. Patchy opacities at the right greater than left  lung bases, not appreciably changed. IMPRESSION: 1. No appreciable change in patchy bibasilar lung opacities, right greater the left, which could represent atelectasis, pneumonia and/or aspiration. 2. New small right pleural effusion. 3. Mild cardiomegaly without pulmonary edema. 4. Aortic atherosclerosis. Electronically Signed   By: Delbert PhenixJason A Poff M.D.   On: 02/04/2017 08:01     LOS: 6 days   Jeoffrey MassedGHIMIRE,SHANKER, MD  [facility] Pager:[phone]  If 7PM-7AM, please contact night-coverage [email or web address] Password [email or web address] 02/07/2017, 11:16 AM

## 2017-02-07 NOTE — Progress Notes (Signed)
Received patient from [facility]. Alert , non verbal.Oriented to unit and staff. VSS. Will monitor.

## 2017-02-07 NOTE — Evaluation (Signed)
Clinical/Bedside Swallow Evaluation Patient Details  Name: Travis Sampson MRN: [id number] Date of Birth: 02/07/1956  Today's Date: 02/07/2017 Time: SLP Start Time (ACUTE ONLY): 1545 SLP Stop Time (ACUTE ONLY): 1600 SLP Time Calculation (min) (ACUTE ONLY): 15 min  Past Medical History:  Past Medical History:  Diagnosis Date  . Alzheimer disease   . Dementia   . Down's syndrome   . Hyperlipidemia   . Hypothyroidism   . Macrocytosis 2007   MCV 104 w/o anemia  . SOB (shortness of breath)    Past Surgical History:  Past Surgical History:  Procedure Laterality Date  . CATARACT EXTRACTION, BILATERAL     Dr Wilkie Ayeigby/ [facility]  . COLONOSCOPY  2007   internal hemorrhoids  . MULTIPLE TOOTH EXTRACTIONS    . TONSILLECTOMY     HPI:   61 y.o. male Downs syndrome, dyslipidemia, hypothyroidism admitted on 3/1 with influenza/pneumonitis, hospital course complicated by worsening hypoxic respiratory failure requiring transfer to stepdown. Slowly improving with supportive measures.    Assessment / Plan / Recommendation Clinical Impression  Pt with baseline cough, but participated in swallow assessment with no oral/motor deficits; adequate anticipation/attention to POs;  RR well under threshold for concerns re: swallow/respiratory reciprocity.  CXR with multilobar pna and slight worsening.  Pt presents with very few risk factors for a dysphagia-related pna, but given persistent cough throughout assessment, will f/u again next date to assess overall safety.  SLP Visit Diagnosis: Dysphagia, unspecified (R13.10)    Aspiration Risk  Mild aspiration risk    Diet Recommendation   continue regular solids, thin liquids  Medication Administration: Whole meds with liquid    Other  Recommendations Oral Care Recommendations: Oral care BID   Follow up Recommendations None      Frequency and Duration min 1 x/week  1 week       Prognosis        Swallow Study   General Date of Onset: 01/31/17 HPI:   61 y.o. male Downs syndrome, dyslipidemia, hypothyroidism admitted on 3/1 with influenza/pneumonitis, hospital course complicated by worsening hypoxic respiratory failure requiring transfer to stepdown. Slowly improving with supportive measures.  Type of Study: Bedside Swallow Evaluation Previous Swallow Assessment: no Diet Prior to this Study: Regular;Thin liquids Temperature Spikes Noted: No Respiratory Status: Nasal cannula History of Recent Intubation: No Behavior/Cognition: Alert;Cooperative Oral Cavity Assessment: Dried secretions Oral Care Completed by SLP: Yes Oral Cavity - Dentition: Adequate natural dentition Vision: Functional for self-feeding Self-Feeding Abilities: Able to feed self Patient Positioning: Upright in bed Baseline Vocal Quality: Normal Volitional Cough: Strong Volitional Swallow: Unable to elicit    Oral/Motor/Sensory Function Overall Oral Motor/Sensory Function: Within functional limits   Ice Chips Ice chips: Within functional limits   Thin Liquid Thin Liquid: Impaired Presentation: Cup;Straw Pharyngeal  Phase Impairments: Cough - Delayed    Nectar Thick Nectar Thick Liquid: Not tested   Honey Thick Honey Thick Liquid: Not tested   Puree Puree: Within functional limits   Solid   GO   Solid: Within functional limits        Travis Sampson, Travis Sampson Travis Sampson 02/07/2017,4:16 PM

## 2017-02-07 NOTE — Progress Notes (Signed)
Patient being evaluated by [facility]RR today.  Osborne Cascoadia Piotr Christopher LCSWA [phone]

## 2017-02-07 NOTE — Care Management Note (Signed)
Case Management Note  Patient Details  Name: Travis Sampson MRN: [id number] Date of Birth: 01/21/1956  Subjective/Objective:   Admitted with CP                Action/Plan:  PTA from ALF   Expected Discharge Date:                  Expected Discharge Plan:  Skilled Nursing Facility (From ALF will discharge to SNF)  In-House Referral:  Clinical Social Work  Discharge planning Services  CM Consult  Post Acute Care Choice:    Choice offered to:     DME Arranged:    DME Agency:     HH Arranged:    HH Agency:     Status of Service:  In process, will continue to follow  If discussed at Long Length of Stay Meetings, dates discussed:    Additional Comments: Discussed in LOS 02/07/17.  CM contacted attending to request consideration of discharge to SNF today.  CM will continue to follow for discharge Cherylann ParrClaxton, Miraya Cudney S, RN 02/07/2017, 9:00 AM

## 2017-02-07 NOTE — Progress Notes (Signed)
Physical Therapy Treatment Patient Details Name: Travis Sampson MRN: [id number] DOB: [birth year] Today's Date: 02/07/2017    History of Present Illness Pt is a 61 y.o. male with medical history significant of Down's syndrome, Alzheimer dementia, who was transferred to the ED after he was found in the facility agitated, holding his chest and complaining of chest pain. Pt found to have pneumonia with respiratory failure. Pt with incr O2 requirements and transferred to step down.    PT Comments    Pt admitted with above diagnosis. Pt currently with functional limitations due to balance and endurance deficits. Pt was able to sit EOB 12 min with PT assist +2 mod to max assist.  Pt did stand needing +2 mod to max assist.  Cognition plays a role in ability to progress pt as he tends to have a delay in processing of information.  Will continue acute PT.   Pt will benefit from skilled PT to increase their independence and safety with mobility to allow discharge to the venue listed below.     Follow Up Recommendations  SNF (unless ALF able to provide total care)     Equipment Recommendations  Other (comment) (To be assessed)    Recommendations for Other Services       Precautions / Restrictions Precautions Precautions: Fall Restrictions Weight Bearing Restrictions: No    Mobility  Bed Mobility Overal bed mobility: Needs Assistance Bed Mobility: Sit to Supine;Supine to Sit     Supine to sit: +2 for physical assistance;Total assist Sit to supine: +2 for physical assistance;Total assist   General bed mobility comments: Assist with trunk and LEs  Transfers Overall transfer level: Needs assistance Equipment used: 2 person hand held assist Transfers: Sit to/from Stand Sit to Stand: Max assist;+2 physical assistance         General transfer comment: Pt would not respond to commmand "stand up" but when PT said "Let's go" pt stood up.  Pt was able to stand for up to a minute with mod  to max assist of 2 persons with bil UE support on chair back in front of pt.  After 1 min, pt began to lean backwards and cry therefore assisted pt to sitting with mod assist as pt tends to not bend at trunk once he extends.    Ambulation/Gait Ambulation/Gait assistance: Mod assist;+2 physical assistance Ambulation Distance (Feet): 2 Feet Assistive device: 2 person hand held assist Gait Pattern/deviations: Step-to pattern;Narrow base of support;Staggering right;Antalgic;Leaning posteriorly   Gait velocity interpretation: Below normal speed for age/gender General Gait Details: Pt took a few lateral steps to [facility] but as he did his posture continued to go into greater extension and was decr in steadiness needing incr assist.  Had to assist pt to sitting as he began to extend too far posteriorly.     Stairs            Wheelchair Mobility    Modified Rankin (Stroke Patients Only)       Balance Overall balance assessment: Needs assistance Sitting-balance support: Feet supported;Bilateral upper extremity supported Sitting balance-Leahy Scale: Poor Sitting balance - Comments: Pt sat EOB x 12 minutes with total to min guard A. Primarily pt mod to max assist due to heavy posterior and right lean. Attempted having pt lean onto left elbow and this did help pt sit at midline for about 30 seconds but pt quickly began leaning posteriorly again. Occasionally pt min guard assist but for seconds only at a time and not really  responsive to verbal cues. Tactile cues worked better.   Postural control: Posterior lean;Right lateral lean Standing balance support: Bilateral upper extremity supported;During functional activity Standing balance-Leahy Scale: Poor Standing balance comment: Requiring mod to max assist +2 in standing.                    Cognition Arousal/Alertness: Awake/alert Behavior During Therapy: Flat affect Overall Cognitive Status: No family/caregiver present to determine  baseline cognitive functioning                 General Comments: Followed some one step commands with multimodal cues and incr time.    Exercises      General Comments        Pertinent Vitals/Pain Pain Assessment: Faces Faces Pain Scale: Hurts even more Pain Location: right foot/leg Pain Descriptors / Indicators: Grimacing Pain Intervention(s): Limited activity within patient's tolerance;Monitored during session;Repositioned  Hr 72bpm, 87% O2 on 1L on arrival but >90% on 1L O2 with activity and at end of treatment.  BP 119/67.   Home Living                      Prior Function            PT Goals (current goals can now be found in the care plan section) Progress towards PT goals: Progressing toward goals    Frequency    Min 3X/week      PT Plan Current plan remains appropriate    Co-evaluation             End of Session Equipment Utilized During Treatment: Gait belt;Oxygen Activity Tolerance: Patient limited by fatigue (limited by cognition) Patient left: in bed;with call bell/phone within reach;with bed alarm set (with bed in chair position) Nurse Communication: Mobility status;Need for lift equipment PT Visit Diagnosis: Muscle weakness (generalized) (M62.81)     Time: [id number] PT Time Calculation (min) (ACUTE ONLY): 16 min  Charges:  $Therapeutic Activity: 8-22 mins                    G Codes:       Berline LopesDawn F Mccormick [place] 02/07/2017, 10:33 AM  Eber Jonesawn Leilana Mcquire,PT Acute Rehabilitation [phone] (pager)

## 2017-02-08 DIAGNOSIS — J9601 Acute respiratory failure with hypoxia: Secondary | ICD-10-CM

## 2017-02-08 MED ORDER — FUROSEMIDE 40 MG PO TABS: 40.0000 mg | ORAL_TABLET | Freq: Every day | ORAL | 0 refills | Status: AC

## 2017-02-08 MED ORDER — POLYETHYLENE GLYCOL 3350 17 G PO PACK: 17.0000 g | PACK | Freq: Every day | ORAL | 0 refills | Status: DC | PRN

## 2017-02-08 MED ORDER — LEVOTHYROXINE SODIUM 75 MCG PO TABS
75.0000 ug | ORAL_TABLET | Freq: Every day | ORAL | 0 refills | Status: AC
Start: 2017-02-08 — End: ?

## 2017-02-08 MED ORDER — POTASSIUM CHLORIDE CRYS ER 20 MEQ PO TBCR
40.0000 meq | EXTENDED_RELEASE_TABLET | Freq: Once | ORAL | Status: AC
  Administered 2017-02-08: 40 meq via ORAL
  Filled 2017-02-08: qty 2

## 2017-02-08 MED ORDER — POTASSIUM CHLORIDE ER 20 MEQ PO TBCR: 8.0000 meq | EXTENDED_RELEASE_TABLET | Freq: Every day | ORAL | Status: AC

## 2017-02-08 NOTE — Discharge Summary (Addendum)
PATIENT DETAILS Name: Travis Sampson Age: 61 y.o. Sex: male Date of Birth: 02/13/1956 MRN: [id number]. Admitting Physician: Haydee SalterPhillip M Hobbs, MD WUJ:[id number]:Stacy Hetty ElyJ Burns, MD  Admit Date: 01/31/2017 Discharge date: 02/11/2017  Recommendations for Outpatient Follow-up:  1. Follow up with PCP in 1-2 weeks 2. Please obtain BMP/CBC in one week 3. Please recheck TSH in 3 months 4. Taper off O2 as tolerated 5. 2 view chest xray to ensure resolution of infiltrates in the next 4-6 weeks.  Admitted From:  ALF  Disposition: SNF   Home Health: No  Equipment/Devices: None  Discharge Condition: Stable  CODE STATUS: FULL CODE  Diet recommendation:  Heart Healthy   Brief Summary: See H&P, Labs, Consult and Test reports for all details in brief, Patient is a 61 y.o. male Downs syndrome, dyslipidemia, hypothyroidism admitted on 3/1 with influenza/pneumonitis, hospital course complicated by worsening hypoxic respiratory failure requiring transfer to stepdown. Slowly improving with supportive measures. See below for further details.  Brief Hospital Course: Acute hypoxemic respiratory failure: Secondary to influenza and probable bacterial pneumonia.Briefly required transfer to SDU for BiPAP support. After continued IV Abx, tamiflu-patient improved, Oxygen been slowly tapered down, not longer requiring BiPAP. Please continue to titrate down O2 as tolerated.   Influenza with pneumonitis: Has completed more than 5 days of empiric Tamiflu, and approximately 7 days of antibiotics. He is clinically improved, without fever and decreased O2 requirement. He does not require any further antimicrobial therapy, he will require a 2 view chest xray to ensure resolution of infiltrates in the next 4-6 weeks.  Dyslipidemia: Continue statin  Hypothyroidism: Continue Synthroid-dosage increased to 75 g, recheck TSH in 3 months  History of down syndrome: Apparently current mental status is close to his  baseline. Continue Namenda, Aricept, Seroquel and Depakote.  Constipation: Resolved, now on scheduled MiraLAX.   Procedures/Studies: None  Discharge Diagnoses:  Principal Problem:   CAP (community acquired pneumonia) Active Problems:   Hypothyroidism   Hyperlipidemia   DOWN SYNDROME   Dementia due to Alzheimer's disease   Chest pain   Discharge Instructions:  Activity:  As tolerated with Full fall precautions use walker/cane & assistance as needed   Discharge Instructions    Call MD for:  difficulty breathing, headache or visual disturbances    Complete by:  As directed    Call MD for:  persistant dizziness or light-headedness    Complete by:  As directed    Diet - low sodium heart healthy    Complete by:  As directed    Increase activity slowly    Complete by:  As directed      Allergies as of 02/11/2017   No Known Allergies     Medication List    STOP taking these medications   atorvastatin 10 MG tablet Commonly known as:  LIPITOR   hydrochlorothiazide 12.5 MG tablet Commonly known as:  HYDRODIURIL     TAKE these medications   aluminum & magnesium hydroxide-simethicone 500-450-40 MG/5ML suspension Commonly known as:  MYLANTA Take by mouth every 4 (four) hours as needed for indigestion.   aspirin 81 MG tablet Take 81 mg by mouth daily.   bisacodyl 10 MG suppository Commonly known as:  DULCOLAX Place 1 suppository (10 mg total) rectally daily as needed for moderate constipation.   chlorhexidine 0.12 % solution Commonly known as:  PERIDEX Use as directed 15 mLs in the mouth or throat 2 (two) times daily.   diphenhydrAMINE 12.5 MG/5ML liquid Commonly known as:  BENADRYL  Take 12.5 mg by mouth 3 (three) times daily as needed for allergies.   divalproex 250 MG 24 hr tablet Commonly known as:  DEPAKOTE ER Take 750 mg by mouth daily. Dr.Milled rx'ed   donepezil 10 MG tablet Commonly known as:  ARICEPT Take 10 mg by mouth at bedtime. Dr.Miller rx'ed    furosemide 40 MG tablet Commonly known as:  LASIX Take 1 tablet (40 mg total) by mouth daily.   latanoprost 0.005 % ophthalmic solution Commonly known as:  XALATAN Place 1 drop into both eyes at bedtime.   levothyroxine 75 MCG tablet Commonly known as:  SYNTHROID, LEVOTHROID Take 1 tablet (75 mcg total) by mouth daily. What changed:  medication strength  how much to take   memantine 10 MG tablet Commonly known as:  NAMENDA Take 10 mg by mouth 2 (two) times daily.   multivitamin tablet Take 1 tablet by mouth daily.   OCUSOFT EYE WASH OP Apply to eye. At bedtime   OCUSOFT LID SCRUB Pads Apply 1 each topically daily. RX'ed by Dr.Digby   polyethylene glycol packet Commonly known as:  MIRALAX / GLYCOLAX Take 17 g by mouth daily.   Potassium Chloride ER 20 MEQ Tbcr Take 8 mEq by mouth daily.   ROBITUSSIN DM 10-100 MG/5ML liquid Generic drug:  Dextromethorphan-Guaifenesin Take 5 mLs by mouth as needed. For cough   senna-docusate 8.6-50 MG tablet Commonly known as:  Senokot-S Take 2 tablets by mouth at bedtime as needed for mild constipation.   SEROQUEL 25 MG tablet Generic drug:  QUEtiapine Take 50 mg by mouth at bedtime.   sertraline 50 MG tablet Commonly known as:  ZOLOFT Take 50 mg by mouth daily.   simvastatin 20 MG tablet Commonly known as:  ZOCOR Take 20 mg by mouth daily.   VIRT-VITE PLUS PO Take by mouth daily before breakfast.       Contact information for follow-up providers    Pincus Sanes, MD. Schedule an appointment as soon as possible for a visit in 1 week(s).   Specialty:  Internal Medicine Contact information: [address] [place] [address] [phone]            Contact information for after-discharge care    Destination    [facility] Follow up.   Specialty:  Skilled Nursing Facility Contact information: [address] [place] [address] [phone]                 No Known  Allergies  Consultations:   None  Other Procedures/Studies: Dg Chest 2 View  Result Date: 01/31/2017 CLINICAL DATA:  Generalized chest pain, shortness of breath. The patient is mentally challenged. EXAM: CHEST  2 VIEW COMPARISON:  Chest x-ray of July 04, 2013 and chest CT scan of July 05, 2013 FINDINGS: The lungs are adequately inflated. New abnormal interstitial and alveolar opacity is present at the lung bases greatest on the left. No definite pleural effusion is observed. The heart borders are obscured. The pulmonary vascularity is not clearly engorged. There is calcification in the wall of the aortic arch. There is multilevel degenerative disc disease of the visualized portions of the thoracic spine. IMPRESSION: Findings worrisome for lingular and right middle and anterior lower lobe pneumonia. No definite pulmonary edema or pleural effusion. Followup PA and lateral chest X-ray is recommended in 3-4 weeks following trial of antibiotic therapy to ensure resolution and exclude underlying malignancy. Thoracic aortic atherosclerosis. Electronically Signed   By: David  [place]  M.D.   On: 01/31/2017 10:31   Dg Chest Port 1 View  Result Date: 02/06/2017 CLINICAL DATA:  61 year old male with chest pain, shortness of breath, hypoxia. Initial encounter. EXAM: PORTABLE CHEST 1 VIEW COMPARISON:  02/05/2017 and earlier. FINDINGS: Portable AP semi upright view at 0329 hours. Lower lung volumes compared to yesterday. Patchy and nodular bibasilar pulmonary opacity persists, progressed on the left since 02/04/2017. There is mild involvement of the right upper lobe. The left upper lung remain spared. No pneumothorax or pulmonary edema. Calcified aortic atherosclerosis. Grossly stable mediastinal contours. No definite pleural effusion. Negative visible bowel gas pattern. IMPRESSION: Lower lung volumes compared the yesterday. Multifocal bilateral pneumonia suspected with interval worsening ventilation at the left lung  base. Electronically Signed   By: Odessa Fleming M.D.   On: 02/06/2017 06:53   Dg Chest Port 1 View  Result Date: 02/05/2017 CLINICAL DATA:  61 year old with acute hypoxia. EXAM: PORTABLE CHEST 1 VIEW COMPARISON:  02/04/2017, 01/31/2017 and earlier, including CTA chest 07/05/2013. FINDINGS: Suboptimal inspiration. Cardiac silhouette mildly to moderately enlarged for technique, unchanged. New patchy opacities in the right upper lobe and at the left lung base since yesterday. Persistent patchy airspace opacities at the right lung base. Small right pleural effusion and possible small left pleural effusion, unchanged. Pulmonary vascularity normal. IMPRESSION: 1. Progressive pneumonia since yesterday, with new involvement of the right upper lobe a into the left lung base and stable involvement of the right lung base. 2. Stable small right pleural effusion a possible small left pleural effusion. Electronically Signed   By: Hulan Saas M.D.   On: 02/05/2017 07:46   Dg Chest Port 1 View  Result Date: 02/04/2017 CLINICAL DATA:  Hypoxia EXAM: PORTABLE CHEST 1 VIEW COMPARISON:  01/31/2017 chest radiograph. FINDINGS: Right rotated chest radiograph. Stable cardiomediastinal silhouette with mild cardiomegaly and aortic atherosclerosis. No pneumothorax. Small right pleural effusion appears new. No left pleural effusion. No pulmonary edema. Patchy opacities at the right greater than left lung bases, not appreciably changed. IMPRESSION: 1. No appreciable change in patchy bibasilar lung opacities, right greater the left, which could represent atelectasis, pneumonia and/or aspiration. 2. New small right pleural effusion. 3. Mild cardiomegaly without pulmonary edema. 4. Aortic atherosclerosis. Electronically Signed   By: Delbert Phenix M.D.   On: 02/04/2017 08:01     TODAY-DAY OF DISCHARGE:  Subjective:   Marti Sleigh today has no headache,no chest abdominal pain  Objective:   Blood pressure 129/78, pulse 78,  temperature 97.5 F (36.4 C), temperature source Axillary, resp. rate 18, height 5' (1.524 m), weight 64.6 kg (142 lb 6.7 oz), SpO2 92 %.  Intake/Output Summary (Last 24 hours) at 02/11/17 0952 Last data filed at 02/11/17 0457  Gross per 24 hour  Intake              170 ml  Output              600 ml  Net             -430 ml   Filed Weights   02/09/17 0500 02/10/17 0503 02/11/17 0457  Weight: 64.3 kg (141 lb 12.1 oz) 64.5 kg (142 lb 3.2 oz) 64.6 kg (142 lb 6.7 oz)    Exam: Awake Alert,  No new F.N deficits, Normal affect [place].AT,PERRAL Supple Neck,No JVD, No cervical lymphadenopathy appriciated.  Symmetrical Chest wall movement, Good air movement bilaterally, CTAB RRR,No Gallops,Rubs or new Murmurs, No Parasternal Heave +ve B.Sounds, Abd Soft, Non tender, No organomegaly appriciated,  No rebound -guarding or rigidity. No Cyanosis, Clubbing or edema, No new Rash or bruise   PERTINENT RADIOLOGIC STUDIES: Dg Chest 2 View  Result Date: 01/31/2017 CLINICAL DATA:  Generalized chest pain, shortness of breath. The patient is mentally challenged. EXAM: CHEST  2 VIEW COMPARISON:  Chest x-ray of July 04, 2013 and chest CT scan of July 05, 2013 FINDINGS: The lungs are adequately inflated. New abnormal interstitial and alveolar opacity is present at the lung bases greatest on the left. No definite pleural effusion is observed. The heart borders are obscured. The pulmonary vascularity is not clearly engorged. There is calcification in the wall of the aortic arch. There is multilevel degenerative disc disease of the visualized portions of the thoracic spine. IMPRESSION: Findings worrisome for lingular and right middle and anterior lower lobe pneumonia. No definite pulmonary edema or pleural effusion. Followup PA and lateral chest X-ray is recommended in 3-4 weeks following trial of antibiotic therapy to ensure resolution and exclude underlying malignancy. Thoracic aortic atherosclerosis. Electronically  Signed   By: David  [place] M.D.   On: 01/31/2017 10:31   Dg Chest Port 1 View  Result Date: 02/06/2017 CLINICAL DATA:  61 year old male with chest pain, shortness of breath, hypoxia. Initial encounter. EXAM: PORTABLE CHEST 1 VIEW COMPARISON:  02/05/2017 and earlier. FINDINGS: Portable AP semi upright view at 0329 hours. Lower lung volumes compared to yesterday. Patchy and nodular bibasilar pulmonary opacity persists, progressed on the left since 02/04/2017. There is mild involvement of the right upper lobe. The left upper lung remain spared. No pneumothorax or pulmonary edema. Calcified aortic atherosclerosis. Grossly stable mediastinal contours. No definite pleural effusion. Negative visible bowel gas pattern. IMPRESSION: Lower lung volumes compared the yesterday. Multifocal bilateral pneumonia suspected with interval worsening ventilation at the left lung base. Electronically Signed   By: Odessa Fleming M.D.   On: 02/06/2017 06:53   Dg Chest Port 1 View  Result Date: 02/05/2017 CLINICAL DATA:  61 year old with acute hypoxia. EXAM: PORTABLE CHEST 1 VIEW COMPARISON:  02/04/2017, 01/31/2017 and earlier, including CTA chest 07/05/2013. FINDINGS: Suboptimal inspiration. Cardiac silhouette mildly to moderately enlarged for technique, unchanged. New patchy opacities in the right upper lobe and at the left lung base since yesterday. Persistent patchy airspace opacities at the right lung base. Small right pleural effusion and possible small left pleural effusion, unchanged. Pulmonary vascularity normal. IMPRESSION: 1. Progressive pneumonia since yesterday, with new involvement of the right upper lobe a into the left lung base and stable involvement of the right lung base. 2. Stable small right pleural effusion a possible small left pleural effusion. Electronically Signed   By: Hulan Saas M.D.   On: 02/05/2017 07:46   Dg Chest Port 1 View  Result Date: 02/04/2017 CLINICAL DATA:  Hypoxia EXAM: PORTABLE CHEST 1 VIEW  COMPARISON:  01/31/2017 chest radiograph. FINDINGS: Right rotated chest radiograph. Stable cardiomediastinal silhouette with mild cardiomegaly and aortic atherosclerosis. No pneumothorax. Small right pleural effusion appears new. No left pleural effusion. No pulmonary edema. Patchy opacities at the right greater than left lung bases, not appreciably changed. IMPRESSION: 1. No appreciable change in patchy bibasilar lung opacities, right greater the left, which could represent atelectasis, pneumonia and/or aspiration. 2. New small right pleural effusion. 3. Mild cardiomegaly without pulmonary edema. 4. Aortic atherosclerosis. Electronically Signed   By: Delbert Phenix M.D.   On: 02/04/2017 08:01     PERTINENT LAB RESULTS: CBC: No results for input(s): WBC, HGB, HCT, PLT in the last 72 hours. CMET CMP  Component Value Date/Time   NA 141 02/07/2017 0625   K 3.3 (L) 02/07/2017 0625   CL 96 (L) 02/07/2017 0625   CO2 37 (H) 02/07/2017 0625   GLUCOSE 94 02/07/2017 0625   BUN 16 02/07/2017 0625   CREATININE 1.00 02/07/2017 0625   CALCIUM 8.4 (L) 02/07/2017 0625   PROT 4.9 (L) 02/02/2017 0908   ALBUMIN 2.1 (L) 02/02/2017 0908   AST 52 (H) 02/02/2017 0908   ALT 28 02/02/2017 0908   ALKPHOS 50 02/02/2017 0908   BILITOT 0.5 02/02/2017 0908   GFRNONAA >60 02/07/2017 0625   GFRAA >60 02/07/2017 0625    GFR Estimated Creatinine Clearance: 60.5 mL/min (by C-G formula based on SCr of 1 mg/dL). No results for input(s): LIPASE, AMYLASE in the last 72 hours. No results for input(s): CKTOTAL, CKMB, CKMBINDEX, TROPONINI in the last 72 hours. Invalid input(s): POCBNP No results for input(s): DDIMER in the last 72 hours. No results for input(s): HGBA1C in the last 72 hours. No results for input(s): CHOL, HDL, LDLCALC, TRIG, CHOLHDL, LDLDIRECT in the last 72 hours. No results for input(s): TSH, T4TOTAL, T3FREE, THYROIDAB in the last 72 hours.  Invalid input(s): FREET3 No results for input(s):  VITAMINB12, FOLATE, FERRITIN, TIBC, IRON, RETICCTPCT in the last 72 hours. Coags: No results for input(s): INR in the last 72 hours.  Invalid input(s): PT Microbiology: Recent Results (from the past 240 hour(s))  MRSA PCR Screening     Status: None   Collection Time: 02/02/17 10:26 AM  Result Value Ref Range Status   MRSA by PCR NEGATIVE NEGATIVE Final    Comment:        The GeneXpert MRSA Assay (FDA approved for NASAL specimens only), is one component of a comprehensive MRSA colonization surveillance program. It is not intended to diagnose MRSA infection nor to guide or monitor treatment for MRSA infections.     FURTHER DISCHARGE INSTRUCTIONS:  Get Medicines reviewed and adjusted: Please take all your medications with you for your next visit with your Primary MD  Laboratory/radiological data: Please request your Primary MD to go over all hospital tests and procedure/radiological results at the follow up, please ask your Primary MD to get all Hospital records sent to his/her office.  In some cases, they will be blood work, cultures and biopsy results pending at the time of your discharge. Please request that your primary care M.D. goes through all the records of your hospital data and follows up on these results.  Also Note the following: If you experience worsening of your admission symptoms, develop shortness of breath, life threatening emergency, suicidal or homicidal thoughts you must seek medical attention immediately by calling 911 or calling your MD immediately  if symptoms less severe.  You must read complete instructions/literature along with all the possible adverse reactions/side effects for all the Medicines you take and that have been prescribed to you. Take any new Medicines after you have completely understood and accpet all the possible adverse reactions/side effects.   Do not drive when taking Pain medications or sleeping medications (Benzodaizepines)  Do not  take more than prescribed Pain, Sleep and Anxiety Medications. It is not advisable to combine anxiety,sleep and pain medications without talking with your primary care practitioner  Special Instructions: If you have smoked or chewed Tobacco  in the last 2 yrs please stop smoking, stop any regular Alcohol  and or any Recreational drug use.  Wear Seat belts while driving.  Please note: You were cared for by  a hospitalist during your hospital stay. Once you are discharged, your primary care physician will handle any further medical issues. Please note that NO REFILLS for any discharge medications will be authorized once you are discharged, as it is imperative that you return to your primary care physician (or establish a relationship with a primary care physician if you do not have one) for your post hospital discharge needs so that they can reassess your need for medications and monitor your lab values.  Total Time spent coordinating discharge including counseling, education and face to face time equals 45 minutes.  SignedJeoffrey Massed 02/11/2017 9:52 AM

## 2017-02-08 NOTE — Progress Notes (Signed)
PASRR under review. Patient unable to discharge to SNF without PASRR.  Osborne Cascoadia Latrelle Fuston LCSWA [phone]

## 2017-02-08 NOTE — Progress Notes (Signed)
When MD saw this morning he asked if we could take 02 off and see how the patient did, upon checking him off oxygen he was down around 86% o2, when put back on 2l, his oxygen level came up to 93%.

## 2017-02-09 NOTE — Progress Notes (Signed)
PROGRESS NOTE        PATIENT DETAILS Name: Travis Sampson Age: 61 y.o. Sex: male Date of Birth: [birth year] Admit Date: 01/31/2017 Admitting Physician Travis Salter, MD [id number] Travis Ely, MD  Brief Narrative: Patient is a 61 y.o. male Downs syndrome, dyslipidemia, hypothyroidism admitted on 3/1 with influenza/pneumonitis, hospital course complicated by worsening hypoxic respiratory failure requiring transfer to stepdown. Slowly improving with supportive measures. See below for further details.  Subjective: Eating breakfast. Says "I am alright"  Assessment/Plan: Acute hypoxemic respiratory failure: Secondary to influenza and probable bacterial pneumonia. Oxygen been slowly tapered down, not longer requiring BiPAP.    Influenza with pneumonitis: Has completed a course of Tamiflu and Rocephin. Improved-afebrile-decreasing O2 requirement.Blood cx negative.   Dyslipidemia: Continue statin  Hypothyroidism: Continue Synthroid-dosage increased to 75 g, recheck TSH in 3 months  History of down syndrome: Apparently current mental status is close to his baseline. Continue Namenda, Aricept, Seroquel and Depakote.  DVT Prophylaxis: Prophylactic Heparin   Code Status: Full code  Family Communication: None at bedside  Disposition Plan: Remain inpatient- SNF on discharge. Transfer to MedSurg unit.  Antimicrobial agents: Anti-infectives    Start     Dose/Rate Route Frequency Ordered Stop   02/02/17 1300  azithromycin (ZITHROMAX) tablet 500 mg     500 mg Oral Daily 02/02/17 1221 02/04/17 1126   02/01/17 1200  azithromycin (ZITHROMAX) 500 mg in dextrose 5 % 250 mL IVPB  Status:  Discontinued     500 mg 250 mL/hr over 60 Minutes Intravenous Every 24 hours 01/31/17 1230 02/02/17 1221   02/01/17 1030  cefTRIAXone (ROCEPHIN) 1 g in dextrose 5 % 50 mL IVPB     1 g 100 mL/hr over 30 Minutes Intravenous Every 24 hours 01/31/17 1230 02/07/17 0941   01/31/17 1245   oseltamivir (TAMIFLU) capsule 75 mg  Status:  Discontinued     75 mg Oral 2 times daily 01/31/17 1230 02/06/17 1345   01/31/17 1130  azithromycin (ZITHROMAX) 500 mg in dextrose 5 % 250 mL IVPB     500 mg 250 mL/hr over 60 Minutes Intravenous  Once 01/31/17 1119 01/31/17 1317   01/31/17 1130  oseltamivir (TAMIFLU) capsule 75 mg     75 mg Oral  Once 01/31/17 1128 01/31/17 1217   01/31/17 1045  cefTRIAXone (ROCEPHIN) 1 g in dextrose 5 % 50 mL IVPB     1 g 100 mL/hr over 30 Minutes Intravenous  Once 01/31/17 1042 01/31/17 1117   01/31/17 1045  azithromycin (ZITHROMAX) 500 mg in dextrose 5 % 250 mL IVPB  Status:  Discontinued     500 mg 250 mL/hr over 60 Minutes Intravenous  Once 01/31/17 1042 01/31/17 1059      Procedures: None  CONSULTS:  None  Time spent: 25 minutes-Greater than 50% of this time was spent in counseling, explanation of diagnosis, planning of further management, and coordination of care.  MEDICATIONS: Scheduled Meds: . aspirin EC  81 mg Oral Daily  . chlorhexidine  15 mL Mouth/Throat BID  . chlorhexidine  15 mL Mouth Rinse BID  . divalproex  750 mg Oral Daily  . donepezil  10 mg Oral QHS  . furosemide  40 mg Oral Daily  . guaiFENesin  600 mg Oral BID  . heparin  5,000 Units Subcutaneous Q8H  . latanoprost  1 drop Both Eyes  QHS  . levothyroxine  75 mcg Oral QAC breakfast  . mouth rinse  15 mL Mouth Rinse q12n4p  . memantine  10 mg Oral BID  . multivitamin with minerals  1 tablet Oral Daily  . QUEtiapine  50 mg Oral QHS  . sertraline  50 mg Oral Daily  . simvastatin  20 mg Oral q1800   Continuous Infusions: PRN Meds:.acetaminophen **OR** acetaminophen, guaiFENesin-dextromethorphan, ipratropium-albuterol, ondansetron **OR** ondansetron (ZOFRAN) IV, polyethylene glycol   PHYSICAL EXAM: Vital signs: Vitals:   02/08/17 2117 02/09/17 0500 02/09/17 0546 02/09/17 1306  BP: 103/78  97/83 115/67  Pulse: 64  60 62  Resp: 17  17 18   Temp: 97.7 F (36.5 C)  98  F (36.7 C) 98.4 F (36.9 C)  TempSrc: Oral  Oral Oral  SpO2: 99%  90% 90%  Weight:  64.3 kg (141 lb 12.1 oz)    Height:       Filed Weights   02/07/17 0417 02/08/17 0500 02/09/17 0500  Weight: 65.5 kg (144 lb 6.4 oz) 64.8 kg (142 lb 13.7 oz) 64.3 kg (141 lb 12.1 oz)   Body mass index is 27.68 kg/m.   General appearance :Awake, alert, not in any distress.  Eyes:, pupils equally reactive to light and accomodation HEENT: Atraumatic and Normocephalic Neck: supple, no JVD.  Resp:Good air entry bilaterally, no added sounds anteriorly CVS: S1 S2 regular, no murmurs.  GI: Bowel sounds present, Non tender and not distended with no gaurding, rigidity or rebound. Extremities: B/L Lower Ext shows no edema, both legs are warm to touch Neurology:  speech clear,Non focal, sensation is grossly intact. Musculoskeletal:No digital cyanosis Skin:No Rash, warm and dry Wounds:N/A  I have personally reviewed following labs and imaging studies  LABORATORY DATA: CBC:  Recent Labs Lab 02/03/17 0435 02/04/17 0413 02/05/17 0258 02/06/17 0358  WBC 6.3 4.4 4.7 4.2  HGB 12.9* 13.1 13.8 15.1  HCT 38.3* 38.5* 41.0 43.8  MCV 106.7* 107.5* 107.6* 107.4*  PLT 113* 121* 147* 162    Basic Metabolic Panel:  Recent Labs Lab 02/03/17 0435 02/04/17 0413 02/05/17 0258 02/06/17 0358 02/07/17 0625  NA 137 139 140 139 141  K 3.8 3.7 3.9 4.1 3.3*  CL 102 103 101 101 96*  CO2 28 28 32 27 37*  GLUCOSE 94 78 88 88 94  BUN 10 12 14 19 16   CREATININE 1.07 0.98 1.07 0.96 1.00  CALCIUM 7.8* 8.1* 8.2* 8.1* 8.4*  MG  --   --  2.1  --   --   PHOS  --   --  3.6  --   --     GFR: Estimated Creatinine Clearance: 60.3 mL/min (by C-G formula based on SCr of 1 mg/dL).  Liver Function Tests: No results for input(s): AST, ALT, ALKPHOS, BILITOT, PROT, ALBUMIN in the last 168 hours. No results for input(s): LIPASE, AMYLASE in the last 168 hours. No results for input(s): AMMONIA in the last 168  hours.  Coagulation Profile: No results for input(s): INR, PROTIME in the last 168 hours.  Cardiac Enzymes: No results for input(s): CKTOTAL, CKMB, CKMBINDEX, TROPONINI in the last 168 hours.  BNP (last 3 results) No results for input(s): PROBNP in the last 8760 hours.  HbA1C: No results for input(s): HGBA1C in the last 72 hours.  CBG: No results for input(s): GLUCAP in the last 168 hours.  Lipid Profile: No results for input(s): CHOL, HDL, LDLCALC, TRIG, CHOLHDL, LDLDIRECT in the last 72 hours.  Thyroid Function Tests:  No results for input(s): TSH, T4TOTAL, FREET4, T3FREE, THYROIDAB in the last 72 hours.  Anemia Panel: No results for input(s): VITAMINB12, FOLATE, FERRITIN, TIBC, IRON, RETICCTPCT in the last 72 hours.  Urine analysis: No results found for: COLORURINE, APPEARANCEUR, LABSPEC, PHURINE, GLUCOSEU, HGBUR, BILIRUBINUR, KETONESUR, PROTEINUR, UROBILINOGEN, NITRITE, LEUKOCYTESUR  Sepsis Labs: Lactic Acid, Venous    Component Value Date/Time   LATICACIDVEN 0.93 01/31/2017 1122    MICROBIOLOGY: Recent Results (from the past 240 hour(s))  Culture, blood (routine x 2) Call MD if unable to obtain prior to antibiotics being given     Status: None   Collection Time: 01/31/17  1:00 PM  Result Value Ref Range Status   Specimen Description BLOOD RIGHT ANTECUBITAL  Final   Special Requests   Final    BOTTLES DRAWN AEROBIC AND ANAEROBIC 10CC AER 5CC ANA   Culture NO GROWTH 5 DAYS  Final   Report Status 02/05/2017 FINAL  Final  Culture, blood (routine x 2) Call MD if unable to obtain prior to antibiotics being given     Status: None   Collection Time: 01/31/17  1:14 PM  Result Value Ref Range Status   Specimen Description BLOOD RIGHT HAND  Final   Special Requests BOTTLES DRAWN AEROBIC ONLY 10CC  Final   Culture NO GROWTH 5 DAYS  Final   Report Status 02/05/2017 FINAL  Final  MRSA PCR Screening     Status: None   Collection Time: 02/02/17 10:26 AM  Result Value Ref  Range Status   MRSA by PCR NEGATIVE NEGATIVE Final    Comment:        The GeneXpert MRSA Assay (FDA approved for NASAL specimens only), is one component of a comprehensive MRSA colonization surveillance program. It is not intended to diagnose MRSA infection nor to guide or monitor treatment for MRSA infections.     RADIOLOGY STUDIES/RESULTS: Dg Chest 2 View  Result Date: 01/31/2017 CLINICAL DATA:  Generalized chest pain, shortness of breath. The patient is mentally challenged. EXAM: CHEST  2 VIEW COMPARISON:  Chest x-ray of July 04, 2013 and chest CT scan of July 05, 2013 FINDINGS: The lungs are adequately inflated. New abnormal interstitial and alveolar opacity is present at the lung bases greatest on the left. No definite pleural effusion is observed. The heart borders are obscured. The pulmonary vascularity is not clearly engorged. There is calcification in the wall of the aortic arch. There is multilevel degenerative disc disease of the visualized portions of the thoracic spine. IMPRESSION: Findings worrisome for lingular and right middle and anterior lower lobe pneumonia. No definite pulmonary edema or pleural effusion. Followup PA and lateral chest X-ray is recommended in 3-4 weeks following trial of antibiotic therapy to ensure resolution and exclude underlying malignancy. Thoracic aortic atherosclerosis. Electronically Signed   By: David  [place] M.D.   On: 01/31/2017 10:31   Dg Chest Port 1 View  Result Date: 02/06/2017 CLINICAL DATA:  61 year old male with chest pain, shortness of breath, hypoxia. Initial encounter. EXAM: PORTABLE CHEST 1 VIEW COMPARISON:  02/05/2017 and earlier. FINDINGS: Portable AP semi upright view at 0329 hours. Lower lung volumes compared to yesterday. Patchy and nodular bibasilar pulmonary opacity persists, progressed on the left since 02/04/2017. There is mild involvement of the right upper lobe. The left upper lung remain spared. No pneumothorax or pulmonary  edema. Calcified aortic atherosclerosis. Grossly stable mediastinal contours. No definite pleural effusion. Negative visible bowel gas pattern. IMPRESSION: Lower lung volumes compared the yesterday. Multifocal bilateral pneumonia suspected  with interval worsening ventilation at the left lung base. Electronically Signed   By: Odessa FlemingH  Hall M.D.   On: 02/06/2017 06:53   Dg Chest Port 1 View  Result Date: 02/05/2017 CLINICAL DATA:  61 year old with acute hypoxia. EXAM: PORTABLE CHEST 1 VIEW COMPARISON:  02/04/2017, 01/31/2017 and earlier, including CTA chest 07/05/2013. FINDINGS: Suboptimal inspiration. Cardiac silhouette mildly to moderately enlarged for technique, unchanged. New patchy opacities in the right upper lobe and at the left lung base since yesterday. Persistent patchy airspace opacities at the right lung base. Small right pleural effusion and possible small left pleural effusion, unchanged. Pulmonary vascularity normal. IMPRESSION: 1. Progressive pneumonia since yesterday, with new involvement of the right upper lobe a into the left lung base and stable involvement of the right lung base. 2. Stable small right pleural effusion a possible small left pleural effusion. Electronically Signed   By: Hulan Saashomas  Lawrence M.D.   On: 02/05/2017 07:46   Dg Chest Port 1 View  Result Date: 02/04/2017 CLINICAL DATA:  Hypoxia EXAM: PORTABLE CHEST 1 VIEW COMPARISON:  01/31/2017 chest radiograph. FINDINGS: Right rotated chest radiograph. Stable cardiomediastinal silhouette with mild cardiomegaly and aortic atherosclerosis. No pneumothorax. Small right pleural effusion appears new. No left pleural effusion. No pulmonary edema. Patchy opacities at the right greater than left lung bases, not appreciably changed. IMPRESSION: 1. No appreciable change in patchy bibasilar lung opacities, right greater the left, which could represent atelectasis, pneumonia and/or aspiration. 2. New small right pleural effusion. 3. Mild cardiomegaly  without pulmonary edema. 4. Aortic atherosclerosis. Electronically Signed   By: Delbert PhenixJason A Poff M.D.   On: 02/04/2017 08:01     LOS: 8 days   Jeoffrey MassedGHIMIRE,Brocha Gilliam, MD  [facility] Pager:[phone]  If 7PM-7AM, please contact night-coverage [email or web address] Password [email or web address] 02/09/2017, 1:48 PM

## 2017-02-10 MED ORDER — BISACODYL 10 MG RE SUPP
10.0000 mg | Freq: Every day | RECTAL | Status: DC | PRN
  Filled 2017-02-10: qty 1

## 2017-02-10 MED ORDER — SENNOSIDES-DOCUSATE SODIUM 8.6-50 MG PO TABS: 2.0000 | ORAL_TABLET | Freq: Every evening | ORAL | Status: DC | PRN

## 2017-02-10 NOTE — Progress Notes (Signed)
Pt bowel movement last 02/05/17 given Miralax yesterday no bowel movement in night shift endorsed to morning shift to be given another dose of Miralax.

## 2017-02-10 NOTE — Progress Notes (Signed)
As of 3/11 AM patient still does not have PASRR number to DC to facility. CSW will continue to check for PASRR number.   Stacy GardnerErin Rontae Inglett, LCSWA Clinical Social Worker [phone]

## 2017-02-10 NOTE — Progress Notes (Signed)
PROGRESS NOTE        PATIENT DETAILS Name: Travis Sampson Age: 61 y.o. Sex: male Date of Birth: 06/08/1956 Admit Date: 01/31/2017 Admitting Physician Haydee Salter, MD [id number] Hetty Ely, MD  Brief Narrative: Patient is a 61 y.o. male Downs syndrome, dyslipidemia, hypothyroidism admitted on 3/1 with influenza/pneumonitis, hospital course complicated by worsening hypoxic respiratory failure requiring transfer to stepdown. Slowly improving with supportive measures. See below for further details.  Subjective: Lying comfortably in bed. Per hour and no major issues overnight.  Assessment/Plan: Acute hypoxemic respiratory failure: Secondary to influenza and probable bacterial pneumonia. Oxygen been slowly tapered down, not longer requiring BiPAP.    Influenza with pneumonitis: Has completed a course of Tamiflu and Rocephin. Improved-afebrile-decreasing O2 requirement.Blood cx negative.   Dyslipidemia: Continue statin  Hypothyroidism: Continue Synthroid-dosage increased to 75 g, recheck TSH in 3 months  History of down syndrome: Apparently current mental status is close to his baseline. Continue Namenda, Aricept, Seroquel and Depakote.  DVT Prophylaxis: Prophylactic Heparin   Code Status: Full code  Family Communication: None at bedside  Disposition Plan: Remain inpatient- SNF on discharge when bed available  Antimicrobial agents: Anti-infectives    Start     Dose/Rate Route Frequency Ordered Stop   02/02/17 1300  azithromycin (ZITHROMAX) tablet 500 mg     500 mg Oral Daily 02/02/17 1221 02/04/17 1126   02/01/17 1200  azithromycin (ZITHROMAX) 500 mg in dextrose 5 % 250 mL IVPB  Status:  Discontinued     500 mg 250 mL/hr over 60 Minutes Intravenous Every 24 hours 01/31/17 1230 02/02/17 1221   02/01/17 1030  cefTRIAXone (ROCEPHIN) 1 g in dextrose 5 % 50 mL IVPB     1 g 100 mL/hr over 30 Minutes Intravenous Every 24 hours 01/31/17 1230 02/07/17  0941   01/31/17 1245  oseltamivir (TAMIFLU) capsule 75 mg  Status:  Discontinued     75 mg Oral 2 times daily 01/31/17 1230 02/06/17 1345   01/31/17 1130  azithromycin (ZITHROMAX) 500 mg in dextrose 5 % 250 mL IVPB     500 mg 250 mL/hr over 60 Minutes Intravenous  Once 01/31/17 1119 01/31/17 1317   01/31/17 1130  oseltamivir (TAMIFLU) capsule 75 mg     75 mg Oral  Once 01/31/17 1128 01/31/17 1217   01/31/17 1045  cefTRIAXone (ROCEPHIN) 1 g in dextrose 5 % 50 mL IVPB     1 g 100 mL/hr over 30 Minutes Intravenous  Once 01/31/17 1042 01/31/17 1117   01/31/17 1045  azithromycin (ZITHROMAX) 500 mg in dextrose 5 % 250 mL IVPB  Status:  Discontinued     500 mg 250 mL/hr over 60 Minutes Intravenous  Once 01/31/17 1042 01/31/17 1059      Procedures: None  CONSULTS:  None  Time spent: 25 minutes-Greater than 50% of this time was spent in counseling, explanation of diagnosis, planning of further management, and coordination of care.  MEDICATIONS: Scheduled Meds: . aspirin EC  81 mg Oral Daily  . chlorhexidine  15 mL Mouth/Throat BID  . chlorhexidine  15 mL Mouth Rinse BID  . divalproex  750 mg Oral Daily  . donepezil  10 mg Oral QHS  . furosemide  40 mg Oral Daily  . guaiFENesin  600 mg Oral BID  . heparin  5,000 Units Subcutaneous Q8H  . latanoprost  1 drop Both Eyes QHS  . levothyroxine  75 mcg Oral QAC breakfast  . mouth rinse  15 mL Mouth Rinse q12n4p  . memantine  10 mg Oral BID  . multivitamin with minerals  1 tablet Oral Daily  . QUEtiapine  50 mg Oral QHS  . sertraline  50 mg Oral Daily  . simvastatin  20 mg Oral q1800   Continuous Infusions: PRN Meds:.acetaminophen **OR** acetaminophen, bisacodyl, guaiFENesin-dextromethorphan, ipratropium-albuterol, ondansetron **OR** ondansetron (ZOFRAN) IV, polyethylene glycol, senna-docusate   PHYSICAL EXAM: Vital signs: Vitals:   02/09/17 0546 02/09/17 1306 02/09/17 2100 02/10/17 0503  BP: 97/83 115/67 109/71 116/60  Pulse: 60  62 62 60  Resp: 17 18 17 18   Temp: 98 F (36.7 C) 98.4 F (36.9 C) 98.2 F (36.8 C) 98 F (36.7 C)  TempSrc: Oral Oral Axillary Axillary  SpO2: 90% 90% 95% 98%  Weight:    64.5 kg (142 lb 3.2 oz)  Height:       Filed Weights   02/08/17 0500 02/09/17 0500 02/10/17 0503  Weight: 64.8 kg (142 lb 13.7 oz) 64.3 kg (141 lb 12.1 oz) 64.5 kg (142 lb 3.2 oz)   Body mass index is 27.77 kg/m.   General appearance :Awake, alert, not in any distress.  Eyes:, pupils equally reactive to light and accomodation HEENT: Atraumatic and Normocephalic Neck: supple, no JVD.  Resp:Good air entry bilaterally, no added sounds anteriorly CVS: S1 S2 regular, no murmurs.  GI: Bowel sounds present, Non tender and not distended with no gaurding, rigidity or rebound. Extremities: B/L Lower Ext shows no edema, both legs are warm to touch Neurology:  speech clear,Non focal, sensation is grossly intact. Musculoskeletal:No digital cyanosis Skin:No Rash, warm and dry Wounds:N/A  I have personally reviewed following labs and imaging studies  LABORATORY DATA: CBC:  Recent Labs Lab 02/04/17 0413 02/05/17 0258 02/06/17 0358  WBC 4.4 4.7 4.2  HGB 13.1 13.8 15.1  HCT 38.5* 41.0 43.8  MCV 107.5* 107.6* 107.4*  PLT 121* 147* 162    Basic Metabolic Panel:  Recent Labs Lab 02/04/17 0413 02/05/17 0258 02/06/17 0358 02/07/17 0625  NA 139 140 139 141  K 3.7 3.9 4.1 3.3*  CL 103 101 101 96*  CO2 28 32 27 37*  GLUCOSE 78 88 88 94  BUN 12 14 19 16   CREATININE 0.98 1.07 0.96 1.00  CALCIUM 8.1* 8.2* 8.1* 8.4*  MG  --  2.1  --   --   PHOS  --  3.6  --   --     GFR: Estimated Creatinine Clearance: 60.5 mL/min (by C-G formula based on SCr of 1 mg/dL).  Liver Function Tests: No results for input(s): AST, ALT, ALKPHOS, BILITOT, PROT, ALBUMIN in the last 168 hours. No results for input(s): LIPASE, AMYLASE in the last 168 hours. No results for input(s): AMMONIA in the last 168 hours.  Coagulation  Profile: No results for input(s): INR, PROTIME in the last 168 hours.  Cardiac Enzymes: No results for input(s): CKTOTAL, CKMB, CKMBINDEX, TROPONINI in the last 168 hours.  BNP (last 3 results) No results for input(s): PROBNP in the last 8760 hours.  HbA1C: No results for input(s): HGBA1C in the last 72 hours.  CBG: No results for input(s): GLUCAP in the last 168 hours.  Lipid Profile: No results for input(s): CHOL, HDL, LDLCALC, TRIG, CHOLHDL, LDLDIRECT in the last 72 hours.  Thyroid Function Tests: No results for input(s): TSH, T4TOTAL, FREET4, T3FREE, THYROIDAB in the last 72 hours.  Anemia Panel: No results for input(s): VITAMINB12, FOLATE, FERRITIN, TIBC, IRON, RETICCTPCT in the last 72 hours.  Urine analysis: No results found for: COLORURINE, APPEARANCEUR, LABSPEC, PHURINE, GLUCOSEU, HGBUR, BILIRUBINUR, KETONESUR, PROTEINUR, UROBILINOGEN, NITRITE, LEUKOCYTESUR  Sepsis Labs: Lactic Acid, Venous    Component Value Date/Time   LATICACIDVEN 0.93 01/31/2017 1122    MICROBIOLOGY: Recent Results (from the past 240 hour(s))  Culture, blood (routine x 2) Call MD if unable to obtain prior to antibiotics being given     Status: None   Collection Time: 01/31/17  1:00 PM  Result Value Ref Range Status   Specimen Description BLOOD RIGHT ANTECUBITAL  Final   Special Requests   Final    BOTTLES DRAWN AEROBIC AND ANAEROBIC 10CC AER 5CC ANA   Culture NO GROWTH 5 DAYS  Final   Report Status 02/05/2017 FINAL  Final  Culture, blood (routine x 2) Call MD if unable to obtain prior to antibiotics being given     Status: None   Collection Time: 01/31/17  1:14 PM  Result Value Ref Range Status   Specimen Description BLOOD RIGHT HAND  Final   Special Requests BOTTLES DRAWN AEROBIC ONLY 10CC  Final   Culture NO GROWTH 5 DAYS  Final   Report Status 02/05/2017 FINAL  Final  MRSA PCR Screening     Status: None   Collection Time: 02/02/17 10:26 AM  Result Value Ref Range Status   MRSA by  PCR NEGATIVE NEGATIVE Final    Comment:        The GeneXpert MRSA Assay (FDA approved for NASAL specimens only), is one component of a comprehensive MRSA colonization surveillance program. It is not intended to diagnose MRSA infection nor to guide or monitor treatment for MRSA infections.     RADIOLOGY STUDIES/RESULTS: Dg Chest 2 View  Result Date: 01/31/2017 CLINICAL DATA:  Generalized chest pain, shortness of breath. The patient is mentally challenged. EXAM: CHEST  2 VIEW COMPARISON:  Chest x-ray of July 04, 2013 and chest CT scan of July 05, 2013 FINDINGS: The lungs are adequately inflated. New abnormal interstitial and alveolar opacity is present at the lung bases greatest on the left. No definite pleural effusion is observed. The heart borders are obscured. The pulmonary vascularity is not clearly engorged. There is calcification in the wall of the aortic arch. There is multilevel degenerative disc disease of the visualized portions of the thoracic spine. IMPRESSION: Findings worrisome for lingular and right middle and anterior lower lobe pneumonia. No definite pulmonary edema or pleural effusion. Followup PA and lateral chest X-ray is recommended in 3-4 weeks following trial of antibiotic therapy to ensure resolution and exclude underlying malignancy. Thoracic aortic atherosclerosis. Electronically Signed   By: David  [place] M.D.   On: 01/31/2017 10:31   Dg Chest Port 1 View  Result Date: 02/06/2017 CLINICAL DATA:  61 year old male with chest pain, shortness of breath, hypoxia. Initial encounter. EXAM: PORTABLE CHEST 1 VIEW COMPARISON:  02/05/2017 and earlier. FINDINGS: Portable AP semi upright view at 0329 hours. Lower lung volumes compared to yesterday. Patchy and nodular bibasilar pulmonary opacity persists, progressed on the left since 02/04/2017. There is mild involvement of the right upper lobe. The left upper lung remain spared. No pneumothorax or pulmonary edema. Calcified aortic  atherosclerosis. Grossly stable mediastinal contours. No definite pleural effusion. Negative visible bowel gas pattern. IMPRESSION: Lower lung volumes compared the yesterday. Multifocal bilateral pneumonia suspected with interval worsening ventilation at the left lung base. Electronically Signed   By: Rexene Edison  Margo Aye M.D.   On: 02/06/2017 06:53   Dg Chest Port 1 View  Result Date: 02/05/2017 CLINICAL DATA:  61 year old with acute hypoxia. EXAM: PORTABLE CHEST 1 VIEW COMPARISON:  02/04/2017, 01/31/2017 and earlier, including CTA chest 07/05/2013. FINDINGS: Suboptimal inspiration. Cardiac silhouette mildly to moderately enlarged for technique, unchanged. New patchy opacities in the right upper lobe and at the left lung base since yesterday. Persistent patchy airspace opacities at the right lung base. Small right pleural effusion and possible small left pleural effusion, unchanged. Pulmonary vascularity normal. IMPRESSION: 1. Progressive pneumonia since yesterday, with new involvement of the right upper lobe a into the left lung base and stable involvement of the right lung base. 2. Stable small right pleural effusion a possible small left pleural effusion. Electronically Signed   By: Hulan Saas M.D.   On: 02/05/2017 07:46   Dg Chest Port 1 View  Result Date: 02/04/2017 CLINICAL DATA:  Hypoxia EXAM: PORTABLE CHEST 1 VIEW COMPARISON:  01/31/2017 chest radiograph. FINDINGS: Right rotated chest radiograph. Stable cardiomediastinal silhouette with mild cardiomegaly and aortic atherosclerosis. No pneumothorax. Small right pleural effusion appears new. No left pleural effusion. No pulmonary edema. Patchy opacities at the right greater than left lung bases, not appreciably changed. IMPRESSION: 1. No appreciable change in patchy bibasilar lung opacities, right greater the left, which could represent atelectasis, pneumonia and/or aspiration. 2. New small right pleural effusion. 3. Mild cardiomegaly without pulmonary edema.  4. Aortic atherosclerosis. Electronically Signed   By: Delbert Phenix M.D.   On: 02/04/2017 08:01     LOS: 9 days   Jeoffrey Massed, MD  [facility] Pager:[phone]  If 7PM-7AM, please contact night-coverage [email or web address] Password [facility] 02/10/2017, 11:27 AM

## 2017-02-11 DIAGNOSIS — M6281 Muscle weakness (generalized): Secondary | ICD-10-CM | POA: Diagnosis not present

## 2017-02-11 DIAGNOSIS — E039 Hypothyroidism, unspecified: Secondary | ICD-10-CM | POA: Diagnosis not present

## 2017-02-11 DIAGNOSIS — R2681 Unsteadiness on feet: Secondary | ICD-10-CM | POA: Diagnosis not present

## 2017-02-11 DIAGNOSIS — K056 Periodontal disease, unspecified: Secondary | ICD-10-CM | POA: Diagnosis not present

## 2017-02-11 DIAGNOSIS — F0150 Vascular dementia without behavioral disturbance: Secondary | ICD-10-CM | POA: Diagnosis not present

## 2017-02-11 DIAGNOSIS — E785 Hyperlipidemia, unspecified: Secondary | ICD-10-CM | POA: Diagnosis not present

## 2017-02-11 DIAGNOSIS — R262 Difficulty in walking, not elsewhere classified: Secondary | ICD-10-CM | POA: Diagnosis not present

## 2017-02-11 DIAGNOSIS — E569 Vitamin deficiency, unspecified: Secondary | ICD-10-CM | POA: Diagnosis not present

## 2017-02-11 DIAGNOSIS — R278 Other lack of coordination: Secondary | ICD-10-CM | POA: Diagnosis not present

## 2017-02-11 DIAGNOSIS — J159 Unspecified bacterial pneumonia: Secondary | ICD-10-CM | POA: Diagnosis not present

## 2017-02-11 DIAGNOSIS — J1189 Influenza due to unidentified influenza virus with other manifestations: Secondary | ICD-10-CM | POA: Diagnosis not present

## 2017-02-11 DIAGNOSIS — R1312 Dysphagia, oropharyngeal phase: Secondary | ICD-10-CM | POA: Diagnosis not present

## 2017-02-11 DIAGNOSIS — F064 Anxiety disorder due to known physiological condition: Secondary | ICD-10-CM | POA: Diagnosis not present

## 2017-02-11 DIAGNOSIS — T7840XS Allergy, unspecified, sequela: Secondary | ICD-10-CM | POA: Diagnosis not present

## 2017-02-11 DIAGNOSIS — F0630 Mood disorder due to known physiological condition, unspecified: Secondary | ICD-10-CM | POA: Diagnosis not present

## 2017-02-11 DIAGNOSIS — H40219 Acute angle-closure glaucoma, unspecified eye: Secondary | ICD-10-CM | POA: Diagnosis not present

## 2017-02-11 DIAGNOSIS — Q909 Down syndrome, unspecified: Secondary | ICD-10-CM | POA: Diagnosis not present

## 2017-02-11 DIAGNOSIS — Z66 Do not resuscitate: Secondary | ICD-10-CM | POA: Diagnosis not present

## 2017-02-11 DIAGNOSIS — H01009 Unspecified blepharitis unspecified eye, unspecified eyelid: Secondary | ICD-10-CM | POA: Diagnosis not present

## 2017-02-11 DIAGNOSIS — J189 Pneumonia, unspecified organism: Secondary | ICD-10-CM | POA: Diagnosis not present

## 2017-02-11 DIAGNOSIS — R41841 Cognitive communication deficit: Secondary | ICD-10-CM | POA: Diagnosis not present

## 2017-02-11 DIAGNOSIS — G609 Hereditary and idiopathic neuropathy, unspecified: Secondary | ICD-10-CM | POA: Diagnosis not present

## 2017-02-11 DIAGNOSIS — G608 Other hereditary and idiopathic neuropathies: Secondary | ICD-10-CM | POA: Diagnosis not present

## 2017-02-11 DIAGNOSIS — K5900 Constipation, unspecified: Secondary | ICD-10-CM | POA: Diagnosis not present

## 2017-02-11 MED ORDER — BISACODYL 10 MG RE SUPP: 10.0000 mg | Freq: Every day | RECTAL | 0 refills | Status: AC | PRN

## 2017-02-11 MED ORDER — SENNOSIDES-DOCUSATE SODIUM 8.6-50 MG PO TABS: 2.0000 | ORAL_TABLET | Freq: Every evening | ORAL | Status: AC | PRN

## 2017-02-11 MED ORDER — POLYETHYLENE GLYCOL 3350 17 G PO PACK: 17.0000 g | PACK | Freq: Every day | ORAL | 0 refills | Status: AC

## 2017-02-11 NOTE — Progress Notes (Signed)
PROGRESS NOTE        PATIENT DETAILS Name: Travis Sampson Age: 61 y.o. Sex: male Date of Birth: 11-05-1956 Admit Date: 01/31/2017 Admitting Physician Haydee Salter, MD [id number] Hetty Ely, MD  Brief Narrative: Patient is a 61 y.o. male Downs syndrome, dyslipidemia, hypothyroidism admitted on 3/1 with influenza/pneumonitis, hospital course complicated by worsening hypoxic respiratory failure requiring transfer to stepdown. Slowly improving with supportive measures. See below for further details.  Subjective: Lying comfortably in bed. Per RN had BM yesterday  Assessment/Plan: Acute hypoxemic respiratory failure: Secondary to influenza and probable bacterial pneumonia. Oxygen been slowly tapered down, not longer requiring BiPAP.  Lying comfortably in bed.   Influenza with pneumonitis: Has completed a course of Tamiflu and Rocephin. Improved-afebrile-decreasing O2 requirement.Blood cx negative.   Dyslipidemia: Continue statin  Hypothyroidism: Continue Synthroid-dosage increased to 75 g, recheck TSH in 3 months  History of down syndrome: Apparently current mental status is close to his baseline. Continue Namenda, Aricept, Seroquel and Depakote.  Constipation:resolved-had BM yesterday  DVT Prophylaxis: Prophylactic Heparin   Code Status: Full code  Family Communication: None at bedside  Disposition Plan: Remain inpatient- SNF on discharge when bed available  Antimicrobial agents: Anti-infectives    Start     Dose/Rate Route Frequency Ordered Stop   02/02/17 1300  azithromycin (ZITHROMAX) tablet 500 mg     500 mg Oral Daily 02/02/17 1221 02/04/17 1126   02/01/17 1200  azithromycin (ZITHROMAX) 500 mg in dextrose 5 % 250 mL IVPB  Status:  Discontinued     500 mg 250 mL/hr over 60 Minutes Intravenous Every 24 hours 01/31/17 1230 02/02/17 1221   02/01/17 1030  cefTRIAXone (ROCEPHIN) 1 g in dextrose 5 % 50 mL IVPB     1 g 100 mL/hr over 30 Minutes  Intravenous Every 24 hours 01/31/17 1230 02/07/17 0941   01/31/17 1245  oseltamivir (TAMIFLU) capsule 75 mg  Status:  Discontinued     75 mg Oral 2 times daily 01/31/17 1230 02/06/17 1345   01/31/17 1130  azithromycin (ZITHROMAX) 500 mg in dextrose 5 % 250 mL IVPB     500 mg 250 mL/hr over 60 Minutes Intravenous  Once 01/31/17 1119 01/31/17 1317   01/31/17 1130  oseltamivir (TAMIFLU) capsule 75 mg     75 mg Oral  Once 01/31/17 1128 01/31/17 1217   01/31/17 1045  cefTRIAXone (ROCEPHIN) 1 g in dextrose 5 % 50 mL IVPB     1 g 100 mL/hr over 30 Minutes Intravenous  Once 01/31/17 1042 01/31/17 1117   01/31/17 1045  azithromycin (ZITHROMAX) 500 mg in dextrose 5 % 250 mL IVPB  Status:  Discontinued     500 mg 250 mL/hr over 60 Minutes Intravenous  Once 01/31/17 1042 01/31/17 1059      Procedures: None  CONSULTS:  None  Time spent: 25 minutes-Greater than 50% of this time was spent in counseling, explanation of diagnosis, planning of further management, and coordination of care.  MEDICATIONS: Scheduled Meds: . aspirin EC  81 mg Oral Daily  . chlorhexidine  15 mL Mouth/Throat BID  . chlorhexidine  15 mL Mouth Rinse BID  . divalproex  750 mg Oral Daily  . donepezil  10 mg Oral QHS  . furosemide  40 mg Oral Daily  . guaiFENesin  600 mg Oral BID  . heparin  5,000 Units  Subcutaneous Q8H  . latanoprost  1 drop Both Eyes QHS  . levothyroxine  75 mcg Oral QAC breakfast  . mouth rinse  15 mL Mouth Rinse q12n4p  . memantine  10 mg Oral BID  . multivitamin with minerals  1 tablet Oral Daily  . QUEtiapine  50 mg Oral QHS  . sertraline  50 mg Oral Daily  . simvastatin  20 mg Oral q1800   Continuous Infusions: PRN Meds:.acetaminophen **OR** acetaminophen, bisacodyl, guaiFENesin-dextromethorphan, ipratropium-albuterol, ondansetron **OR** ondansetron (ZOFRAN) IV, polyethylene glycol, senna-docusate   PHYSICAL EXAM: Vital signs: Vitals:   02/10/17 0503 02/10/17 1307 02/10/17 2106 02/11/17  0457  BP: 116/60 113/71 133/66 129/78  Pulse: 60 63 (!) 58 78  Resp: 18 18 18 18   Temp: 98 F (36.7 C) 97.9 F (36.6 C) 98 F (36.7 C) 97.5 F (36.4 C)  TempSrc: Axillary Oral Oral Axillary  SpO2: 98% 94% 94% 92%  Weight: 64.5 kg (142 lb 3.2 oz)   64.6 kg (142 lb 6.7 oz)  Height:       Filed Weights   02/09/17 0500 02/10/17 0503 02/11/17 0457  Weight: 64.3 kg (141 lb 12.1 oz) 64.5 kg (142 lb 3.2 oz) 64.6 kg (142 lb 6.7 oz)   Body mass index is 27.81 kg/m.   General appearance :Awake, alert, not in any distress.  Eyes:, pupils equally reactive to light and accomodation HEENT: Atraumatic and Normocephalic Neck: supple, no JVD.  Resp:Good air entry bilaterally, no added sounds anteriorly CVS: S1 S2 regular, no murmurs.  GI: Bowel sounds present, Non tender and not distended with no gaurding, rigidity or rebound. Extremities: B/L Lower Ext shows no edema, both legs are warm to touch Neurology:  speech clear,Non focal, sensation is grossly intact. Musculoskeletal:No digital cyanosis Skin:No Rash, warm and dry Wounds:N/A  I have personally reviewed following labs and imaging studies  LABORATORY DATA: CBC:  Recent Labs Lab 02/05/17 0258 02/06/17 0358  WBC 4.7 4.2  HGB 13.8 15.1  HCT 41.0 43.8  MCV 107.6* 107.4*  PLT 147* 162    Basic Metabolic Panel:  Recent Labs Lab 02/05/17 0258 02/06/17 0358 02/07/17 0625  NA 140 139 141  K 3.9 4.1 3.3*  CL 101 101 96*  CO2 32 27 37*  GLUCOSE 88 88 94  BUN 14 19 16   CREATININE 1.07 0.96 1.00  CALCIUM 8.2* 8.1* 8.4*  MG 2.1  --   --   PHOS 3.6  --   --     GFR: Estimated Creatinine Clearance: 60.5 mL/min (by C-G formula based on SCr of 1 mg/dL).  Liver Function Tests: No results for input(s): AST, ALT, ALKPHOS, BILITOT, PROT, ALBUMIN in the last 168 hours. No results for input(s): LIPASE, AMYLASE in the last 168 hours. No results for input(s): AMMONIA in the last 168 hours.  Coagulation Profile: No results for  input(s): INR, PROTIME in the last 168 hours.  Cardiac Enzymes: No results for input(s): CKTOTAL, CKMB, CKMBINDEX, TROPONINI in the last 168 hours.  BNP (last 3 results) No results for input(s): PROBNP in the last 8760 hours.  HbA1C: No results for input(s): HGBA1C in the last 72 hours.  CBG: No results for input(s): GLUCAP in the last 168 hours.  Lipid Profile: No results for input(s): CHOL, HDL, LDLCALC, TRIG, CHOLHDL, LDLDIRECT in the last 72 hours.  Thyroid Function Tests: No results for input(s): TSH, T4TOTAL, FREET4, T3FREE, THYROIDAB in the last 72 hours.  Anemia Panel: No results for input(s): VITAMINB12, FOLATE, FERRITIN, TIBC,  IRON, RETICCTPCT in the last 72 hours.  Urine analysis: No results found for: COLORURINE, APPEARANCEUR, LABSPEC, PHURINE, GLUCOSEU, HGBUR, BILIRUBINUR, KETONESUR, PROTEINUR, UROBILINOGEN, NITRITE, LEUKOCYTESUR  Sepsis Labs: Lactic Acid, Venous    Component Value Date/Time   LATICACIDVEN 0.93 01/31/2017 1122    MICROBIOLOGY: Recent Results (from the past 240 hour(s))  MRSA PCR Screening     Status: None   Collection Time: 02/02/17 10:26 AM  Result Value Ref Range Status   MRSA by PCR NEGATIVE NEGATIVE Final    Comment:        The GeneXpert MRSA Assay (FDA approved for NASAL specimens only), is one component of a comprehensive MRSA colonization surveillance program. It is not intended to diagnose MRSA infection nor to guide or monitor treatment for MRSA infections.     RADIOLOGY STUDIES/RESULTS: Dg Chest 2 View  Result Date: 01/31/2017 CLINICAL DATA:  Generalized chest pain, shortness of breath. The patient is mentally challenged. EXAM: CHEST  2 VIEW COMPARISON:  Chest x-ray of July 04, 2013 and chest CT scan of July 05, 2013 FINDINGS: The lungs are adequately inflated. New abnormal interstitial and alveolar opacity is present at the lung bases greatest on the left. No definite pleural effusion is observed. The heart borders are  obscured. The pulmonary vascularity is not clearly engorged. There is calcification in the wall of the aortic arch. There is multilevel degenerative disc disease of the visualized portions of the thoracic spine. IMPRESSION: Findings worrisome for lingular and right middle and anterior lower lobe pneumonia. No definite pulmonary edema or pleural effusion. Followup PA and lateral chest X-ray is recommended in 3-4 weeks following trial of antibiotic therapy to ensure resolution and exclude underlying malignancy. Thoracic aortic atherosclerosis. Electronically Signed   By: David  [place] M.D.   On: 01/31/2017 10:31   Dg Chest Port 1 View  Result Date: 02/06/2017 CLINICAL DATA:  61 year old male with chest pain, shortness of breath, hypoxia. Initial encounter. EXAM: PORTABLE CHEST 1 VIEW COMPARISON:  02/05/2017 and earlier. FINDINGS: Portable AP semi upright view at 0329 hours. Lower lung volumes compared to yesterday. Patchy and nodular bibasilar pulmonary opacity persists, progressed on the left since 02/04/2017. There is mild involvement of the right upper lobe. The left upper lung remain spared. No pneumothorax or pulmonary edema. Calcified aortic atherosclerosis. Grossly stable mediastinal contours. No definite pleural effusion. Negative visible bowel gas pattern. IMPRESSION: Lower lung volumes compared the yesterday. Multifocal bilateral pneumonia suspected with interval worsening ventilation at the left lung base. Electronically Signed   By: Odessa Fleming M.D.   On: 02/06/2017 06:53   Dg Chest Port 1 View  Result Date: 02/05/2017 CLINICAL DATA:  61 year old with acute hypoxia. EXAM: PORTABLE CHEST 1 VIEW COMPARISON:  02/04/2017, 01/31/2017 and earlier, including CTA chest 07/05/2013. FINDINGS: Suboptimal inspiration. Cardiac silhouette mildly to moderately enlarged for technique, unchanged. New patchy opacities in the right upper lobe and at the left lung base since yesterday. Persistent patchy airspace opacities  at the right lung base. Small right pleural effusion and possible small left pleural effusion, unchanged. Pulmonary vascularity normal. IMPRESSION: 1. Progressive pneumonia since yesterday, with new involvement of the right upper lobe a into the left lung base and stable involvement of the right lung base. 2. Stable small right pleural effusion a possible small left pleural effusion. Electronically Signed   By: Hulan Saas M.D.   On: 02/05/2017 07:46   Dg Chest Port 1 View  Result Date: 02/04/2017 CLINICAL DATA:  Hypoxia EXAM: PORTABLE CHEST 1 VIEW  COMPARISON:  01/31/2017 chest radiograph. FINDINGS: Right rotated chest radiograph. Stable cardiomediastinal silhouette with mild cardiomegaly and aortic atherosclerosis. No pneumothorax. Small right pleural effusion appears new. No left pleural effusion. No pulmonary edema. Patchy opacities at the right greater than left lung bases, not appreciably changed. IMPRESSION: 1. No appreciable change in patchy bibasilar lung opacities, right greater the left, which could represent atelectasis, pneumonia and/or aspiration. 2. New small right pleural effusion. 3. Mild cardiomegaly without pulmonary edema. 4. Aortic atherosclerosis. Electronically Signed   By: Delbert PhenixJason A Poff M.D.   On: 02/04/2017 08:01     LOS: 10 days   Jeoffrey MassedGHIMIRE,Oluwadarasimi Favor, MD  [facility] Pager:[phone]  If 7PM-7AM, please contact night-coverage [email or web address] Password [email or web address] 02/11/2017, 9:47 AM

## 2017-02-11 NOTE — Progress Notes (Signed)
[facility] No questions/complaints.

## 2017-02-11 NOTE — Progress Notes (Signed)
Pt. Transported via [facility].

## 2017-02-11 NOTE — [place] FL2 (Signed)
[place] MEDICAID FL2 LEVEL OF CARE SCREENING TOOL     IDENTIFICATION  Patient Name: Travis Sampson Birthdate: 08/14/1956 Sex: male Admission Date (Current Location): 01/31/2017  [facility] [place]   Facility and Address:  [facility], [address], [place], [place] [address]      Provider Number: [id number]  Attending Physician Name and Address:  Maretta BeesShanker M Ghimire, MD  Relative Name and Phone Number:       Current Level of Care: Hospital Recommended Level of Care: Skilled Nursing Facility Prior Approval Number:    Date Approved/Denied:   PASRR Number: [id number]  Discharge Plan: SNF    Current Diagnoses: Patient Active Problem List   Diagnosis Date Noted  . CAP (community acquired pneumonia) 01/31/2017  . Dementia due to Alzheimer's disease 01/31/2017  . Chest pain 01/31/2017  . Unsteady gait 12/04/2016  . Bilateral leg edema 12/04/2016  . Raynaud's phenomenon 08/31/2014  . DOE (dyspnea on exertion) 07/30/2014  . Bradycardia 07/30/2014  . Macrocytosis without anemia 01/03/2014  . Syncope 07/14/2013  . Peripheral neuropathy (HCC) 07/14/2013  . Memory loss 09/29/2012  . Abnormal ECG 10/16/2011  . Hyperglycemia 07/01/2009  . Hypothyroidism 06/16/2007  . Hyperlipidemia 06/16/2007  . DOWN SYNDROME 06/16/2007    Orientation RESPIRATION BLADDER Height & Weight      (Developmentaly delayed but appropriate)  O2 (2L) Continent Weight: 142 lb 6.7 oz (64.6 kg) Height:  5' (152.4 cm)  BEHAVIORAL SYMPTOMS/MOOD NEUROLOGICAL BOWEL NUTRITION STATUS   (No behaviors)   Continent Diet (Soft diet; thin fluids)  AMBULATORY STATUS COMMUNICATION OF NEEDS Skin   Extensive Assist Verbally Normal                       Personal Care Assistance Level of Assistance  Bathing, Feeding, Dressing Bathing Assistance: Maximum assistance Feeding assistance: Limited assistance Dressing Assistance: Maximum assistance      Functional Limitations Info  Sight, Hearing, Speech Sight Info: Adequate (Down's syndrome) Hearing Info: Adequate (Down's syndrome) Speech Info: Impaired (Down's syndrome)    SPECIAL CARE FACTORS FREQUENCY  PT (By licensed PT), OT (By licensed OT)     PT Frequency: 5x OT Frequency: 5x            Contractures Contractures Info: Not present    Additional Factors Info  Code Status, Allergies, Psychotropic Code Status Info: Full Allergies Info: No Known Allergies Psychotropic Info: See med list   Isolation Precautions Info: Droplet     Current Medications (02/11/2017):  This is the current hospital active medication list Current Facility-Administered Medications  Medication Dose Route Frequency Provider Last Rate Last Dose  . acetaminophen (TYLENOL) tablet 650 mg  650 mg Oral Q6H PRN Isaiah BlakesMarina S Kyazimova, PA-C       Or  . acetaminophen (TYLENOL) suppository 650 mg  650 mg Rectal Q6H PRN Isaiah BlakesMarina S Kyazimova, PA-C      . aspirin EC tablet 81 mg  81 mg Oral Daily Isaiah BlakesMarina S Kyazimova, PA-C   81 mg at 02/11/17 0854  . bisacodyl (DULCOLAX) suppository 10 mg  10 mg Rectal Daily PRN Maretta BeesShanker M Ghimire, MD      . chlorhexidine (PERIDEX) 0.12 % solution 15 mL  15 mL Mouth/Throat BID Isaiah BlakesMarina S Kyazimova, PA-C   15 mL at 02/10/17 2350  . chlorhexidine (PERIDEX) 0.12 % solution 15 mL  15 mL Mouth Rinse BID Clydia LlanoMutaz Elmahi, MD   15 mL at 02/11/17 0855  . divalproex (  DEPAKOTE ER) 24 hr tablet 750 mg  750 mg Oral Daily Isaiah Blakes, PA-C   750 mg at 02/11/17 0854  . donepezil (ARICEPT) tablet 10 mg  10 mg Oral QHS Isaiah Blakes, PA-C   10 mg at 02/10/17 2348  . furosemide (LASIX) tablet 40 mg  40 mg Oral Daily Maretta Bees, MD   40 mg at 02/11/17 0854  . guaiFENesin (MUCINEX) 12 hr tablet 600 mg  600 mg Oral BID Clydia Llano, MD   600 mg at 02/11/17 0854  . guaiFENesin-dextromethorphan (ROBITUSSIN DM) 100-10 MG/5ML syrup 5 mL  5 mL Oral PRN Isaiah Blakes, PA-C      . heparin  injection 5,000 Units  5,000 Units Subcutaneous [address] [place], PA-C   5,000 Units at 02/11/17 0540  . ipratropium-albuterol (DUONEB) 0.5-2.5 (3) MG/3ML nebulizer solution 3 mL  3 mL Nebulization Q4H PRN Clydia Llano, MD      . latanoprost (XALATAN) 0.005 % ophthalmic solution 1 drop  1 drop Both Eyes QHS Isaiah Blakes, PA-C   1 drop at 02/10/17 2349  . levothyroxine (SYNTHROID, LEVOTHROID) tablet 75 mcg  75 mcg Oral QAC breakfast Clydia Llano, MD   75 mcg at 02/11/17 0854  . MEDLINE mouth rinse  15 mL Mouth Rinse q12n4p Clydia Llano, MD   15 mL at 02/10/17 1200  . memantine (NAMENDA) tablet 10 mg  10 mg Oral BID Isaiah Blakes, PA-C   10 mg at 02/11/17 0855  . multivitamin with minerals tablet 1 tablet  1 tablet Oral Daily Ancil Linsey [place], PA-C   1 tablet at 02/11/17 0854  . ondansetron (ZOFRAN) tablet 4 mg  4 mg Oral Q6H PRN Isaiah Blakes, PA-C       Or  . ondansetron [facility]) injection 4 mg  4 mg Intravenous Q6H PRN Isaiah Blakes, PA-C      . polyethylene glycol (MIRALAX / GLYCOLAX) packet 17 g  17 g Oral Daily PRN Isaiah Blakes, PA-C   17 g at 02/09/17 1802  . QUEtiapine (SEROQUEL) tablet 50 mg  50 mg Oral QHS Isaiah Blakes, PA-C   50 mg at 02/10/17 2349  . senna-docusate (Senokot-S) tablet 2 tablet  2 tablet Oral QHS PRN Maretta Bees, MD      . sertraline (ZOLOFT) tablet 50 mg  50 mg Oral Daily Isaiah Blakes, PA-C   50 mg at 02/11/17 0853  . simvastatin (ZOCOR) tablet 20 mg  20 mg Oral q1800 Isaiah Blakes, PA-C   20 mg at 02/10/17 1629     Discharge Medications: Please see discharge summary for a list of discharge medications.  Relevant Imaging Results:  Relevant Lab Results:   Additional Information SSN: [id number]  Dominic Pea, LCSW

## 2017-02-11 NOTE — Progress Notes (Addendum)
Physical Therapy Treatment Patient Details Name: Travis Sampson MRN: [id number] DOB: Jan 02, 1956 Today's Date: 02/11/2017    History of Present Illness Pt is a 61 y.o. male with medical history significant of Down's syndrome, Alzheimer dementia, who was transferred to the ED after he was found in the facility agitated, holding his chest and complaining of chest pain. Pt found to have pneumonia with respiratory failure. Pt with incr O2 requirements and transferred to step down.    PT Comments    Pt slowly progressing towards goals. Pt continues to required total assist for bed mobility and mod-max A for sitting balance. Unable to attempt standing this session secondary to heavy posterior lean and amount of assist required for sitting. Pt inconsistently followed verbal commands with tactile cuing, and when able to respond, required increased time. Continue to feel d/c recommendations are appropriate. Will continue to follow.    Follow Up Recommendations  SNF     Equipment Recommendations  Other (comment) (TBD by next venue )    Recommendations for Other Services       Precautions / Restrictions Precautions Precautions: Fall Restrictions Weight Bearing Restrictions: No    Mobility  Bed Mobility Overal bed mobility: Needs Assistance Bed Mobility: Sit to Supine;Supine to Sit     Supine to sit: Total assist;+2 for physical assistance Sit to supine: +2 for physical assistance;Total assist   General bed mobility comments: Assist with trunk and LEs  Transfers                 General transfer comment: not attempted secondary to heavy posterior lean.   Ambulation/Gait                 Stairs            Wheelchair Mobility    Modified Rankin (Stroke Patients Only)       Balance Overall balance assessment: Needs assistance Sitting-balance support: Feet supported;Bilateral upper extremity supported Sitting balance-Leahy Scale: Poor Sitting balance -  Comments: Pt sat EOB for session requiring mod-max A. Pt demonstrating no posterior postural righting reactions in sitting.  Postural control: Posterior lean;Right lateral lean                          Cognition Arousal/Alertness: Awake/alert Behavior During Therapy: Flat affect Overall Cognitive Status: No family/caregiver present to determine baseline cognitive functioning                 General Comments: Pt followed one step commands inconsistently throughout session with increased time.     Exercises      General Comments General comments (skin integrity, edema, etc.): Pt occasionally following one step commands throughout session. Pt able to visually track upon command, however, did not follow verbal commands for movement of BUE and BLE despite verbal and tactile cuing. Pt unable to correct seated posture upon cuing and did not follow commands to lift head. Required manual assist to correct trunk and head/neck posture throughout session.  Oxygen sats checked throughout session. Noted to be 90% on 2L upon entry in supine. When sitting oxygen sats noted to be 95% on 2L.       Pertinent Vitals/Pain Pain Assessment: Faces Faces Pain Scale: Hurts little more Pain Location: left foot/leg Pain Descriptors / Indicators: Grimacing Pain Intervention(s): Limited activity within patient's tolerance;Monitored during session;Repositioned    Home Living  Prior Function            PT Goals (current goals can now be found in the care plan section) Acute Rehab PT Goals Patient Stated Goal: Unable to state PT Goal Formulation: With patient Time For Goal Achievement: 02/17/17 Potential to Achieve Goals: Fair Progress towards PT goals: Progressing toward goals    Frequency    Min 2X/week      PT Plan Current plan remains appropriate    Co-evaluation             End of Session Equipment Utilized During Treatment: Gait  belt;Oxygen Activity Tolerance: Patient limited by fatigue (Limited by cognition) Patient left: in bed;with call bell/phone within reach;with bed alarm set (elevated HOB ) Nurse Communication: Mobility status PT Visit Diagnosis: Muscle weakness (generalized) (M62.81)     Time: [id number] PT Time Calculation (min) (ACUTE ONLY): 17 min  Charges:  $Therapeutic Activity: 8-22 mins                    G Codes:       Arna SnipeSmith, Yoshiaki Kreuser Leanne 02/11/2017, 1:49 PM  Margot ChimesBrittany Smith, PT, DPT  Acute Rehabilitation Services  Pager: [phone]

## 2017-02-11 NOTE — Clinical Social Work Placement (Signed)
   CLINICAL SOCIAL WORK PLACEMENT  NOTE  Date:  02/11/2017  Patient Details  Name: Travis Sampson MRN: [id number] Date of Birth: 10/11/1956  Clinical Social Work is seeking post-discharge placement for this patient at the Skilled  Nursing Facility level of care (*CSW will initial, date and re-position this form in  chart as items are completed):  Yes   Patient/family provided with [place] Clinical Social Work Department's list of facilities offering this level of care within the geographic area requested by the patient (or if unable, by the patient's family).  Yes   Patient/family informed of their freedom to choose among providers that offer the needed level of care, that participate in Medicare, Medicaid or managed care program needed by the patient, have an available bed and are willing to accept the patient.  Yes   Patient/family informed of [facility]'s ownership interest in [facility] and [facility], as well as of the fact that they are under no obligation to receive care at these facilities.  PASRR submitted to EDS on 02/05/17     PASRR number received on 02/11/17     Existing PASRR number confirmed on       FL2 transmitted to all facilities in geographic area requested by pt/family on 02/05/17     FL2 transmitted to all facilities within larger geographic area on       Patient informed that his/her managed care company has contracts with or will negotiate with certain facilities, including the following:        Yes   Patient/family informed of bed offers received.  [facility]     Physician recommends and patient chooses bed at      [facility].  Patient to be transferred to facility by [facility]     Patient family notified on 02/11/17 of transfer.  Name of family member notified:  Ursula AlertRobin Stiles     PHYSICIAN Please sign FL2     Additional Comment:  Pt is ready for d/c today. PASRR # rec'd  [id number] [name] notified and in agreement with plan. [facility] to [facility]. CSW [facility] with [name] at [facility] to confirm bed and received room and report. Per [name], CSW informed Zella BallRobin to come to facility asap to complete paperwork, due to inclement weather. CSW provided room and report to RN and put in treatment team sticky note. CSW arranged transportation with [facility] for 11:30am pick-up. CSW signing off as no further social work needs identified.   _______________________________________________ Travis PeaJeneya G Chasitee Zenker, LCSW 02/11/2017, 10:45 AM

## 2017-02-11 NOTE — Care Management Important Message (Signed)
Important Message  Patient Details  Name: Travis Sampson MRN: [id number] Date of Birth: 07/13/1956   Medicare Important Message Given:       [name] 02/11/2017, 12:33 PM

## 2017-02-12 DIAGNOSIS — G609 Hereditary and idiopathic neuropathy, unspecified: Secondary | ICD-10-CM | POA: Diagnosis not present

## 2017-02-12 DIAGNOSIS — Q909 Down syndrome, unspecified: Secondary | ICD-10-CM | POA: Diagnosis not present

## 2017-02-12 DIAGNOSIS — J189 Pneumonia, unspecified organism: Secondary | ICD-10-CM | POA: Diagnosis not present

## 2017-02-12 DIAGNOSIS — E039 Hypothyroidism, unspecified: Secondary | ICD-10-CM | POA: Diagnosis not present

## 2017-02-22 DIAGNOSIS — F0150 Vascular dementia without behavioral disturbance: Secondary | ICD-10-CM | POA: Diagnosis not present

## 2017-02-25 DIAGNOSIS — Z66 Do not resuscitate: Secondary | ICD-10-CM | POA: Diagnosis not present

## 2017-03-12 DIAGNOSIS — R918 Other nonspecific abnormal finding of lung field: Secondary | ICD-10-CM | POA: Diagnosis not present

## 2017-03-13 DIAGNOSIS — G301 Alzheimer's disease with late onset: Secondary | ICD-10-CM | POA: Diagnosis not present

## 2017-05-05 DIAGNOSIS — S52001A Unspecified fracture of upper end of right ulna, initial encounter for closed fracture: Secondary | ICD-10-CM | POA: Diagnosis not present

## 2017-05-05 DIAGNOSIS — M19011 Primary osteoarthritis, right shoulder: Secondary | ICD-10-CM | POA: Diagnosis not present

## 2017-05-05 DIAGNOSIS — M79621 Pain in right upper arm: Secondary | ICD-10-CM | POA: Diagnosis not present

## 2017-05-09 DIAGNOSIS — M25551 Pain in right hip: Secondary | ICD-10-CM | POA: Diagnosis not present

## 2017-05-15 DIAGNOSIS — E039 Hypothyroidism, unspecified: Secondary | ICD-10-CM | POA: Diagnosis not present

## 2017-05-15 DIAGNOSIS — R7989 Other specified abnormal findings of blood chemistry: Secondary | ICD-10-CM | POA: Diagnosis not present

## 2017-06-27 DIAGNOSIS — Z79899 Other long term (current) drug therapy: Secondary | ICD-10-CM | POA: Diagnosis not present

## 2017-06-27 DIAGNOSIS — R569 Unspecified convulsions: Secondary | ICD-10-CM | POA: Diagnosis not present

## 2017-06-27 DIAGNOSIS — E785 Hyperlipidemia, unspecified: Secondary | ICD-10-CM | POA: Diagnosis not present

## 2017-07-18 DIAGNOSIS — R569 Unspecified convulsions: Secondary | ICD-10-CM | POA: Diagnosis not present

## 2017-08-14 DIAGNOSIS — R0902 Hypoxemia: Secondary | ICD-10-CM | POA: Diagnosis not present

## 2017-08-14 DIAGNOSIS — H01009 Unspecified blepharitis unspecified eye, unspecified eyelid: Secondary | ICD-10-CM | POA: Diagnosis not present

## 2017-08-14 DIAGNOSIS — G4089 Other seizures: Secondary | ICD-10-CM | POA: Diagnosis not present

## 2017-08-14 DIAGNOSIS — E785 Hyperlipidemia, unspecified: Secondary | ICD-10-CM | POA: Diagnosis not present

## 2017-08-14 DIAGNOSIS — M6281 Muscle weakness (generalized): Secondary | ICD-10-CM | POA: Diagnosis not present

## 2017-08-14 DIAGNOSIS — K219 Gastro-esophageal reflux disease without esophagitis: Secondary | ICD-10-CM | POA: Diagnosis not present

## 2017-08-14 DIAGNOSIS — H40219 Acute angle-closure glaucoma, unspecified eye: Secondary | ICD-10-CM | POA: Diagnosis not present

## 2017-08-14 DIAGNOSIS — E569 Vitamin deficiency, unspecified: Secondary | ICD-10-CM | POA: Diagnosis not present

## 2017-08-14 DIAGNOSIS — L853 Xerosis cutis: Secondary | ICD-10-CM | POA: Diagnosis not present

## 2017-08-14 DIAGNOSIS — G309 Alzheimer's disease, unspecified: Secondary | ICD-10-CM | POA: Diagnosis not present

## 2017-08-14 DIAGNOSIS — E039 Hypothyroidism, unspecified: Secondary | ICD-10-CM | POA: Diagnosis not present

## 2017-08-14 DIAGNOSIS — K5900 Constipation, unspecified: Secondary | ICD-10-CM | POA: Diagnosis not present

## 2017-09-16 DIAGNOSIS — I10 Essential (primary) hypertension: Secondary | ICD-10-CM | POA: Diagnosis not present

## 2017-09-16 DIAGNOSIS — R0902 Hypoxemia: Secondary | ICD-10-CM | POA: Diagnosis not present

## 2017-09-16 DIAGNOSIS — M6281 Muscle weakness (generalized): Secondary | ICD-10-CM | POA: Diagnosis not present

## 2017-09-16 DIAGNOSIS — K5900 Constipation, unspecified: Secondary | ICD-10-CM | POA: Diagnosis not present

## 2017-09-16 DIAGNOSIS — E039 Hypothyroidism, unspecified: Secondary | ICD-10-CM | POA: Diagnosis not present

## 2017-09-16 DIAGNOSIS — G309 Alzheimer's disease, unspecified: Secondary | ICD-10-CM | POA: Diagnosis not present

## 2017-09-20 DIAGNOSIS — G40119 Localization-related (focal) (partial) symptomatic epilepsy and epileptic syndromes with simple partial seizures, intractable, without status epilepticus: Secondary | ICD-10-CM | POA: Diagnosis not present

## 2017-10-02 DIAGNOSIS — G4089 Other seizures: Secondary | ICD-10-CM | POA: Diagnosis not present

## 2017-10-02 DIAGNOSIS — R569 Unspecified convulsions: Secondary | ICD-10-CM | POA: Diagnosis not present

## 2017-10-04 DIAGNOSIS — R569 Unspecified convulsions: Secondary | ICD-10-CM | POA: Diagnosis not present

## 2017-10-04 DIAGNOSIS — R7989 Other specified abnormal findings of blood chemistry: Secondary | ICD-10-CM | POA: Diagnosis not present

## 2017-10-04 DIAGNOSIS — E708 Other disorders of aromatic amino-acid metabolism: Secondary | ICD-10-CM | POA: Diagnosis not present

## 2017-10-04 DIAGNOSIS — G4089 Other seizures: Secondary | ICD-10-CM | POA: Diagnosis not present

## 2017-10-08 DIAGNOSIS — F0280 Dementia in other diseases classified elsewhere without behavioral disturbance: Secondary | ICD-10-CM | POA: Diagnosis not present

## 2017-10-08 DIAGNOSIS — G300 Alzheimer's disease with early onset: Secondary | ICD-10-CM | POA: Diagnosis not present

## 2017-10-14 DIAGNOSIS — F064 Anxiety disorder due to known physiological condition: Secondary | ICD-10-CM | POA: Diagnosis not present

## 2017-10-14 DIAGNOSIS — E039 Hypothyroidism, unspecified: Secondary | ICD-10-CM | POA: Diagnosis not present

## 2017-10-14 DIAGNOSIS — L853 Xerosis cutis: Secondary | ICD-10-CM | POA: Diagnosis not present

## 2017-10-14 DIAGNOSIS — H40219 Acute angle-closure glaucoma, unspecified eye: Secondary | ICD-10-CM | POA: Diagnosis not present

## 2017-10-14 DIAGNOSIS — I10 Essential (primary) hypertension: Secondary | ICD-10-CM | POA: Diagnosis not present

## 2017-10-14 DIAGNOSIS — G309 Alzheimer's disease, unspecified: Secondary | ICD-10-CM | POA: Diagnosis not present

## 2017-10-14 DIAGNOSIS — R0902 Hypoxemia: Secondary | ICD-10-CM | POA: Diagnosis not present

## 2017-10-14 DIAGNOSIS — M6281 Muscle weakness (generalized): Secondary | ICD-10-CM | POA: Diagnosis not present

## 2017-10-14 DIAGNOSIS — K5900 Constipation, unspecified: Secondary | ICD-10-CM | POA: Diagnosis not present

## 2017-10-14 DIAGNOSIS — K219 Gastro-esophageal reflux disease without esophagitis: Secondary | ICD-10-CM | POA: Diagnosis not present

## 2017-10-14 DIAGNOSIS — H01009 Unspecified blepharitis unspecified eye, unspecified eyelid: Secondary | ICD-10-CM | POA: Diagnosis not present

## 2017-10-14 DIAGNOSIS — Q909 Down syndrome, unspecified: Secondary | ICD-10-CM | POA: Diagnosis not present

## 2017-10-15 IMAGING — CR DG CHEST 2V
2 series · 2 of 2 positions shown · non-contrast
Comparison: Chest x-ray of July 04, 2013 and chest CT scan of
July 05, 2013

CLINICAL DATA: Generalized chest pain, shortness of breath. The
patient is mentally challenged.

EXAM:
CHEST  2 VIEW

[chest lat]
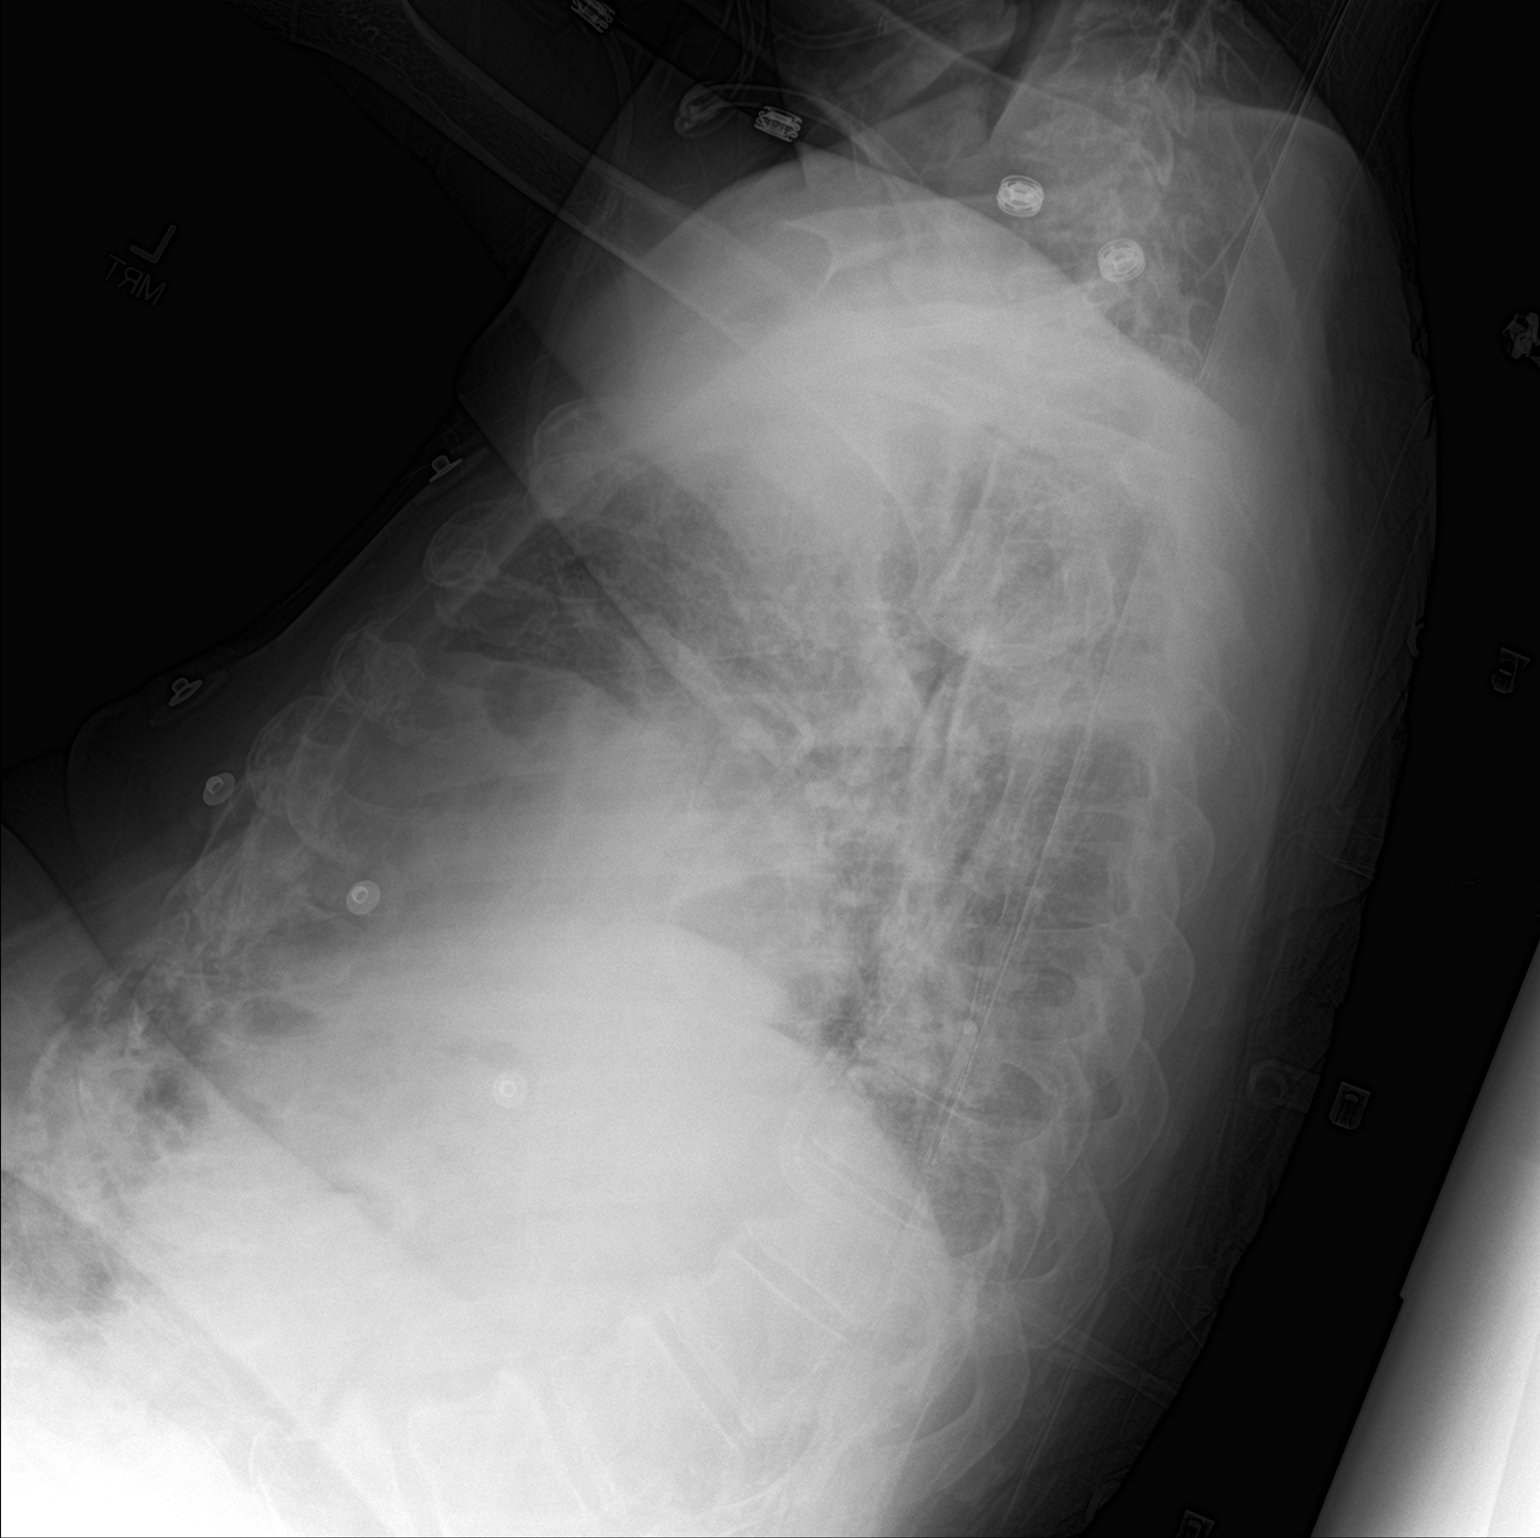

[chest ap]
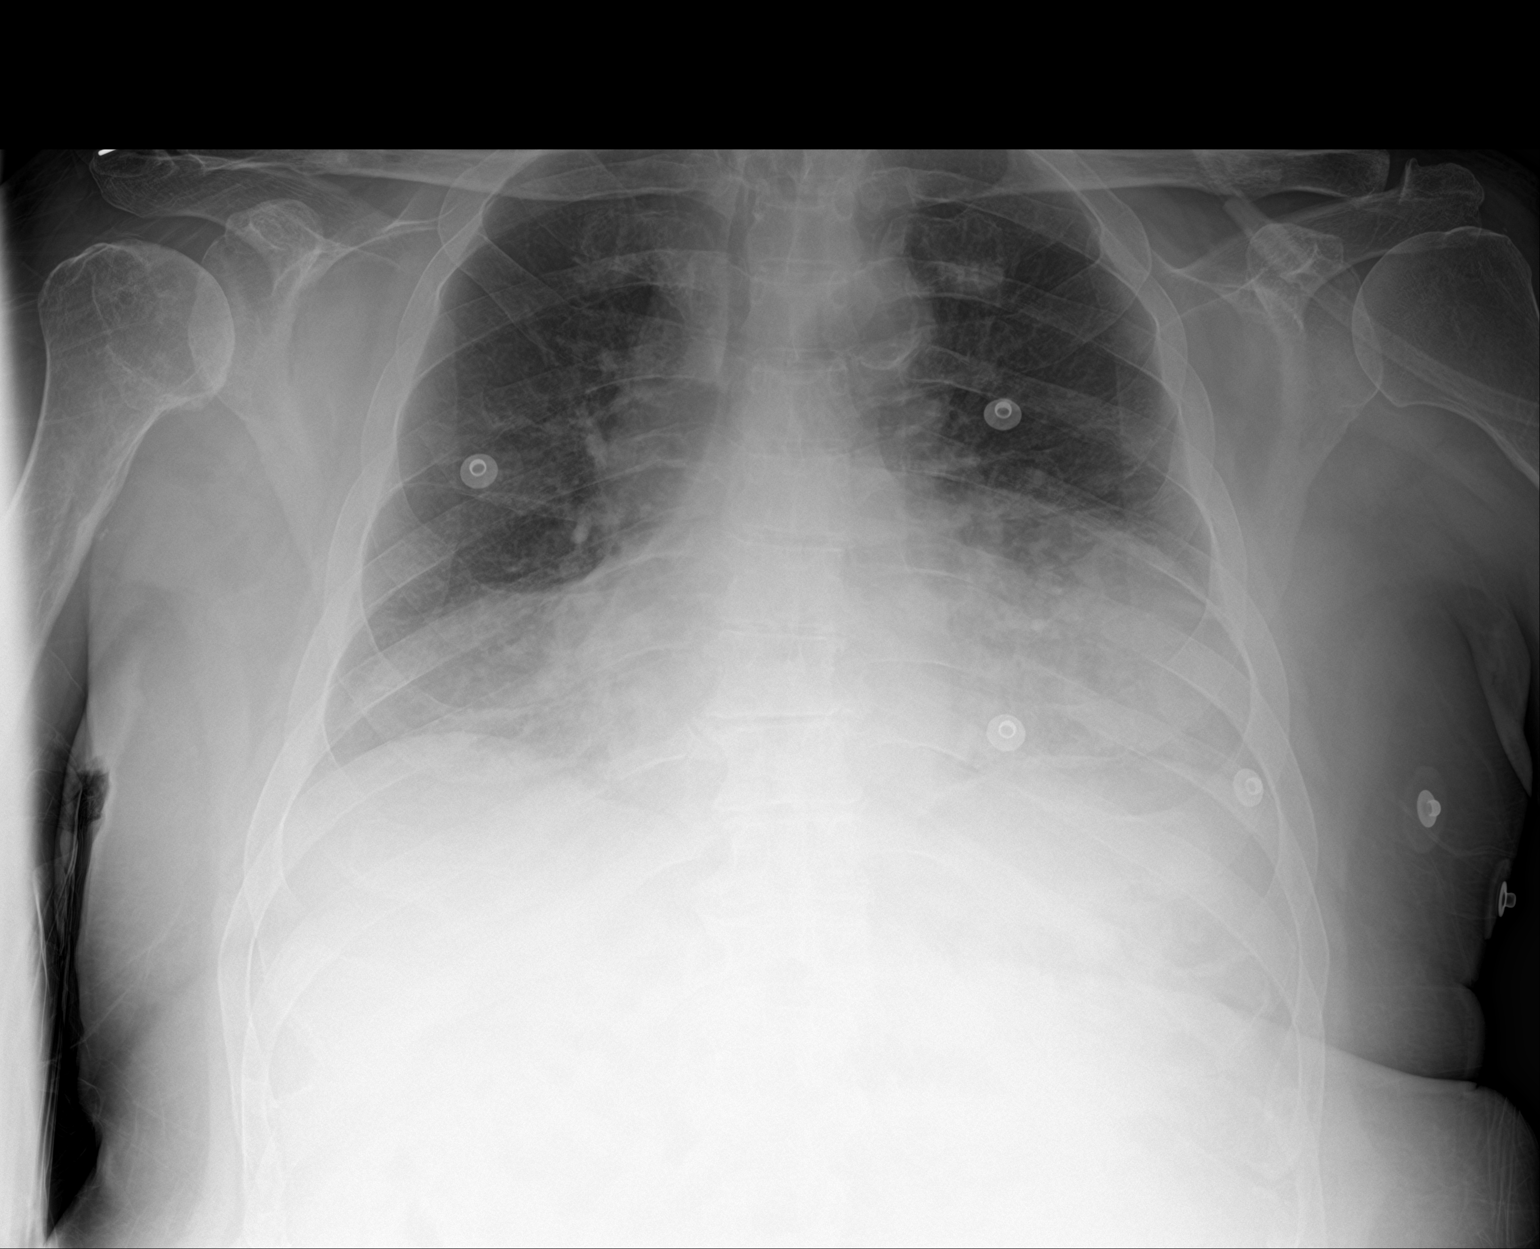

[2 of 2 positions shown; findings below may reference images not displayed]

FINDINGS: The lungs are adequately inflated. New abnormal interstitial and
alveolar opacity is present at the lung bases greatest on the left.
No definite pleural effusion is observed. The heart borders are
obscured. The pulmonary vascularity is not clearly engorged. There
is calcification in the wall of the aortic arch. There is multilevel
degenerative disc disease of the visualized portions of the thoracic
spine.
IMPRESSION: Findings worrisome for lingular and right middle and anterior lower
lobe pneumonia. No definite pulmonary edema or pleural effusion.
Followup PA and lateral chest X-ray is recommended in 3-4 weeks
following trial of antibiotic therapy to ensure resolution and
exclude underlying malignancy.

Thoracic aortic atherosclerosis.

## 2017-10-16 DIAGNOSIS — R0902 Hypoxemia: Secondary | ICD-10-CM | POA: Diagnosis not present

## 2017-10-16 DIAGNOSIS — E039 Hypothyroidism, unspecified: Secondary | ICD-10-CM | POA: Diagnosis not present

## 2017-10-16 DIAGNOSIS — M6281 Muscle weakness (generalized): Secondary | ICD-10-CM | POA: Diagnosis not present

## 2017-10-16 DIAGNOSIS — H40219 Acute angle-closure glaucoma, unspecified eye: Secondary | ICD-10-CM | POA: Diagnosis not present

## 2017-10-16 DIAGNOSIS — G309 Alzheimer's disease, unspecified: Secondary | ICD-10-CM | POA: Diagnosis not present

## 2017-10-16 DIAGNOSIS — L853 Xerosis cutis: Secondary | ICD-10-CM | POA: Diagnosis not present

## 2017-10-16 DIAGNOSIS — K219 Gastro-esophageal reflux disease without esophagitis: Secondary | ICD-10-CM | POA: Diagnosis not present

## 2017-10-16 DIAGNOSIS — G4089 Other seizures: Secondary | ICD-10-CM | POA: Diagnosis not present

## 2017-10-16 DIAGNOSIS — Q909 Down syndrome, unspecified: Secondary | ICD-10-CM | POA: Diagnosis not present

## 2017-10-16 DIAGNOSIS — I10 Essential (primary) hypertension: Secondary | ICD-10-CM | POA: Diagnosis not present

## 2017-10-16 DIAGNOSIS — H01009 Unspecified blepharitis unspecified eye, unspecified eyelid: Secondary | ICD-10-CM | POA: Diagnosis not present

## 2017-10-16 DIAGNOSIS — K5900 Constipation, unspecified: Secondary | ICD-10-CM | POA: Diagnosis not present

## 2017-10-18 DIAGNOSIS — Z87310 Personal history of (healed) osteoporosis fracture: Secondary | ICD-10-CM | POA: Diagnosis not present

## 2017-10-18 DIAGNOSIS — G894 Chronic pain syndrome: Secondary | ICD-10-CM | POA: Diagnosis not present

## 2017-10-19 IMAGING — DX DG CHEST 1V PORT
1 series · 1 of 1 positions shown · non-contrast
Comparison: 01/31/2017 chest radiograph.

CLINICAL DATA: Hypoxia

EXAM:
PORTABLE CHEST 1 VIEW

[chest ap]
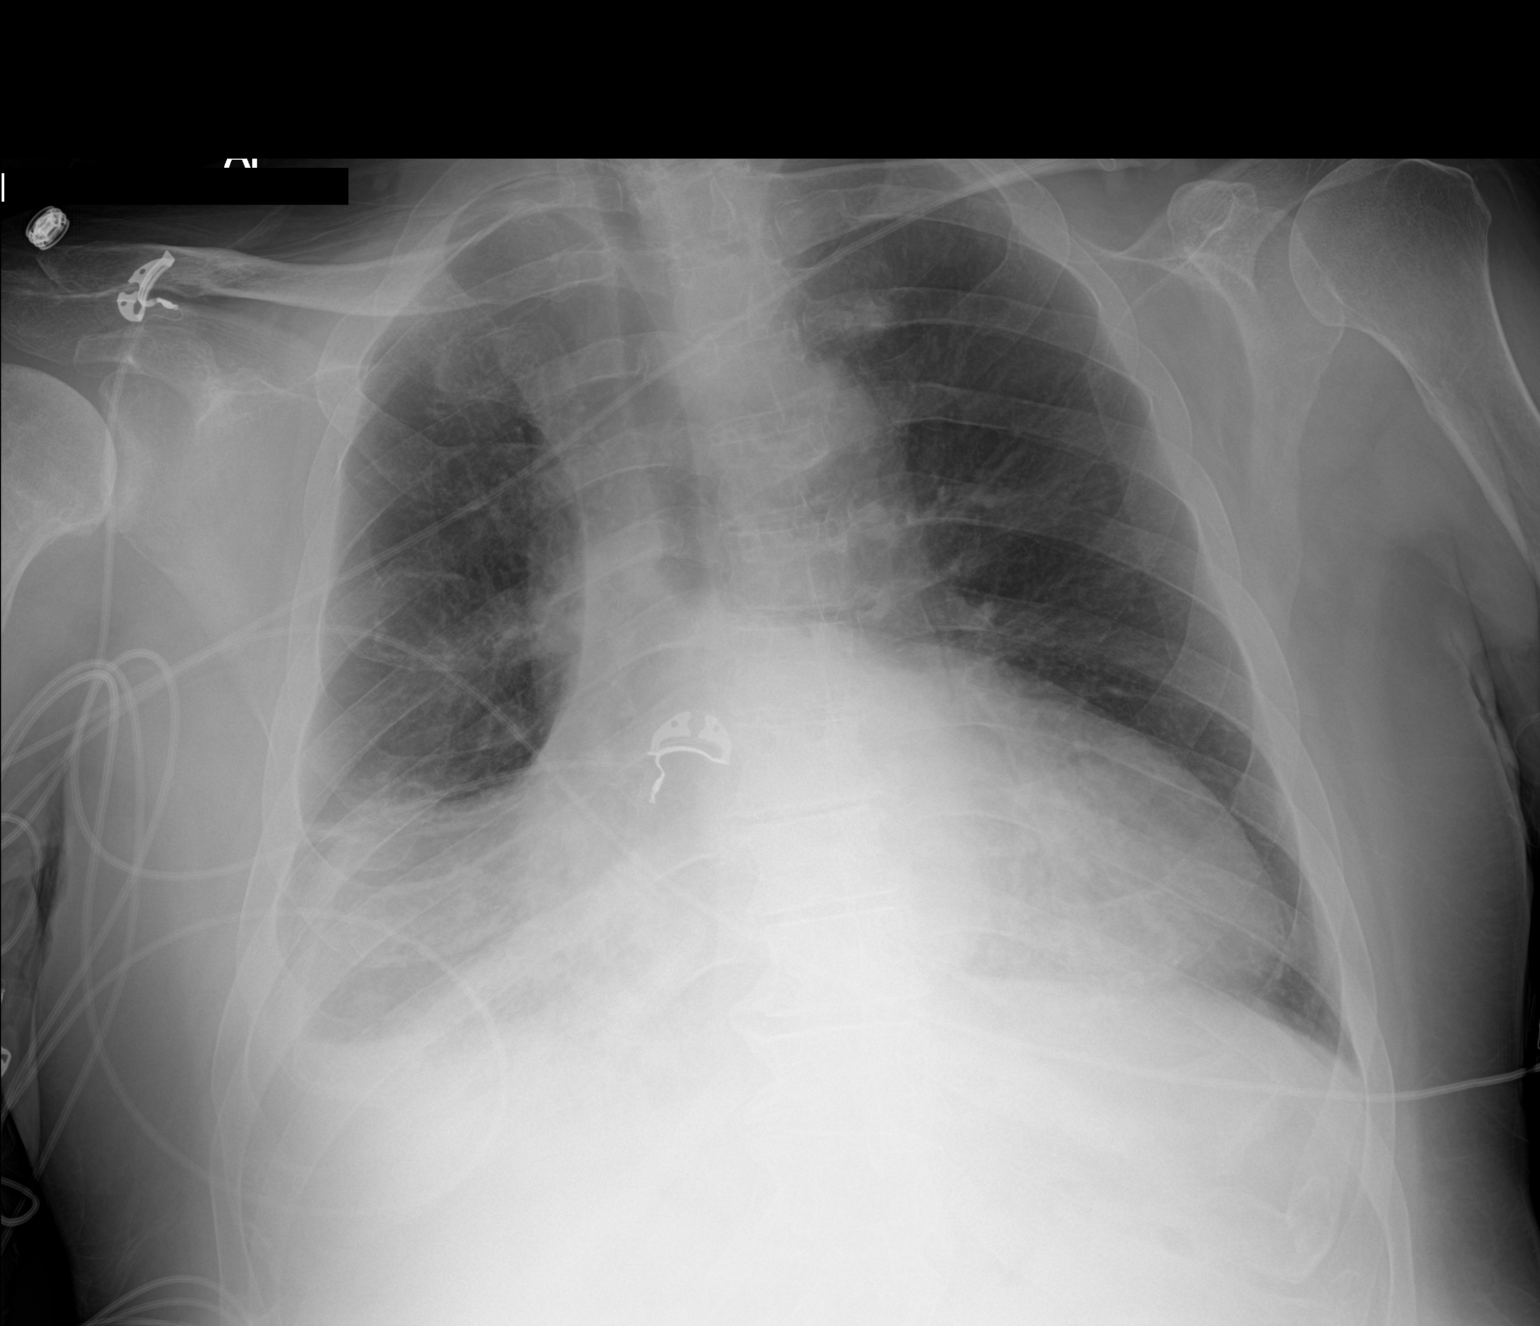

[1 of 1 positions shown; findings below may reference images not displayed]

FINDINGS: Right rotated chest radiograph. Stable cardiomediastinal silhouette
with mild cardiomegaly and aortic atherosclerosis. No pneumothorax.
Small right pleural effusion appears new. No left pleural effusion.
No pulmonary edema. Patchy opacities at the right greater than left
lung bases, not appreciably changed.
IMPRESSION: 1. No appreciable change in patchy bibasilar lung opacities, right
greater the left, which could represent atelectasis, pneumonia
and/or aspiration.
2. New small right pleural effusion.
3. Mild cardiomegaly without pulmonary edema.
4. Aortic atherosclerosis.

## 2017-10-20 IMAGING — CR DG CHEST 1V PORT
1 series · 1 of 1 positions shown · non-contrast
Comparison: 02/04/2017, 01/31/2017 and earlier, including CTA chest
07/05/2013.

CLINICAL DATA: 61-year-old with acute hypoxia.

EXAM:
PORTABLE CHEST 1 VIEW

[AP]
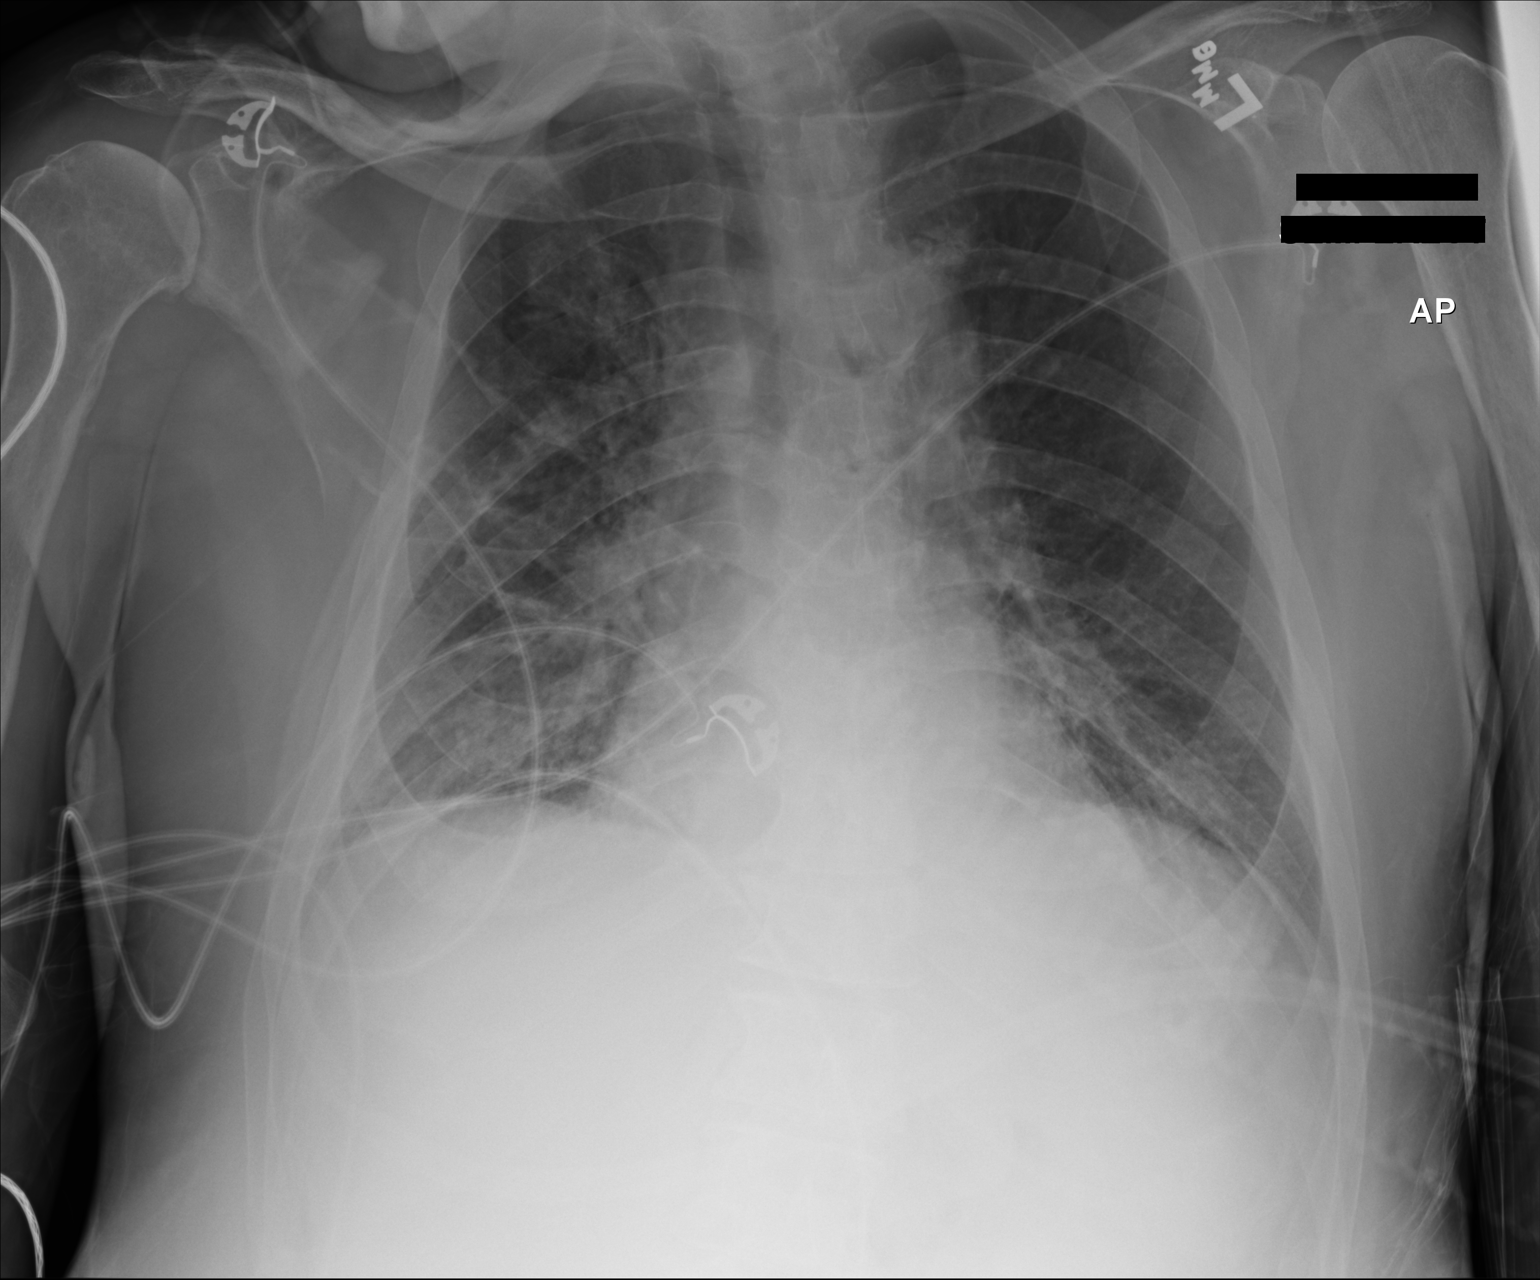

[1 of 1 positions shown; findings below may reference images not displayed]

FINDINGS: Suboptimal inspiration. Cardiac silhouette mildly to moderately
enlarged for technique, unchanged. New patchy opacities in the right
upper lobe and at the left lung base since yesterday. Persistent
patchy airspace opacities at the right lung base. Small right
pleural effusion and possible small left pleural effusion,
unchanged. Pulmonary vascularity normal.
IMPRESSION: 1. Progressive pneumonia since yesterday, with new involvement of
the right upper lobe a into the left lung base and stable
involvement of the right lung base.
2. Stable small right pleural effusion a possible small left pleural
effusion.

## 2017-10-21 IMAGING — CR DG CHEST 1V PORT
1 series · 1 of 1 positions shown · non-contrast
Comparison: 02/05/2017 and earlier.

CLINICAL DATA: 61-year-old male with chest pain, shortness of
breath, hypoxia. Initial encounter.

EXAM:
PORTABLE CHEST 1 VIEW

[AP]
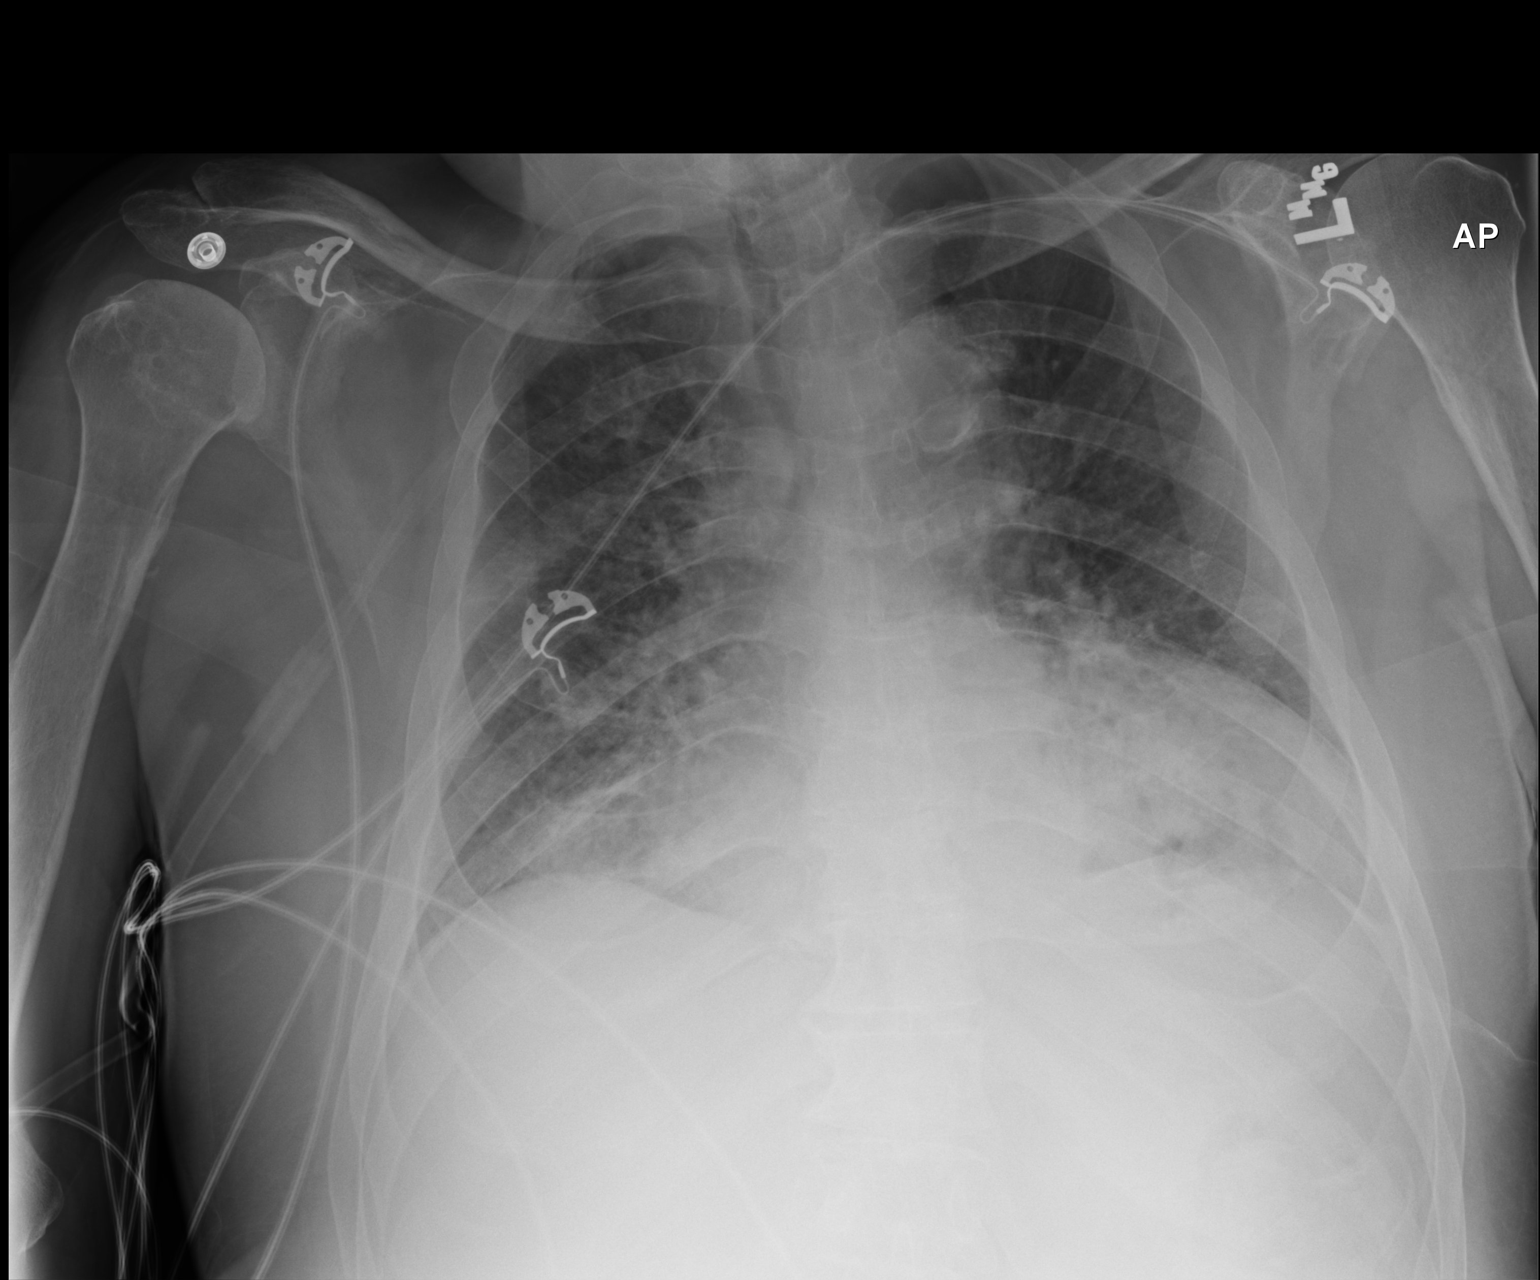

[1 of 1 positions shown; findings below may reference images not displayed]

FINDINGS: Portable AP semi upright view at 4161 hours. Lower lung volumes
compared to yesterday. Patchy and nodular bibasilar pulmonary
opacity persists, progressed on the left since 02/04/2017. There is
mild involvement of the right upper lobe. The left upper lung remain
spared. No pneumothorax or pulmonary edema. Calcified aortic
atherosclerosis. Grossly stable mediastinal contours. No definite
pleural effusion. Negative visible bowel gas pattern.
IMPRESSION: Lower lung volumes compared the yesterday.

Multifocal bilateral pneumonia suspected with interval worsening
ventilation at the left lung base.

## 2017-10-28 DIAGNOSIS — H01009 Unspecified blepharitis unspecified eye, unspecified eyelid: Secondary | ICD-10-CM | POA: Diagnosis not present

## 2017-10-28 DIAGNOSIS — E785 Hyperlipidemia, unspecified: Secondary | ICD-10-CM | POA: Diagnosis not present

## 2017-10-28 DIAGNOSIS — K5900 Constipation, unspecified: Secondary | ICD-10-CM | POA: Diagnosis not present

## 2017-10-28 DIAGNOSIS — F064 Anxiety disorder due to known physiological condition: Secondary | ICD-10-CM | POA: Diagnosis not present

## 2017-10-28 DIAGNOSIS — G4089 Other seizures: Secondary | ICD-10-CM | POA: Diagnosis not present

## 2017-10-28 DIAGNOSIS — G309 Alzheimer's disease, unspecified: Secondary | ICD-10-CM | POA: Diagnosis not present

## 2017-10-28 DIAGNOSIS — E569 Vitamin deficiency, unspecified: Secondary | ICD-10-CM | POA: Diagnosis not present

## 2017-10-28 DIAGNOSIS — L853 Xerosis cutis: Secondary | ICD-10-CM | POA: Diagnosis not present

## 2017-10-28 DIAGNOSIS — E039 Hypothyroidism, unspecified: Secondary | ICD-10-CM | POA: Diagnosis not present

## 2017-10-28 DIAGNOSIS — H40219 Acute angle-closure glaucoma, unspecified eye: Secondary | ICD-10-CM | POA: Diagnosis not present

## 2017-10-28 DIAGNOSIS — M6281 Muscle weakness (generalized): Secondary | ICD-10-CM | POA: Diagnosis not present

## 2017-10-29 DIAGNOSIS — M2010 Hallux valgus (acquired), unspecified foot: Secondary | ICD-10-CM | POA: Diagnosis not present

## 2017-10-29 DIAGNOSIS — I739 Peripheral vascular disease, unspecified: Secondary | ICD-10-CM | POA: Diagnosis not present

## 2017-10-29 DIAGNOSIS — Q845 Enlarged and hypertrophic nails: Secondary | ICD-10-CM | POA: Diagnosis not present

## 2017-10-29 DIAGNOSIS — L853 Xerosis cutis: Secondary | ICD-10-CM | POA: Diagnosis not present

## 2017-10-29 DIAGNOSIS — B351 Tinea unguium: Secondary | ICD-10-CM | POA: Diagnosis not present

## 2017-10-29 DIAGNOSIS — L603 Nail dystrophy: Secondary | ICD-10-CM | POA: Diagnosis not present

## 2017-11-05 DIAGNOSIS — R0902 Hypoxemia: Secondary | ICD-10-CM | POA: Diagnosis not present

## 2017-11-06 ENCOUNTER — Emergency Department (HOSPITAL_COMMUNITY): Payer: Medicare Other

## 2017-11-06 ENCOUNTER — Inpatient Hospital Stay (HOSPITAL_COMMUNITY)
Admission: EM | Admit: 2017-11-06 | Discharge: 2017-12-03 | DRG: 177 | Disposition: E | Payer: Medicare Other | Attending: Internal Medicine | Admitting: Internal Medicine

## 2017-11-06 ENCOUNTER — Encounter (HOSPITAL_COMMUNITY): Payer: Self-pay | Admitting: *Deleted

## 2017-11-06 ENCOUNTER — Other Ambulatory Visit: Payer: Self-pay

## 2017-11-06 DIAGNOSIS — J9601 Acute respiratory failure with hypoxia: Secondary | ICD-10-CM | POA: Diagnosis present

## 2017-11-06 DIAGNOSIS — Q909 Down syndrome, unspecified: Secondary | ICD-10-CM

## 2017-11-06 DIAGNOSIS — Z823 Family history of stroke: Secondary | ICD-10-CM | POA: Diagnosis not present

## 2017-11-06 DIAGNOSIS — R000 Tachycardia, unspecified: Secondary | ICD-10-CM | POA: Diagnosis present

## 2017-11-06 DIAGNOSIS — R569 Unspecified convulsions: Secondary | ICD-10-CM | POA: Diagnosis present

## 2017-11-06 DIAGNOSIS — R600 Localized edema: Secondary | ICD-10-CM | POA: Diagnosis present

## 2017-11-06 DIAGNOSIS — Z515 Encounter for palliative care: Secondary | ICD-10-CM | POA: Diagnosis not present

## 2017-11-06 DIAGNOSIS — J690 Pneumonitis due to inhalation of food and vomit: Principal | ICD-10-CM | POA: Diagnosis present

## 2017-11-06 DIAGNOSIS — J9602 Acute respiratory failure with hypercapnia: Secondary | ICD-10-CM | POA: Diagnosis present

## 2017-11-06 DIAGNOSIS — J189 Pneumonia, unspecified organism: Secondary | ICD-10-CM

## 2017-11-06 DIAGNOSIS — Z7982 Long term (current) use of aspirin: Secondary | ICD-10-CM

## 2017-11-06 DIAGNOSIS — Z66 Do not resuscitate: Secondary | ICD-10-CM | POA: Diagnosis present

## 2017-11-06 DIAGNOSIS — G309 Alzheimer's disease, unspecified: Secondary | ICD-10-CM | POA: Diagnosis present

## 2017-11-06 DIAGNOSIS — R2681 Unsteadiness on feet: Secondary | ICD-10-CM | POA: Diagnosis present

## 2017-11-06 DIAGNOSIS — I959 Hypotension, unspecified: Secondary | ICD-10-CM | POA: Diagnosis present

## 2017-11-06 DIAGNOSIS — I7300 Raynaud's syndrome without gangrene: Secondary | ICD-10-CM | POA: Diagnosis present

## 2017-11-06 DIAGNOSIS — F0280 Dementia in other diseases classified elsewhere without behavioral disturbance: Secondary | ICD-10-CM | POA: Diagnosis present

## 2017-11-06 DIAGNOSIS — E785 Hyperlipidemia, unspecified: Secondary | ICD-10-CM | POA: Diagnosis present

## 2017-11-06 DIAGNOSIS — G4089 Other seizures: Secondary | ICD-10-CM | POA: Diagnosis not present

## 2017-11-06 DIAGNOSIS — E039 Hypothyroidism, unspecified: Secondary | ICD-10-CM | POA: Diagnosis present

## 2017-11-06 DIAGNOSIS — Z801 Family history of malignant neoplasm of trachea, bronchus and lung: Secondary | ICD-10-CM

## 2017-11-06 DIAGNOSIS — R0902 Hypoxemia: Secondary | ICD-10-CM | POA: Diagnosis not present

## 2017-11-06 DIAGNOSIS — R0600 Dyspnea, unspecified: Secondary | ICD-10-CM | POA: Diagnosis not present

## 2017-11-06 LAB — BASIC METABOLIC PANEL
ANION GAP: 7 (ref 5–15)
BUN: 16 mg/dL (ref 6–20)
CO2: 28 mmol/L (ref 22–32)
Calcium: 8.4 mg/dL — ABNORMAL LOW (ref 8.9–10.3)
Chloride: 103 mmol/L (ref 101–111)
Creatinine, Ser: 0.78 mg/dL (ref 0.61–1.24)
GFR calc Af Amer: >60 mL/min (ref >60)
GLUCOSE: 123 mg/dL — AB (ref 65–99)
POTASSIUM: 4.2 mmol/L (ref 3.5–5.1)
SODIUM: 138 mmol/L (ref 135–145)

## 2017-11-06 LAB — CBC
HCT: 40.2 % (ref 39.0–52.0)
Hemoglobin: 13.8 g/dL (ref 13.0–17.0)
MCH: 36.8 pg — ABNORMAL HIGH (ref 26.0–34.0)
MCHC: 34.3 g/dL (ref 30.0–36.0)
MCV: 107.2 fL — AB (ref 78.0–100.0)
PLATELETS: 176 10*3/uL (ref 150–400)
RBC: 3.75 MIL/uL — AB (ref 4.22–5.81)
RDW: 15.4 % (ref 11.5–15.5)
WBC: 9.8 10*3/uL (ref 4.0–10.5)

## 2017-11-06 LAB — VALPROIC ACID LEVEL: VALPROIC ACID LVL: 60 ug/mL (ref 50.0–100.0)

## 2017-11-06 LAB — INFLUENZA PANEL BY PCR (TYPE A & B)
INFLBPCR: NEGATIVE
Influenza A By PCR: NEGATIVE

## 2017-11-06 MED ORDER — LORAZEPAM 2 MG/ML PO CONC
1.0000 mg | ORAL | Status: DC | PRN
  Filled 2017-11-06: qty 0.5

## 2017-11-06 MED ORDER — LORAZEPAM 1 MG PO TABS: 1.0000 mg | ORAL_TABLET | ORAL | Status: DC | PRN

## 2017-11-06 MED ORDER — ACETAMINOPHEN 650 MG RE SUPP: 650.0000 mg | Freq: Four times a day (QID) | RECTAL | Status: DC | PRN

## 2017-11-06 MED ORDER — POLYVINYL ALCOHOL 1.4 % OP SOLN
1.0000 [drp] | Freq: Four times a day (QID) | OPHTHALMIC | Status: DC | PRN
  Filled 2017-11-06: qty 15

## 2017-11-06 MED ORDER — LORAZEPAM 2 MG/ML IJ SOLN
1.0000 mg | Freq: Once | INTRAMUSCULAR | Status: AC
  Administered 2017-11-06: 1 mg via INTRAVENOUS
  Filled 2017-11-06: qty 1

## 2017-11-06 MED ORDER — BIOTENE DRY MOUTH MT LIQD: 15.0000 mL | OROMUCOSAL | Status: DC | PRN

## 2017-11-06 MED ORDER — MORPHINE SULFATE (CONCENTRATE) 10 MG/0.5ML PO SOLN: 5.0000 mg | ORAL | Status: DC | PRN

## 2017-11-06 MED ORDER — SODIUM CHLORIDE 0.9 % IV SOLN
12.5000 mg | Freq: Four times a day (QID) | INTRAVENOUS | Status: DC | PRN
  Filled 2017-11-06: qty 0.5

## 2017-11-06 MED ORDER — MORPHINE SULFATE (CONCENTRATE) 20 MG/ML PO SOLN: 5.0000 mg | ORAL | 0 refills | Status: AC | PRN

## 2017-11-06 MED ORDER — LORAZEPAM 2 MG/ML PO CONC: 0.5000 mg | Freq: Three times a day (TID) | ORAL | 0 refills | Status: AC

## 2017-11-06 MED ORDER — ACETAMINOPHEN 325 MG PO TABS: 650.0000 mg | ORAL_TABLET | Freq: Four times a day (QID) | ORAL | Status: DC | PRN

## 2017-11-06 MED ORDER — MORPHINE SULFATE (PF) 2 MG/ML IV SOLN: 1.0000 mg | INTRAVENOUS | Status: DC | PRN

## 2017-11-06 MED ORDER — LORAZEPAM 2 MG/ML IJ SOLN: 1.0000 mg | INTRAMUSCULAR | Status: DC | PRN

## 2017-11-06 MED ORDER — MORPHINE SULFATE (PF) 4 MG/ML IV SOLN
4.0000 mg | Freq: Once | INTRAVENOUS | Status: AC
  Administered 2017-11-06: 4 mg via INTRAVENOUS
  Filled 2017-11-06: qty 1

## 2017-11-06 MED ORDER — LORAZEPAM 2 MG/ML IJ SOLN
1.0000 mg | INTRAMUSCULAR | Status: DC | PRN
Start: 2017-11-06 — End: 2017-11-06

## 2017-11-06 MED ORDER — SENNA 8.6 MG PO TABS: 1.0000 | ORAL_TABLET | Freq: Every evening | ORAL | Status: DC | PRN

## 2017-11-06 MED ORDER — DIPHENHYDRAMINE HCL 50 MG/ML IJ SOLN: 12.5000 mg | INTRAMUSCULAR | Status: DC | PRN

## 2017-11-06 MED ORDER — ONDANSETRON 4 MG PO TBDP: 4.0000 mg | ORAL_TABLET | Freq: Four times a day (QID) | ORAL | Status: DC | PRN

## 2017-11-06 MED ORDER — ALBUTEROL SULFATE (2.5 MG/3ML) 0.083% IN NEBU: 2.5000 mg | INHALATION_SOLUTION | RESPIRATORY_TRACT | Status: DC | PRN

## 2017-11-06 MED ORDER — ONDANSETRON HCL 4 MG/2ML IJ SOLN
4.0000 mg | Freq: Once | INTRAMUSCULAR | Status: AC
  Administered 2017-11-06: 4 mg via INTRAVENOUS
  Filled 2017-11-06: qty 2

## 2017-11-06 MED ORDER — LOPERAMIDE HCL 2 MG PO CAPS: 2.0000 mg | ORAL_CAPSULE | ORAL | Status: DC | PRN

## 2017-11-06 MED ORDER — ATROPINE SULFATE 1 % OP SOLN
4.0000 [drp] | OPHTHALMIC | Status: DC | PRN
  Filled 2017-11-06: qty 2

## 2017-11-06 MED ORDER — ONDANSETRON HCL 4 MG/2ML IJ SOLN: 4.0000 mg | Freq: Four times a day (QID) | INTRAMUSCULAR | Status: DC | PRN

## 2017-11-06 MED ORDER — OXYBUTYNIN CHLORIDE 5 MG PO TABS: 2.5000 mg | ORAL_TABLET | Freq: Four times a day (QID) | ORAL | Status: DC | PRN

## 2017-11-06 NOTE — ED Provider Notes (Signed)
[facility]-EMERGENCY DEPT Provider Note   CSN: [id number] Arrival date & time: 12/01/2017  1254     History   Chief Complaint Chief Complaint  Patient presents with  . Seizures    HPI Travis Sampson is a 61 y.o. male.  HPI 61 year old male with a history of Down syndrome and dementia who presents with a most form demonstrating comfort measures only.  He was brought to the emergency department after reported 2 seizures today and a possible event where he vomited and aspirated.  He presents to the emergency department hypoxic and hypotensive.  He has outlived both of his parents as well as his 2 siblings.  His healthcare power of attorney is present and requesting comfort measures only given his poor quality of life over the past several months.   Past Medical History:  Diagnosis Date  . Alzheimer disease   . Dementia   . Down's syndrome   . Hyperlipidemia   . Hypothyroidism   . Macrocytosis 2007   MCV 104 w/o anemia  . SOB (shortness of breath)     Patient Active Problem List   Diagnosis Date Noted  . CAP (community acquired pneumonia) 01/31/2017  . Dementia due to Alzheimer's disease 01/31/2017  . Chest pain 01/31/2017  . Unsteady gait 12/04/2016  . Bilateral leg edema 12/04/2016  . Raynaud's phenomenon 08/31/2014  . DOE (dyspnea on exertion) 07/30/2014  . Bradycardia 07/30/2014  . Macrocytosis without anemia 01/03/2014  . Syncope 07/14/2013  . Peripheral neuropathy 07/14/2013  . Memory loss 09/29/2012  . Abnormal ECG 10/16/2011  . Hyperglycemia 07/01/2009  . Hypothyroidism 06/16/2007  . Hyperlipidemia 06/16/2007  . DOWN SYNDROME 06/16/2007    Past Surgical History:  Procedure Laterality Date  . CATARACT EXTRACTION, BILATERAL     Dr Wilkie Ayeigby/ [facility]  . COLONOSCOPY  2007   internal hemorrhoids  . MULTIPLE TOOTH EXTRACTIONS    . TONSILLECTOMY         Home Medications    Prior to Admission medications   Medication Sig Start Date  End Date Taking? Authorizing Provider  aspirin 81 MG chewable tablet Chew 81 mg by mouth daily.   Yes [provider]  barrier cream (NON-SPECIFIED) CREA Apply 1 application topically 3 (three) times daily as needed (for redness on buttock).   Yes [provider]  bisacodyl (DULCOLAX) 10 MG suppository Place 1 suppository (10 mg total) rectally daily as needed for moderate constipation. 02/11/17  Yes Ghimire, Werner LeanShanker M, MD  divalproex (DEPAKOTE SPRINKLE) 125 MG capsule Take 500 mg by mouth 3 (three) times daily.   Yes [provider]  Eyelid Cleansers (OCUSOFT LID SCRUB) PADS Apply 1 each topically daily. RX'ed by Dr.Digby   Yes [provider]  latanoprost (XALATAN) 0.005 % ophthalmic solution Place 1 drop into both eyes at bedtime.   Yes [provider]  levothyroxine (SYNTHROID, LEVOTHROID) 75 MCG tablet Take 1 tablet (75 mcg total) by mouth daily. 02/08/17  Yes Ghimire, Werner LeanShanker M, MD  Multiple Vitamin (MULTIVITAMIN) tablet Take 1 tablet by mouth daily.   Yes [provider]  Ophthalmic Irrigation Solution (OCUSOFT EYE WASH OP) Apply to eye. At bedtime   Yes [provider]  OXYGEN Inhale 2-3 L into the lungs as needed (for shortness of breath).   Yes [provider]  polyethylene glycol (MIRALAX / GLYCOLAX) packet Take 17 g by mouth daily. 02/11/17  Yes Ghimire, Werner LeanShanker M, MD  sertraline (ZOLOFT) 50 MG tablet Take 50 mg by  mouth daily.   Yes [provider]  furosemide (LASIX) 40 MG tablet Take 1 tablet (40 mg total) by mouth daily. Patient not taking: Reported on 11/08/2017 02/08/17   Maretta Bees, MD  LORazepam (ATIVAN) 2 MG/ML concentrated solution Take 0.3 mLs (0.6 mg total) by mouth every 8 (eight) hours. November 08, 2017   Azalia Bilis, MD  morphine (ROXANOL) 20 MG/ML concentrated solution Take 0.25 mLs (5 mg total) by mouth every 2 (two) hours as needed for severe pain, anxiety or shortness of breath. 11/08/17   Azalia Bilis, MD  potassium chloride 20 MEQ TBCR Take 8 mEq by mouth daily. Patient not taking: Reported on 08-Nov-2017 02/08/17   Maretta Bees, MD  senna-docusate (SENOKOT-S) 8.6-50 MG tablet Take 2 tablets by mouth at bedtime as needed for mild constipation. Patient not taking: Reported on 11-08-2017 02/11/17   Maretta Bees, MD    Family History Family History  Problem Relation Age of Onset  . Stroke Father   . Lung cancer Sister        smoker  . Diabetes Neg Hx   . Heart disease Neg Hx     Social History Social History   Tobacco Use  . Smoking status: Never Smoker  . Smokeless tobacco: Never Used  Substance Use Topics  . Alcohol use: No  . Drug use: No     Allergies   Donepezil   Review of Systems Review of Systems  Unable to perform ROS: Dementia     Physical Exam Updated Vital Signs BP 114/90   Pulse 79   Temp (!) 97.4 F (36.3 C) (Axillary)   Resp (!) 34   SpO2 (!) 57%   Physical Exam  Constitutional: He appears well-developed and well-nourished.  HENT:  Head: Normocephalic and atraumatic.  Eyes: EOM are normal.  Neck: Neck supple.  Cardiovascular: Normal rate and regular rhythm.  Pulmonary/Chest:  Tachypnea.  Rhonchi bilaterally.  Mild accessory muscle use.  Abdominal: Soft. He exhibits no distension.  Musculoskeletal: Normal range of motion.  Neurological: He is alert.  Skin: Skin is warm and dry.  Nursing note and vitals reviewed.    ED Treatments / Results  Labs (all labs ordered are listed, but only abnormal results are displayed) Labs Reviewed  CBC - Abnormal; Notable for the following components:      Result Value   RBC 3.75 (*)    MCV 107.2 (*)    MCH 36.8 (*)    All other components within normal limits  BASIC METABOLIC PANEL - Abnormal; Notable for the following components:   Glucose, Bld 123 (*)    Calcium 8.4 (*)    All other components within normal limits  VALPROIC ACID LEVEL  INFLUENZA PANEL BY PCR (TYPE A & B)     EKG  EKG Interpretation None       Radiology Dg Chest Portable 1 View  Result Date: 2017/11/08 CLINICAL DATA:  Recent seizure activity with hypoxia EXAM: PORTABLE CHEST 1 VIEW COMPARISON:  02/06/2017 FINDINGS: Cardiac shadow is enlarged but stable. Aortic calcifications are again seen. Patchy bibasilar infiltrates are noted slightly increased from the prior exam likely representing some acute on chronic infiltrate. No acute bony abnormality is seen. IMPRESSION: Bibasilar infiltrates somewhat increased from the prior exam. This likely represents a degree of acute on chronic infiltrate. Electronically Signed   By: Alcide Clever M.D.   On: 08-Nov-2017 13:55    Procedures Procedures (including critical care time)  Medications Ordered in ED  Medications  ondansetron (ZOFRAN) injection 4 mg (4 mg Intravenous Given 11/29/2017 1644)  LORazepam (ATIVAN) injection 1 mg (1 mg Intravenous Given 11/04/2017 1645)  morphine 4 MG/ML injection 4 mg (4 mg Intravenous Given 11/17/2017 1644)     Initial Impression / Assessment and Plan / ED Course  I have reviewed the triage vital signs and the nursing notes.  Pertinent labs & imaging results that were available during my care of the patient were reviewed by me and considered in my medical decision making (see chart for details).     Long discussion had with patient's healthcare power of attorney.  I follow the instructions on the most form.  Healthcare power of attorney understands the gravity of the situation.  Their focus fully on comfort.  I think this is reasonable given the patient's medical comorbidities, dementia, Down syndrome and poor quality of life.  Morphine and Ativan given for comfort measures here in the emergency department.  Plan will be comfort here in the ER and as long as comfort can be achieved we will plan to discharge him back to his skilled nursing facility with oral morphine and oral Ativan.  [facility] and  they aware of the patient.  [facility] states that they will need a direct referral from the skilled nursing facility.  This was all described to the healthcare power of attorney.  If we are unable to control his symptoms while here in the emergency department we will admit for palliative measures.  Final Clinical Impressions(s) / ED Diagnoses   Final diagnoses:  HCAP (healthcare-associated pneumonia)  Acute aspiration pneumonia (HCC)  Hypoxia  Hypotension, unspecified hypotension type  End of life care    ED Discharge Orders        Ordered    morphine (ROXANOL) 20 MG/ML concentrated solution  Every 2 hours PRN     11-13-17 1628    LORazepam (ATIVAN) 2 MG/ML concentrated solution  Every 8 hours     11-13-17 1628       Azalia Bilisampos, Kwinton Maahs, MD 11-13-17 1655

## 2017-11-06 NOTE — ED Notes (Signed)
edh     

## 2017-11-06 NOTE — ED Triage Notes (Signed)
Witnessed seizure at [facility] which lasted approximately 1 minute. No other descriptors provided per EMS. EMS placed on NRB due to low O2 saturation, remainder of vital signs WDL. Hx of MR, hx of seizures. Patient is non-verbal which is baseline. DNR form at bedside.

## 2017-11-06 NOTE — ED Notes (Signed)
Pt with sats in 60's with snoring respirations noted not responsive call light in reach awaiting to call report to floor doctor informed pt sats states pt is a DNR.

## 2017-11-06 NOTE — ED Notes (Signed)
ED Provider at bedside. 

## 2017-11-06 NOTE — ED Notes (Signed)
ED TO INPATIENT HANDOFF REPORT  Name/Age/Gender Travis Sampson 61 y.o. male  Code Status    Code Status Orders  (From admission, onward)        Start     Ordered   11/11/2017 2007  Do not attempt resuscitation (DNR)  Continuous    Question Answer Comment  In the event of cardiac or respiratory ARREST Do not call a "code blue"   In the event of cardiac or respiratory ARREST Do not perform Intubation, CPR, defibrillation or ACLS   In the event of cardiac or respiratory ARREST Use medication by any route, position, wound care, and other measures to relive pain and suffering. May use oxygen, suction and manual treatment of airway obstruction as needed for comfort.      11/04/2017 2009    Code Status History    Date Active Date Inactive Code Status Order ID Comments User Context   01/31/2017 12:30 02/11/2017 17:01 Full Code [id number]  [facility] Ou ED    Advance Directive Documentation     Most Recent Value  Type of Advance Directive  Out of facility DNR (pink MOST or yellow form)  Pre-existing out of facility DNR order (yellow form or pink MOST form)  No data  "MOST" Form in Place?  No data      Home/SNF/Other Skilled nursing facility  Chief Complaint seizure  Level of Care/Admitting Diagnosis ED Disposition    ED Disposition Condition [facility] Area: [facility] [100102]  Level of Care: Med-Surg [16]  Diagnosis: Hypoxia [427062]  Admitting Physician: Gerlean Ren [facility] [[id number]]  Attending Physician: Gerlean Ren [facility] [[id number]]  Estimated length of stay: past midnight tomorrow  Certification:: I certify this patient will need inpatient services for at least 2 midnights  PT Class (Do Not Modify): Inpatient [101]  PT Acc Code (Do Not Modify): Private [1]       Medical History Past Medical History:  Diagnosis Date  . Alzheimer disease   . Dementia   . Down's syndrome   . Hyperlipidemia   . Hypothyroidism   .  Macrocytosis 2007   MCV 104 w/o anemia  . SOB (shortness of breath)     Allergies Allergies  Allergen Reactions  . Donepezil     [facility] reaction [facility]     IV Location/Drains/Wounds Patient Lines/Drains/Airways Status   Active Line/Drains/Airways    Name:   Placement date:   Placement time:   Site:   Days:   Peripheral IV 11/30/2017 Right Hand   11/26/2017    1644    Hand   less than 1   External Urinary Catheter   02/05/17    1537    -   274          Labs/Imaging Results for orders placed or performed during the hospital encounter of 11/23/2017 (from the past 48 hour(s))  CBC     Status: Abnormal   Collection Time: 11/20/2017  1:31 PM  Result Value Ref Range   WBC 9.8 4.0 - 10.5 K/uL   RBC 3.75 (L) 4.22 - 5.81 MIL/uL   Hemoglobin 13.8 13.0 - 17.0 g/dL   HCT 40.2 39.0 - 52.0 %   MCV 107.2 (H) 78.0 - 100.0 fL   MCH 36.8 (H) 26.0 - 34.0 pg   MCHC 34.3 30.0 - 36.0 g/dL   RDW 15.4 11.5 - 15.5 %   Platelets 176 150 - 400 K/uL  Basic metabolic panel  Status: Abnormal   Collection Time: 11/14/2017  1:31 PM  Result Value Ref Range   Sodium 138 135 - 145 mmol/L   Potassium 4.2 3.5 - 5.1 mmol/L   Chloride 103 101 - 111 mmol/L   CO2 28 22 - 32 mmol/L   Glucose, Bld 123 (H) 65 - 99 mg/dL   BUN 16 6 - 20 mg/dL   Creatinine, Ser 0.78 0.61 - 1.24 mg/dL   Calcium 8.4 (L) 8.9 - 10.3 mg/dL   GFR calc non Af Amer >60 >60 mL/min   GFR calc Af Amer >60 >60 mL/min    Comment: (NOTE) The eGFR has been calculated using the CKD EPI equation. This calculation has not been validated in all clinical situations. eGFR's persistently <60 mL/min signify possible Chronic Kidney Disease.    Anion gap 7 5 - 15  Valproic acid level     Status: None   Collection Time: 11/10/2017  1:33 PM  Result Value Ref Range   Valproic Acid Lvl 60 50.0 - 100.0 ug/mL  Influenza panel by PCR (type A & B)     Status: None   Collection Time: 11/27/2017  2:07 PM  Result Value Ref Range   Influenza A By PCR NEGATIVE  NEGATIVE   Influenza B By PCR NEGATIVE NEGATIVE    Comment: (NOTE) The Xpert Xpress Flu assay is intended as an aid in the diagnosis of  influenza and should not be used as a sole basis for treatment.  This  assay is FDA approved for nasopharyngeal swab specimens only. Nasal  washings and aspirates are unacceptable for Xpert Xpress Flu testing.    Dg Chest Portable 1 View  Result Date: 11/20/2017 CLINICAL DATA:  Recent seizure activity with hypoxia EXAM: PORTABLE CHEST 1 VIEW COMPARISON:  02/06/2017 FINDINGS: Cardiac shadow is enlarged but stable. Aortic calcifications are again seen. Patchy bibasilar infiltrates are noted slightly increased from the prior exam likely representing some acute on chronic infiltrate. No acute bony abnormality is seen. IMPRESSION: Bibasilar infiltrates somewhat increased from the prior exam. This likely represents a degree of acute on chronic infiltrate. Electronically Signed   By: Inez Catalina M.D.   On: 12/01/2017 13:55    Pending Labs Unresulted Labs (From admission, onward)   None      Vitals/Pain Today's Vitals   11/08/2017 1945 11/04/2017 2000 11/15/2017 2015 11/08/2017 2037  BP: (!) 78/51 (!) 77/57 (!) 76/53 (!) 80/56  Pulse: 95 96 92 94  Resp: '20 20 19 20  '$ Temp:      TempSrc:      SpO2: (!) 61% (!) 60% (!) 68% (!) 60%    Isolation Precautions No active isolations  Medications Medications  acetaminophen (TYLENOL) tablet 650 mg (not administered)    Or  acetaminophen (TYLENOL) suppository 650 mg (not administered)  morphine CONCENTRATE 10 MG/0.5ML oral solution 5 mg (not administered)    Or  morphine CONCENTRATE 10 MG/0.5ML oral solution 5 mg (not administered)  morphine 2 MG/ML injection 1 mg (not administered)  LORazepam (ATIVAN) tablet 1 mg (not administered)    Or  LORazepam (ATIVAN) 2 MG/ML concentrated solution 1 mg (not administered)    Or  LORazepam (ATIVAN) injection 1 mg (not administered)  diphenhydrAMINE (BENADRYL) injection 12.5  mg (not administered)  senna (SENOKOT) tablet 8.6 mg (not administered)  oxybutynin (DITROPAN) tablet 2.5 mg (not administered)  loperamide (IMODIUM) capsule 2 mg (not administered)  ondansetron (ZOFRAN-ODT) disintegrating tablet 4 mg (not administered)    Or  ondansetron (  ZOFRAN) injection 4 mg (not administered)  atropine 1 % ophthalmic solution 4 drop (not administered)  chlorproMAZINE (THORAZINE) 12.5 mg in sodium chloride 0.9 % 25 mL IVPB (not administered)  LORazepam (ATIVAN) injection 1 mg (not administered)  albuterol (PROVENTIL) (2.5 MG/3ML) 0.083% nebulizer solution 2.5 mg (not administered)  antiseptic oral rinse (BIOTENE) solution 15 mL (not administered)  polyvinyl alcohol (LIQUIFILM TEARS) 1.4 % ophthalmic solution 1 drop (not administered)  ondansetron (ZOFRAN) injection 4 mg (4 mg Intravenous Given 11/26/2017 1644)  LORazepam (ATIVAN) injection 1 mg (1 mg Intravenous Given 11/21/2017 1645)  morphine 4 MG/ML injection 4 mg (4 mg Intravenous Given 11/16/2017 1644)    Mobility non-ambulatory

## 2017-11-06 NOTE — Progress Notes (Addendum)
CSW received consult for pt. CSW spoke with EDP. EDP informed CSW that pt's legal guardian is interested in pt receiving comfort care. [facility]. Pt's legal guardian expressed to the EDP wanting pt to go [facility] or remain in the hospital to receive comfort care.    CSW called and spoke with Zella BallRobin, pt's legal guardian, at [phone]. CSW spoke with Zella BallRobin about if pt is cleared to be discharged, what those plans would be. CSW explained that the pt needs to be seen as appropriate for palliative care to be referred to [facility]. CSW explained that if deemed appropriate for hospice, a bed [facility] might not be available and would she be open to other options. Legal guardian stated she would be.  CSW asked pt's legal guardian if client is not deemed appropriate for hospice care would she be agreeable to him returning to [facility]. Pt's legal guardian informed CSW that [facility] can [facility].   Plan: Continue to follow pt and his plan of care.

## 2017-11-06 NOTE — ED Provider Notes (Signed)
Received care from Dr. Patria Maneampos.  Patient 61yo with dementia and Down Syndrome, and POA would like full comfort care which both Dr. Patria Maneampos and I are in agreement with. Given pain and anxiety medications to help patient stay comfortable.   He appears more comfortable on reevaluation. He has blood pressures in 80s systolic and oxygen saturation in 50s on nonrebreather. Observed in ED with continued stability of these vital signs. Discussed with POA possibility of [facility] and discussed with Nurse [facility] who was comfortable with this. However, POA Zella BallRobin is concerned regarding patient being uncomfortable and alone at time of death if he were to be discharged to [facility] and feels more active nursing care to reevaluate his comfort level during this time would be more appropriate and is requesting admission to hospital or [facility]. We are unable to coordinate admission [facility] at this time, discussed with CSW.  Given family concerns of facility not being able to provide the comfort needed to him at this time, it is not unreasonable to admit patient for comfort measures only. Pt admitted to observation for comfort measures.    Alvira MondaySchlossman, Prudencio Velazco, MD 11/11/2017 1310

## 2017-11-06 NOTE — Clinical Social Work Note (Signed)
Clinical Social Work Assessment  Patient Details  Name: Travis Sampson MRN: [id number] Date of Birth: 04/29/1956  Date of referral:  11/03/2017               Reason for consult:  End of Life/Hospice                Permission sought to share information with:  Facility Industrial/product designerContact Representative Permission granted to share information::  Yes, Verbal Permission Granted  Name::        Agency::     Relationship::     Contact Information:     Housing/Transportation Living arrangements for the past 2 months:  Assisted Living Facility Source of Information:  Guardian Patient Interpreter Needed:    Criminal Activity/Legal Involvement Pertinent to Current Situation/Hospitalization:    Significant Relationships:  Other Family Members Lives with:  Facility Resident Do you feel safe going back to the place where you live?    Need for family participation in patient care:  Yes (Comment)  Care giving concerns:  CSW consulted for pt being admitted from a facility. Pt's legal guardian expressed concerns about pt receiving the best care and remaining comfortable at the end of his life.    Social Worker assessment / plan:  CSW spoke with pt's legal guardian. Pt's legal guardian expressed concerns about [facility]. Pt's legal guardian, Travis Sampson, stated that she would like for the pt to go to [facility], if pt is appropriate for hospice care, or admitted into the hospital. Travis Sampson agreed to the pt returning [facility] if pt is not appropriate for hospice care or is not admitted into the hospital.   Employment status:  Disabled (Comment on whether or not currently receiving Disability) Insurance information:  Medicare PT Recommendations:  Not assessed at this time Information / Referral to community resources:     Patient/Family's Response to care:  Pt's family was agreeable to the plan of care.   Patient/Family's Understanding of and Emotional Response to Diagnosis, Current Treatment,  and Prognosis:  Family understands current plan of care. Pt's family expressed concerns about pt remaining comfortable and being well cared for at the end of his life.   Emotional Assessment Appearance:    Attitude/Demeanor/Rapport:    Affect (typically observed):    Orientation:    Alcohol / Substance use:    Psych involvement (Current and /or in the community):  No (Comment)  Discharge Needs  Concerns to be addressed:  Care Coordination Readmission within the last 30 days:  No Current discharge risk:  Terminally ill Barriers to Discharge:  No Barriers Identified   Montine CircleKelsy Deandre Brannan, LCSW 11/05/2017, 8:48 PM

## 2017-11-06 NOTE — ED Notes (Signed)
Bed: RESB Expected date:  Expected time:  Means of arrival:  Comments: EMS/seizure/<L.O.C. DNR

## 2017-11-06 NOTE — Discharge Instructions (Signed)
Please contact [facility] with a REFERRAL in the morning

## 2017-11-06 NOTE — ED Notes (Signed)
Patient now presents with tachypnea, tachycardia, and an SPO2 of 57%. Family at bedside and charge nurse and provider made aware. Warm blankets applied and distractions minimized. Will continue to monitor. NRB remains in place.

## 2017-11-06 NOTE — H&P (Signed)
History and Physical    Travis Sampson ZOX:[id number] DOB: 09/14/1956 DOA: 12-06-2017  PCP: Pincus Sanes, MD Patient [facility] from[facility]   Chief Complaint: seizures   HPI: Travis Sampson is a 61 y.o. male with medical history significant of Downs syndrome, dementia, alzheimers disease, HLD, Hypothyroidism sent for evaluation of seizures.  Hx if limited as patient is verbally unresponsive and family/POA is not at the bedside at this time. Per ED Physician, Dr Dalene Seltzer patient was brought to the hospital due to witnessed multiple seizures at the nursing facility. Apparently he had also vomited and there was concerns of aspiration. In the ED patient was noted to be hypoxia with low BP and tachycardia. Family does not want to go back to that nursing home at this time as they do not think patient is made appropriately comfortable there.  When I saw the patient he was hypotensive with SBP 70s; Sat 60& and HR ~110. He was unresponsive.    Review of Systems: As per HPI otherwise 10 point review of systems negative.   Past Medical History:  Diagnosis Date  . Alzheimer disease   . Dementia   . Down's syndrome   . Hyperlipidemia   . Hypothyroidism   . Macrocytosis 2007   MCV 104 w/o anemia  . SOB (shortness of breath)     Past Surgical History:  Procedure Laterality Date  . CATARACT EXTRACTION, BILATERAL     Dr Wilkie Aye  . COLONOSCOPY  2007   internal hemorrhoids  . MULTIPLE TOOTH EXTRACTIONS    . TONSILLECTOMY       reports that  has never smoked. he has never used smokeless tobacco. He reports that he does not drink alcohol or use drugs.  Allergies  Allergen Reactions  . Donepezil     Unknown reaction per [facility]     Family History  Problem Relation Age of Onset  . Stroke Father   . Lung cancer Sister        smoker  . Diabetes Neg Hx   . Heart disease Neg Hx     Acceptable: Family history reviewed and not pertinent (If you reviewed it)  Prior  to Admission medications   Medication Sig Start Date End Date Taking? Authorizing Provider  aspirin 81 MG chewable tablet Chew 81 mg by mouth daily.   Yes [provider]  barrier cream (NON-SPECIFIED) CREA Apply 1 application topically 3 (three) times daily as needed (for redness on buttock).   Yes [provider]  bisacodyl (DULCOLAX) 10 MG suppository Place 1 suppository (10 mg total) rectally daily as needed for moderate constipation. 02/11/17  Yes Ghimire, Werner Lean, MD  divalproex (DEPAKOTE SPRINKLE) 125 MG capsule Take 500 mg by mouth 3 (three) times daily.   Yes [provider]  Eyelid Cleansers (OCUSOFT LID SCRUB) PADS Apply 1 each topically daily. RX'ed by Dr.Digby   Yes [provider]  latanoprost (XALATAN) 0.005 % ophthalmic solution Place 1 drop into both eyes at bedtime.   Yes [provider]  levothyroxine (SYNTHROID, LEVOTHROID) 75 MCG tablet Take 1 tablet (75 mcg total) by mouth daily. 02/08/17  Yes Ghimire, Werner Lean, MD  Multiple Vitamin (MULTIVITAMIN) tablet Take 1 tablet by mouth daily.   Yes [provider]  Ophthalmic Irrigation Solution (OCUSOFT EYE WASH OP) Apply to eye. At bedtime   Yes [provider]  OXYGEN Inhale 2-3 L into the lungs as needed (for shortness of breath).  Yes [provider]  polyethylene glycol (MIRALAX / GLYCOLAX) packet Take 17 g by mouth daily. 02/11/17  Yes Ghimire, Werner LeanShanker M, MD  sertraline (ZOLOFT) 50 MG tablet Take 50 mg by mouth daily.   Yes [provider]  furosemide (LASIX) 40 MG tablet Take 1 tablet (40 mg total) by mouth daily. Patient not taking: Reported on 11/05/2017 02/08/17   Maretta BeesGhimire, Shanker M, MD  LORazepam (ATIVAN) 2 MG/ML concentrated solution Take 0.3 mLs (0.6 mg total) by mouth every 8 (eight) hours. 11/30/2017   Azalia Bilisampos, Kevin, MD  morphine (ROXANOL) 20 MG/ML concentrated solution Take 0.25 mLs (5 mg total) by mouth every 2 (two) hours as needed for severe  pain, anxiety or shortness of breath. 12/02/2017   Azalia Bilisampos, Kevin, MD  potassium chloride 20 MEQ TBCR Take 8 mEq by mouth daily. Patient not taking: Reported on 11/08/2017 02/08/17   Maretta BeesGhimire, Shanker M, MD  senna-docusate (SENOKOT-S) 8.6-50 MG tablet Take 2 tablets by mouth at bedtime as needed for mild constipation. Patient not taking: Reported on 11/14/2017 02/11/17   Maretta BeesGhimire, Shanker M, MD    Physical Exam: Vitals:   03-24-17 1800 03-24-17 1830 03-24-17 1903 03-24-17 1945  BP: (!) 81/64 (!) 79/53 (!) 83/54 (!) 78/51  Pulse: (!) 105 (!) 101 (!) 101 98  Resp: (!) 24 (!) 23 15 20   Temp:      TempSrc:      SpO2: (!) 59% (!) 60% (!) 59% (!) 61%      Constitutional: verbally unresponsive, laying in the bed with eyes open.  Vitals:   03-24-17 1800 03-24-17 1830 03-24-17 1903 03-24-17 1945  BP: (!) 81/64 (!) 79/53 (!) 83/54 (!) 78/51  Pulse: (!) 105 (!) 101 (!) 101 98  Resp: (!) 24 (!) 23 15 20   Temp:      TempSrc:      SpO2: (!) 59% (!) 60% (!) 59% (!) 61%   Eyes: PERRL, lids and conjunctivae normal ENMT: Mucous membranes are DRY Posterior pharynx clear of any exudate or lesions.Normal dentition.  Neck: normal, supple, no masses, no thyromegaly Respiratory: diffuse coarse Breath sounds  Cardiovascular: tachycardia Regular rate and rhythm, no murmurs / rubs / gallops. 2+ b/l LE  extremity edema. 2+ pedal pulses. No carotid bruits.  Abdomen:  tenderness, no masses palpated. No hepatosplenomegaly. Bowel sounds positive.  Musculoskeletal: no clubbing / cyanosis. No joint deformity upper and lower extremities. Good ROM, no contractures. Normal muscle tone.  Skin: no rashes, lesions, ulcers. No induration Neurologic: unable to assess  Psychiatric: unable to assess    Labs on Admission: I have personally reviewed following labs and imaging studies  CBC: Recent Labs  Lab 03-24-17 1331  WBC 9.8  HGB 13.8  HCT 40.2  MCV 107.2*  PLT 176   Basic Metabolic Panel: Recent Labs  Lab  03-24-17 1331  NA 138  K 4.2  CL 103  CO2 28  GLUCOSE 123*  BUN 16  CREATININE 0.78  CALCIUM 8.4*   GFR: CrCl cannot be calculated (Unknown ideal weight.). Liver Function Tests: No results for input(s): AST, ALT, ALKPHOS, BILITOT, PROT, ALBUMIN in the last 168 hours. No results for input(s): LIPASE, AMYLASE in the last 168 hours. No results for input(s): AMMONIA in the last 168 hours. Coagulation Profile: No results for input(s): INR, PROTIME in the last 168 hours. Cardiac Enzymes: No results for input(s): CKTOTAL, CKMB, CKMBINDEX, TROPONINI in the last 168 hours. BNP (last 3 results) No results for input(s): PROBNP in the last  8760 hours. HbA1C: No results for input(s): HGBA1C in the last 72 hours. CBG: No results for input(s): GLUCAP in the last 168 hours. Lipid Profile: No results for input(s): CHOL, HDL, LDLCALC, TRIG, CHOLHDL, LDLDIRECT in the last 72 hours. Thyroid Function Tests: No results for input(s): TSH, T4TOTAL, FREET4, T3FREE, THYROIDAB in the last 72 hours. Anemia Panel: No results for input(s): VITAMINB12, FOLATE, FERRITIN, TIBC, IRON, RETICCTPCT in the last 72 hours. Urine analysis: No results found for: COLORURINE, APPEARANCEUR, LABSPEC, PHURINE, GLUCOSEU, HGBUR, BILIRUBINUR, KETONESUR, PROTEINUR, UROBILINOGEN, NITRITE, LEUKOCYTESUR Sepsis Labs: !!!!!!!!!!!!!!!!!!!!!!!!!!!!!!!!!!!!!!!!!!!! @LABRCNTIP (procalcitonin:4,lacticidven:4) )No results found for this or any previous visit (from the past 240 hour(s)).   Radiological Exams on Admission: Dg Chest Portable 1 View  Result Date: 12/02/2017 CLINICAL DATA:  Recent seizure activity with hypoxia EXAM: PORTABLE CHEST 1 VIEW COMPARISON:  02/06/2017 FINDINGS: Cardiac shadow is enlarged but stable. Aortic calcifications are again seen. Patchy bibasilar infiltrates are noted slightly increased from the prior exam likely representing some acute on chronic infiltrate. No acute bony abnormality is seen. IMPRESSION:  Bibasilar infiltrates somewhat increased from the prior exam. This likely represents a degree of acute on chronic infiltrate. Electronically Signed   By: Alcide CleverMark  Lukens M.D.   On: 06/23/17 13:55    EKG: Independently reviewed.   Assessment/Plan Active Problems:   Hypoxia   Multiple Seizures Hypoxia, likely from aspiration with b/l infiltrates -admit for comfort care, plans for hospice tomorrow  -comfort care order set started  -prn meds ordered   Chronic B/l LE extremity edema  Down Syndrome  Advanced Alzheimer Dementia  Raynaud's Phenomenon Unsteady gait    DVT prophylaxis: None  Code Status: Comfort Measures  Family Communication: None at bedside at the moment, but will be coming back to the hospital soon per ED staff. Will speak with them if they get here  Disposition Plan: TBD Consults called: Hospice in am  Admission status: Comfort measure, DNR   Rozalyn Osland Joline Maxcyhirag Yaniah Thiemann MD [facility]   If 7PM-7AM, please contact night-coverage [email or web address] Password [email or web address]  11/02/2017, 8:16 PM

## 2017-11-06 NOTE — ED Notes (Signed)
Pt incontinent of stool upon arrival, bed/linen and patient changed.

## 2017-11-07 DIAGNOSIS — J189 Pneumonia, unspecified organism: Secondary | ICD-10-CM

## 2017-12-03 NOTE — Progress Notes (Signed)
PT TIME OF DEATH 0215  DOCUMENTED 0230   11/16/2017 0200  Attending Physican Contact  Attending Physician Notified Y  Attending Physician Name K.KIRBY NP  Will the above attending physician sign death certificate?  (non ED physician) Yes  Post Mortem Checklist  Date of Death 11/02/2017  Time of Death 0215  Pronounced By Wynona MealsJENEBA JALLOH,RN [facility](Sila Sarsfield, RN)  Next of kin notified Yes  Name of next of kin notified of death Ursula AlertRobin Stiles  Contact Person's Relationship to Patient Other (Comment) (Legal Guardine)  Contact Person's Phone Number [phone]  Was the patient a No Code Blue or a Limited Code Blue? Yes  Did the patient die unattended? Yes  Patient restrained? Not applicable  Body preparation complete Y  [place] [facility]  Notification Date 11/15/2017  Notification Time 0236  [place] [facility] Number [id number]  Is patient a potential donor? N  Patient Belongings/Medications Returned  Patient belongings from bedside/safe/pharmacy returned  None  Dead on Arrival (Emergency Department)  Patient dead on arrival? No  Medical Examiner  Is this a medical [facility]'s case? [facility]  [facility]  [place]address[place]phone # [facility] [address] [place]- [phone]  [place]

## 2017-12-03 NOTE — Discharge Summary (Signed)
Physician Discharge Summary  Travis Sampson ZOX:[id number] DOB: 08/28/1956 DOA: 11/12/2017  PCP: Pincus SanesBurns, Stacy J, MD  Admit date: 11/08/2017 Discharge date: 11/06/2017  Admitted From: NH Disposition: Deceased  Brief/Interim Summary: 62 yo with history of Down syndrome, dementia, Alzheimer's disease, hyperlipidemia, hypothyroidism was sent from the nursing home for evaluation of seizures. At the time of admission history was limited as patient was verbally unresponsive and family was not present at bedside.  History was per ER physician.  Patient was brought to the ER after having witnessed multiple seizures at the nursing home and apparently at that time he also aspirated his vomitus.  In the ER he was noted to be hypotensive with systolic blood pressure in 70s, saturating 60% with heart rate in 110 range.  POA had discussion with the ER physician to keep the patient comfortable otherwise no further interventions.  After being admitted patient was placed on comfort care and overnight patient expired around 2:15 AM on November 07, 2017.  Death certificate was completed by the nurse practitioner.  Discharge Diagnoses:  Active Problems:   Hypoxia  Procedures/Studies: Dg Chest Portable 1 View  Result Date: 11/05/2017 CLINICAL DATA:  Recent seizure activity with hypoxia EXAM: PORTABLE CHEST 1 VIEW COMPARISON:  02/06/2017 FINDINGS: Cardiac shadow is enlarged but stable. Aortic calcifications are again seen. Patchy bibasilar infiltrates are noted slightly increased from the prior exam likely representing some acute on chronic infiltrate. No acute bony abnormality is seen. IMPRESSION: Bibasilar infiltrates somewhat increased from the prior exam. This likely represents a degree of acute on chronic infiltrate. Electronically Signed   By: Alcide CleverMark  Lukens M.D.   On: 01-Apr-2017 13:55       Subjective:   Discharge Exam: Vitals:   11-28-17 2037 11-28-17 2138  BP: (!) 80/56 (!) 82/69  Pulse: 94 88   Resp: 20 10  Temp:  (!) 97 F (36.1 C)  SpO2: (!) 60% (!) 49%   Vitals:   11-28-17 2000 11-28-17 2015 11-28-17 2037 11-28-17 2138  BP: (!) 77/57 (!) 76/53 (!) 80/56 (!) 82/69  Pulse: 96 92 94 88  Resp: 20 19 20 10   Temp:    (!) 97 F (36.1 C)  TempSrc:    Axillary  SpO2: (!) 60% (!) 68% (!) 60% (!) 49%  Weight:    72 kg (158 lb 11.7 oz)  Height:    5\' 4"  (1.626 m)    Deceased.     The results of significant diagnostics from this hospitalization (including imaging, microbiology, ancillary and laboratory) are listed below for reference.     Microbiology: No results found for this or any previous visit (from the past 240 hour(s)).   Labs: BNP (last 3 results) Recent Labs    02/05/17 0730  BNP 59.5   Basic Metabolic Panel: Recent Labs  Lab 11-28-17 1331  NA 138  K 4.2  CL 103  CO2 28  GLUCOSE 123*  BUN 16  CREATININE 0.78  CALCIUM 8.4*   Liver Function Tests: No results for input(s): AST, ALT, ALKPHOS, BILITOT, PROT, ALBUMIN in the last 168 hours. No results for input(s): LIPASE, AMYLASE in the last 168 hours. No results for input(s): AMMONIA in the last 168 hours. CBC: Recent Labs  Lab 11-28-17 1331  WBC 9.8  HGB 13.8  HCT 40.2  MCV 107.2*  PLT 176   Cardiac Enzymes: No results for input(s): CKTOTAL, CKMB, CKMBINDEX, TROPONINI in the last 168 hours. BNP: Invalid input(s): POCBNP CBG: No results for input(s): GLUCAP  in the last 168 hours. D-Dimer No results for input(s): DDIMER in the last 72 hours. Hgb A1c No results for input(s): HGBA1C in the last 72 hours. Lipid Profile No results for input(s): CHOL, HDL, LDLCALC, TRIG, CHOLHDL, LDLDIRECT in the last 72 hours. Thyroid function studies No results for input(s): TSH, T4TOTAL, T3FREE, THYROIDAB in the last 72 hours.  Invalid input(s): FREET3 Anemia work up No results for input(s): VITAMINB12, FOLATE, FERRITIN, TIBC, IRON, RETICCTPCT in the last 72 hours. Urinalysis No results found for:  COLORURINE, APPEARANCEUR, LABSPEC, PHURINE, GLUCOSEU, HGBUR, BILIRUBINUR, KETONESUR, PROTEINUR, UROBILINOGEN, NITRITE, LEUKOCYTESUR Sepsis Labs Invalid input(s): PROCALCITONIN,  WBC,  LACTICIDVEN Microbiology No results found for this or any previous visit (from the past 240 hour(s)).   Time coordinating discharge: Over 30 minutes  SIGNED:   Dimple NanasAnkit Chirag Amin, MD  [facility] 11/19/2017, 11:41 PM Pager   If 7PM-7AM, please contact night-coverage [email or web address] Password [email or web address]

## 2017-12-03 NOTE — Progress Notes (Signed)
Pt admitted yesterday with seizures and aspiration PNA. Given his hx of Down Syndrome and Alzheimer's, decision was made for comfort care only. Pt expired at [facility]0215 11/30/2017. Pronounced by 2 RNs. Death certificate completed and given to RN.  KJKG, NP [facility]

## 2017-12-03 DEATH — deceased

## 2018-07-21 IMAGING — DX DG CHEST 1V PORT
1 series · 1 of 1 positions shown · non-contrast
Comparison: 02/06/2017

CLINICAL DATA: Recent seizure activity with hypoxia

EXAM:
PORTABLE CHEST 1 VIEW

[chest ap]
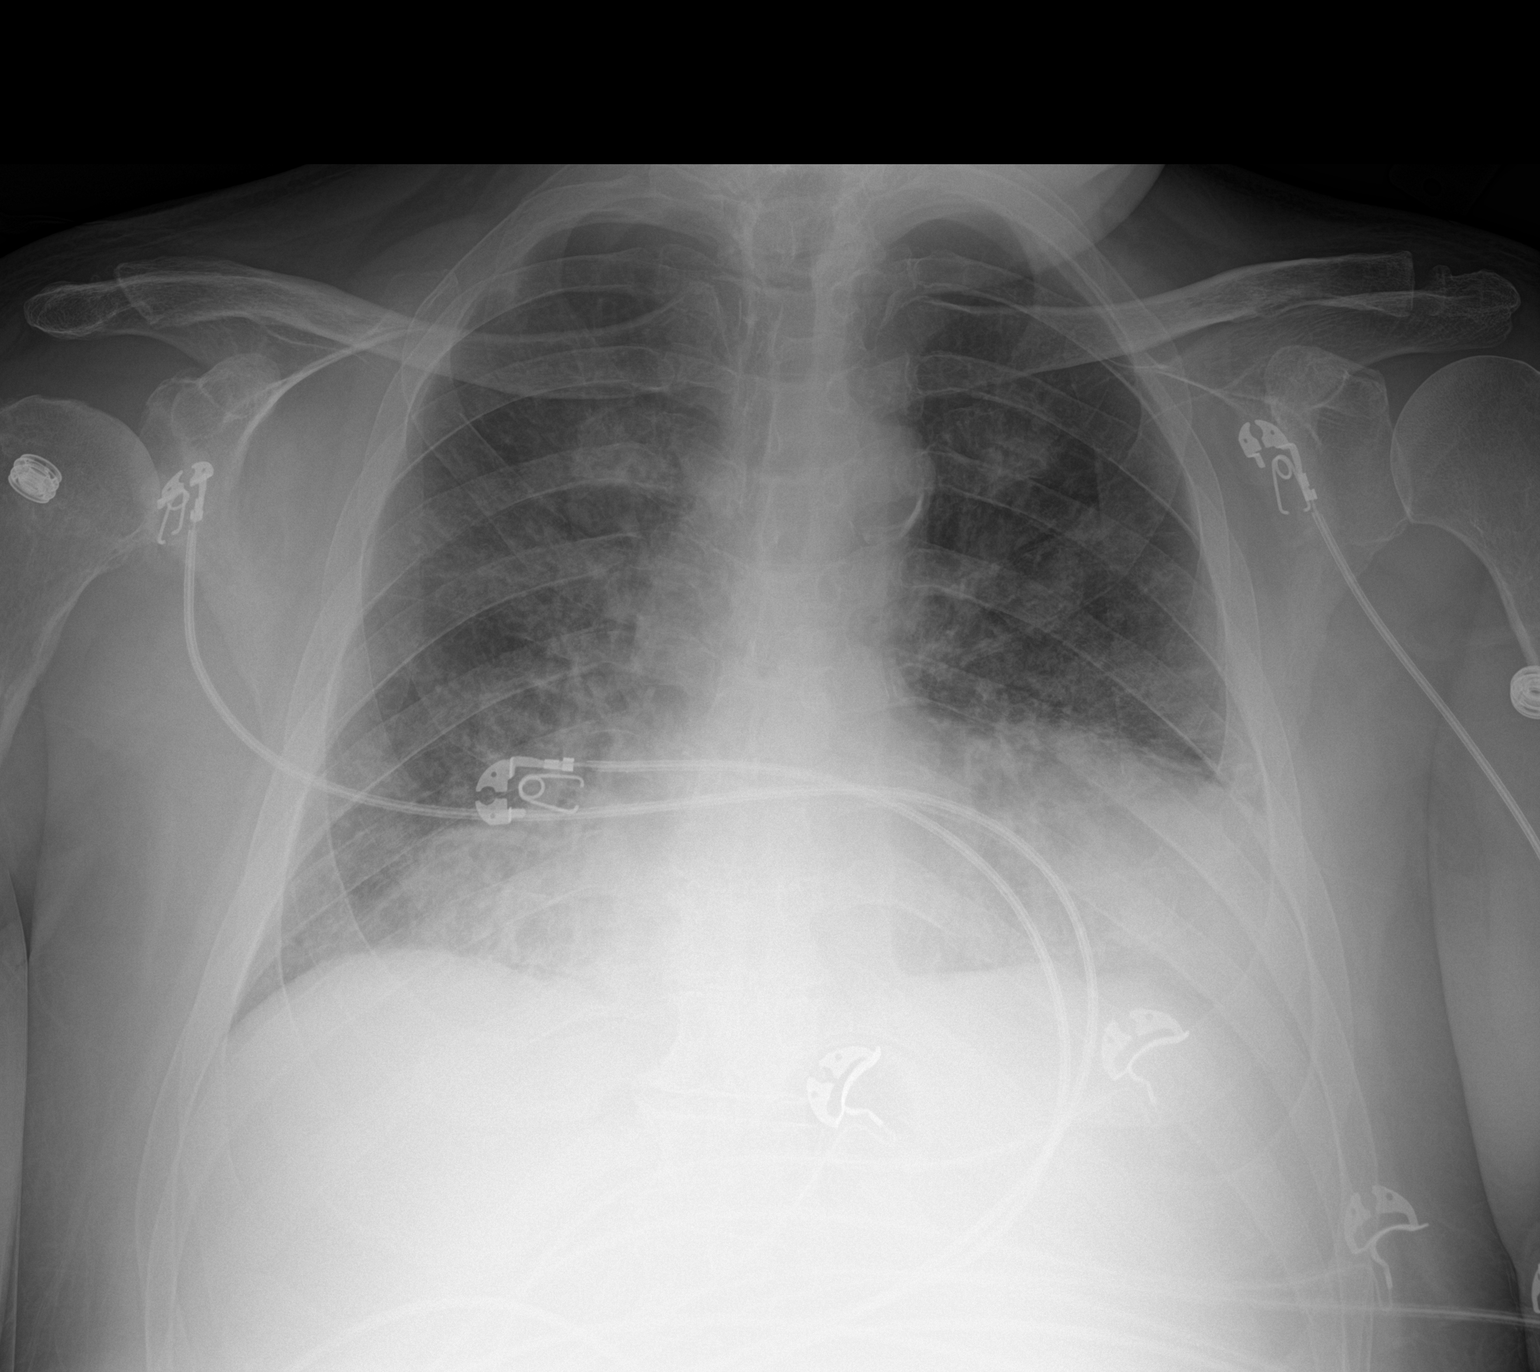

[1 of 1 positions shown; findings below may reference images not displayed]

FINDINGS: Cardiac shadow is enlarged but stable. Aortic calcifications are
again seen. Patchy bibasilar infiltrates are noted slightly
increased from the prior exam likely representing some acute on
chronic infiltrate. No acute bony abnormality is seen.
IMPRESSION: Bibasilar infiltrates somewhat increased from the prior exam. This
likely represents a degree of acute on chronic infiltrate.
